# Patient Record
Sex: Female | Born: 1955 | Race: White | Hispanic: No | Marital: Single | Smoking: Never smoker
Health system: Southern US, Community
[De-identification: ages and names within clinical notes are randomized; demographics above are authoritative.]

## PROBLEM LIST (undated history)

## (undated) DIAGNOSIS — J45909 Unspecified asthma, uncomplicated: Secondary | ICD-10-CM

## (undated) DIAGNOSIS — J189 Pneumonia, unspecified organism: Secondary | ICD-10-CM

## (undated) DIAGNOSIS — F4389 Other reactions to severe stress: Secondary | ICD-10-CM

## (undated) DIAGNOSIS — K219 Gastro-esophageal reflux disease without esophagitis: Secondary | ICD-10-CM

## (undated) DIAGNOSIS — J209 Acute bronchitis, unspecified: Secondary | ICD-10-CM

## (undated) DIAGNOSIS — F438 Other reactions to severe stress: Secondary | ICD-10-CM

## (undated) DIAGNOSIS — F70 Mild intellectual disabilities: Secondary | ICD-10-CM

## (undated) DIAGNOSIS — D649 Anemia, unspecified: Secondary | ICD-10-CM

## (undated) HISTORY — DX: Other reactions to severe stress: F43.8

## (undated) HISTORY — DX: Pneumonia, unspecified organism: J18.9

## (undated) HISTORY — DX: Gastro-esophageal reflux disease without esophagitis: K21.9

## (undated) HISTORY — DX: Unspecified asthma, uncomplicated: J45.909

## (undated) HISTORY — DX: Mild intellectual disabilities: F70

## (undated) HISTORY — DX: Other reactions to severe stress: F43.89

## (undated) HISTORY — PX: TONSILLECTOMY: SUR1361

## (undated) HISTORY — DX: Acute bronchitis, unspecified: J20.9

## (undated) HISTORY — DX: Anemia, unspecified: D64.9

---

## 2000-06-24 ENCOUNTER — Emergency Department (HOSPITAL_COMMUNITY): Admission: EM | Admit: 2000-06-24 | Discharge: 2000-06-24 | Payer: Self-pay | Admitting: Emergency Medicine

## 2000-06-24 ENCOUNTER — Encounter: Payer: Self-pay | Admitting: Emergency Medicine

## 2001-08-17 ENCOUNTER — Emergency Department (HOSPITAL_COMMUNITY): Admission: EM | Admit: 2001-08-17 | Discharge: 2001-08-17 | Payer: Self-pay

## 2001-10-04 ENCOUNTER — Emergency Department (HOSPITAL_COMMUNITY): Admission: EM | Admit: 2001-10-04 | Discharge: 2001-10-04 | Payer: Self-pay | Admitting: *Deleted

## 2002-07-16 ENCOUNTER — Emergency Department (HOSPITAL_COMMUNITY): Admission: EM | Admit: 2002-07-16 | Discharge: 2002-07-16 | Payer: Self-pay | Admitting: Emergency Medicine

## 2002-07-16 ENCOUNTER — Encounter: Payer: Self-pay | Admitting: Emergency Medicine

## 2002-11-09 ENCOUNTER — Emergency Department (HOSPITAL_COMMUNITY): Admission: EM | Admit: 2002-11-09 | Discharge: 2002-11-09 | Payer: Self-pay | Admitting: Emergency Medicine

## 2002-12-29 ENCOUNTER — Inpatient Hospital Stay (HOSPITAL_COMMUNITY): Admission: EM | Admit: 2002-12-29 | Discharge: 2003-01-01 | Payer: Self-pay

## 2003-01-02 ENCOUNTER — Encounter: Payer: Self-pay | Admitting: *Deleted

## 2003-01-02 ENCOUNTER — Emergency Department (HOSPITAL_COMMUNITY): Admission: EM | Admit: 2003-01-02 | Discharge: 2003-01-02 | Payer: Self-pay | Admitting: *Deleted

## 2003-07-25 ENCOUNTER — Emergency Department (HOSPITAL_COMMUNITY): Admission: EM | Admit: 2003-07-25 | Discharge: 2003-07-25 | Payer: Self-pay | Admitting: Emergency Medicine

## 2003-08-28 ENCOUNTER — Emergency Department (HOSPITAL_COMMUNITY): Admission: EM | Admit: 2003-08-28 | Discharge: 2003-08-28 | Payer: Self-pay | Admitting: *Deleted

## 2003-09-04 ENCOUNTER — Emergency Department (HOSPITAL_COMMUNITY): Admission: EM | Admit: 2003-09-04 | Discharge: 2003-09-04 | Payer: Self-pay

## 2004-02-23 ENCOUNTER — Emergency Department (HOSPITAL_COMMUNITY): Admission: EM | Admit: 2004-02-23 | Discharge: 2004-02-23 | Payer: Self-pay | Admitting: Emergency Medicine

## 2004-05-24 ENCOUNTER — Inpatient Hospital Stay (HOSPITAL_COMMUNITY): Admission: EM | Admit: 2004-05-24 | Discharge: 2004-05-26 | Payer: Self-pay | Admitting: Emergency Medicine

## 2004-11-19 ENCOUNTER — Emergency Department (HOSPITAL_COMMUNITY): Admission: EM | Admit: 2004-11-19 | Discharge: 2004-11-19 | Payer: Self-pay | Admitting: Emergency Medicine

## 2004-11-20 ENCOUNTER — Inpatient Hospital Stay (HOSPITAL_COMMUNITY): Admission: EM | Admit: 2004-11-20 | Discharge: 2004-11-24 | Payer: Self-pay | Admitting: Emergency Medicine

## 2004-12-30 ENCOUNTER — Emergency Department (HOSPITAL_COMMUNITY): Admission: AC | Admit: 2004-12-30 | Discharge: 2004-12-31 | Payer: Self-pay

## 2005-07-01 ENCOUNTER — Ambulatory Visit: Payer: Self-pay | Admitting: Internal Medicine

## 2005-07-29 ENCOUNTER — Emergency Department (HOSPITAL_COMMUNITY): Admission: EM | Admit: 2005-07-29 | Discharge: 2005-07-29 | Payer: Self-pay | Admitting: Emergency Medicine

## 2007-09-25 ENCOUNTER — Inpatient Hospital Stay (HOSPITAL_COMMUNITY): Admission: EM | Admit: 2007-09-25 | Discharge: 2007-09-27 | Payer: Self-pay | Admitting: Emergency Medicine

## 2007-10-02 ENCOUNTER — Ambulatory Visit: Payer: Self-pay | Admitting: Pulmonary Disease

## 2007-10-17 ENCOUNTER — Ambulatory Visit (HOSPITAL_COMMUNITY): Admission: RE | Admit: 2007-10-17 | Discharge: 2007-10-17 | Payer: Self-pay | Admitting: Obstetrics and Gynecology

## 2007-10-26 ENCOUNTER — Ambulatory Visit: Payer: Self-pay | Admitting: Gastroenterology

## 2007-11-13 ENCOUNTER — Ambulatory Visit: Payer: Self-pay | Admitting: Gastroenterology

## 2007-11-16 ENCOUNTER — Ambulatory Visit: Payer: Self-pay | Admitting: Gastroenterology

## 2008-02-18 ENCOUNTER — Emergency Department (HOSPITAL_COMMUNITY): Admission: EM | Admit: 2008-02-18 | Discharge: 2008-02-18 | Payer: Self-pay | Admitting: Emergency Medicine

## 2008-02-20 ENCOUNTER — Telehealth (INDEPENDENT_AMBULATORY_CARE_PROVIDER_SITE_OTHER): Payer: Self-pay | Admitting: *Deleted

## 2008-02-20 DIAGNOSIS — D509 Iron deficiency anemia, unspecified: Secondary | ICD-10-CM

## 2008-02-20 DIAGNOSIS — F70 Mild intellectual disabilities: Secondary | ICD-10-CM | POA: Insufficient documentation

## 2008-02-20 DIAGNOSIS — D649 Anemia, unspecified: Secondary | ICD-10-CM

## 2008-02-20 DIAGNOSIS — J189 Pneumonia, unspecified organism: Secondary | ICD-10-CM | POA: Insufficient documentation

## 2008-02-20 DIAGNOSIS — K219 Gastro-esophageal reflux disease without esophagitis: Secondary | ICD-10-CM | POA: Insufficient documentation

## 2008-02-20 DIAGNOSIS — J45998 Other asthma: Secondary | ICD-10-CM | POA: Insufficient documentation

## 2008-02-20 DIAGNOSIS — F438 Other reactions to severe stress: Secondary | ICD-10-CM | POA: Insufficient documentation

## 2008-02-20 DIAGNOSIS — J209 Acute bronchitis, unspecified: Secondary | ICD-10-CM

## 2008-02-27 ENCOUNTER — Telehealth (INDEPENDENT_AMBULATORY_CARE_PROVIDER_SITE_OTHER): Payer: Self-pay | Admitting: *Deleted

## 2008-03-25 ENCOUNTER — Emergency Department (HOSPITAL_COMMUNITY): Admission: EM | Admit: 2008-03-25 | Discharge: 2008-03-25 | Payer: Self-pay | Admitting: Emergency Medicine

## 2008-03-31 ENCOUNTER — Telehealth (INDEPENDENT_AMBULATORY_CARE_PROVIDER_SITE_OTHER): Payer: Self-pay | Admitting: *Deleted

## 2008-03-31 DIAGNOSIS — J309 Allergic rhinitis, unspecified: Secondary | ICD-10-CM | POA: Insufficient documentation

## 2008-04-01 ENCOUNTER — Encounter: Payer: Self-pay | Admitting: Internal Medicine

## 2008-05-01 ENCOUNTER — Ambulatory Visit: Payer: Self-pay | Admitting: Internal Medicine

## 2008-05-15 ENCOUNTER — Ambulatory Visit: Payer: Self-pay | Admitting: Internal Medicine

## 2008-07-07 ENCOUNTER — Encounter: Payer: Self-pay | Admitting: Internal Medicine

## 2009-02-25 ENCOUNTER — Telehealth (INDEPENDENT_AMBULATORY_CARE_PROVIDER_SITE_OTHER): Payer: Self-pay | Admitting: *Deleted

## 2009-02-26 ENCOUNTER — Ambulatory Visit: Payer: Self-pay | Admitting: Internal Medicine

## 2009-03-05 ENCOUNTER — Telehealth (INDEPENDENT_AMBULATORY_CARE_PROVIDER_SITE_OTHER): Payer: Self-pay | Admitting: *Deleted

## 2009-03-23 ENCOUNTER — Telehealth (INDEPENDENT_AMBULATORY_CARE_PROVIDER_SITE_OTHER): Payer: Self-pay | Admitting: *Deleted

## 2009-03-26 ENCOUNTER — Ambulatory Visit: Payer: Self-pay | Admitting: Internal Medicine

## 2010-01-08 ENCOUNTER — Telehealth: Payer: Self-pay | Admitting: Adult Health

## 2010-01-18 ENCOUNTER — Telehealth (INDEPENDENT_AMBULATORY_CARE_PROVIDER_SITE_OTHER): Payer: Self-pay | Admitting: *Deleted

## 2010-03-25 ENCOUNTER — Ambulatory Visit: Payer: Self-pay | Admitting: Internal Medicine

## 2011-01-27 NOTE — Assessment & Plan Note (Signed)
Summary: f/u 1 yr///kp   CC:  Yearly follow up visit-no complaints..  History of Present Illness:  5/09--Tthis patient returns for follow-up visit, last seen in 2006.  She says she was hospitalized twice with pneumonia and twice with asthma.  At least some of that was just emergency room visits, but she is not  very clear in her description.  Her transportation is limited.  On March 31 she went to the emergency room and was treated with prednisone and bronchodilators.  She is a never smoker with no family doctor, and she lives alone in an apartment.  Her roommate has left.  There was a cat that is gone.  She denies heartburn.  She says she feels fine today.  She likes a Combivent inhaler, and I think uses it too heavily.  We discussed this and we discussed maintenance versus bronchodilator therapy.  She has run out of protonix,  Avelox and Ventolin HFA, which were most recently prescribed medications.  03/27/23, 2010--Presents for an acute office visit. Complains of 4 days of SOB- chest tightness- wheezing , Cough is mainly dry, feels dry w/ intermittent wheezing. Denies chest pain,  orthopnea, hemoptysis, fever, n/v/d, edema.  April 23, 2009- Asthma, allergic rhinitis Felt well after last here, except she finds easier dyspnea on exertion walking long distances. Not much cough or wheeze. Uses rescue inhaler a lot. If she wakes during night and notices wheeze she will use it.    March 25, 2010- Asthma, allergic rhinitis, learning disability Nonsmoker. No hosp or major problems in past year. She remains compliant with Symbicort and uses Combivent rescue inhaler rarely, but both are mainly used on as needed basis.  Denies acute change, cough, phlegm, chest pain, palpitation.  Current Medications (verified): 1)  Combivent 103-18 Mcg/act  Aero (Ipratropium-Albuterol) .... 2 Puffs, 4 Times Daily As Needed 2)  Pantoprazole Sodium 40 Mg  Tbec (Pantoprazole Sodium) .... Take 1 Tablet By Mouth Once A Day 3)   Symbicort 80-4.5 Mcg/act  Aero (Budesonide-Formoterol Fumarate) .... Two Puffs Twice Daily  Allergies (verified): No Known Drug Allergies  Past History:  Past Surgical History: Last updated: 02/20/2008 tonsillectomy  Family History: Last updated: 04/23/2009 Father -died colon cancer Sister has asthma  Social History: Last updated: 03/25/2010 Patient never smoked.  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  lives alone [employer] grad Disabled, not working  Review of Systems      See HPI  The patient denies anorexia, fever, weight loss, weight gain, vision loss, decreased hearing, hoarseness, chest pain, syncope, dyspnea on exertion, peripheral edema, prolonged cough, headaches, hemoptysis, and severe indigestion/heartburn.    Vital Signs:  Patient profile:   55 year old female Height:      62.5 inches Weight:      128.38 pounds BMI:     23.19 O2 Sat:      97 % on Room air Pulse rate:   74 / minute BP sitting:   130 / 84  (right arm) Cuff size:   regular  Vitals Entered By: Reynaldo Minium CMA (March 25, 2010 2:33 PM)  O2 Flow:  Room air  Physical Exam  Additional Exam:  General: A/Ox3; pleasant and cooperative, NAD, seems slow. SKIN: no rash, lesions NODES: no lymphadenopathy HEENT:  [place]/AT, EOM- WNL, Conjuctivae- clear, PERRLA, TM-WNL, Nose- clear, Throat- clear and wnl, Mallampati  II NECK: Supple w/ fair ROM, JVD- none, normal carotid impulses w/o bruits Thyroid- CHEST: Clear to P&A, unlabored, no wheeze or cough HEART: RRR, no m/g/r heard ABDOMEN:  NWG:NFAO, nl pulses, no edema  NEURO: Grossly intact to  observation      Impression & Recommendations:  Problem # 1:  ASTHMA (ICD-493.90) Well controlled. I re-emphasized role of Symbicort as a maintenance med for daily use. I think she drifts into as needed use but manages ok. I want to keep her treatment uncomplicated.  Problem # 2:  ALLERGIC RHINITIS (ICD-477.9) Not yet a major problem this early in the Spring.  Other Orders: Est. Patient Level II ([address])  Patient Instructions: 1)  Please schedule a follow-up appointment in 1 year. 2)  Refill scripts for : 3)  Combivent rescue inhaler: 2 puffs, up to 4 times daily If Needed 4)  Symbicort (red) : 2 puffs then rinse mouth, every morning and every evening  ( Maintenance controller) Prescriptions: SYMBICORT 80-4.5 MCG/ACT  AERO (BUDESONIDE-FORMOTEROL FUMARATE) Two puffs twice daily  #1 x prn   Entered and Authorized by:   [name] MD   Signed by:   [name] MD on 03/25/2010   Method used:   Electronically to        [facility] (retail)       [address]       [place], [place]  [id number]       Ph: [phone]       Fax: [phone]   RxID:   [id number] COMBIVENT 103-18 MCG/ACT  AERO (IPRATROPIUM-ALBUTEROL) 2 puffs, 4 times daily as needed  #1 x prn   Entered and Authorized by:   [name] MD   Signed by:   [name] MD on 03/25/2010   Method used:   Electronically to        [facility] (retail)       [address]       [place], [place]  [id number]       Ph: [phone]       Fax: [phone]   RxID:   [id number]

## 2011-01-27 NOTE — Progress Notes (Signed)
Summary: Protonix refill  Phone Note Call from Patient   Caller: Patient Call For: YOUNG Summary of Call: NEED REFILL FOR PROTONIX [facility] [address] Initial call taken by: Rickard Patience,  January 18, 2010 11:00 AM  Follow-up for Phone Call        Refills sent to pharmacy. Pt aware.Michel Bickers CMA  January 18, 2010 11:23 AM    Prescriptions: PANTOPRAZOLE SODIUM 40 MG  TBEC (PANTOPRAZOLE SODIUM) Take 1 tablet by mouth once a day  #30 Tablet x 3   Entered by:   Michel Bickers CMA   Authorized by:   [name] MD   Signed by:   Michel Bickers CMA on 01/18/2010   Method used:   [facility]        [facility] [address] [facility]       [address]       [place], [place]  [address]       Ph: [phone] or [phone]       Fax: [phone]   RxID:   [id number]

## 2011-01-27 NOTE — Progress Notes (Signed)
Summary: rx  Phone Note Call from Patient Call back at Home Phone [phone]   Caller: Patient Call For: Eris Breck Reason for Call: Talk to Nurse Summary of Call: protonix not strong enough and she says she  is running out.  wants you to call in higher dosage.  Not really sure what pt was saying. Initial call taken by: Eugene Gavia,  January 08, 2010 10:17 AM  Follow-up for Phone Call        from what I understand pt is needing more refills so [facility] Carron Curie CMA  January 08, 2010 10:36 AM     Prescriptions: PANTOPRAZOLE SODIUM 40 MG  TBEC (PANTOPRAZOLE SODIUM) Take 1 tablet by mouth once a day  #30 Tablet x 5   Entered by:   Carron Curie CMA   Authorized by:   [name] MD   Signed by:   Carron Curie CMA on 01/08/2010   Method used:   [facility]  [place] [address] [facility]       [address]       [place], [place]  [address]       Ph: [phone] or [phone]       Fax: [phone]   RxID:   [id number]

## 2011-03-08 ENCOUNTER — Telehealth (INDEPENDENT_AMBULATORY_CARE_PROVIDER_SITE_OTHER): Payer: Self-pay | Admitting: *Deleted

## 2011-03-15 NOTE — Progress Notes (Signed)
Summary: flutter valve---RX sent  Phone Note Call from Patient Call back at Home Phone [phone]   Caller: Patient Call For: young Summary of Call: pt requests an "acapella" (flutter valve) to help her move air in lungs. says this worked well when she was in the hospital as it helped her "burp a lot". [facility] Initial call taken by: Tivis Ringer, CNA,  March 08, 2011 10:58 AM  Follow-up for Phone Call        spoke with pt and she is requesting a new rx for a flutter valve. She staets the one she has is old and is no longer working properly. Pt last seen 02-2010, but she has a f/u set for 04-21-11. Please advise if ok to send rx. Carron Curie CMA  March 08, 2011 11:07 AM   Additional Follow-up for Phone Call Additional follow up Details #1::        I have put script on med list. Ok to send. won't go through electronically Additional Follow-up by: [name] MD,  March 08, 2011 1:01 PM    Additional Follow-up for Phone Call Additional follow up Details #2::    Pt is aware of new RX for flutter valve and directions for use. [facility] [address].Michel Bickers CMA  March 08, 2011 2:32 PM  New/Updated Medications: * FLUTTER VALVE 3 -5 blows through, three times a day to clear phlegm Prescriptions: FLUTTER VALVE 3 -5 blows through, three times a day to clear phlegm  #1 x prn   Entered by:   Michel Bickers CMA   Authorized by:   [name] MD   Signed by:   Michel Bickers CMA on 03/08/2011   Method used:   Telephoned to ...       [facility] (retail)       [address]       [place], [place]  [id number]       Ph: [phone]       Fax: [phone]   RxID:   [id number] FLUTTER VALVE 3 -5 blows through, three times a day to clear phlegm  #1 x prn   Entered and Authorized by:   [name] MD   Signed by:   [name] MD on 03/08/2011   Method used:   Historical   RxID:   [id number]

## 2011-04-19 ENCOUNTER — Encounter: Payer: Self-pay | Admitting: Internal Medicine

## 2011-04-21 ENCOUNTER — Ambulatory Visit: Payer: Self-pay | Admitting: Internal Medicine

## 2011-04-27 ENCOUNTER — Other Ambulatory Visit: Payer: Self-pay | Admitting: Internal Medicine

## 2011-05-10 NOTE — H&P (Signed)
[name] [name], [name] [name]               ACCOUNT NO.:  [id number]   MEDICAL RECORD NO.:  [id number]          PATIENT TYPE:  EMS   LOCATION:  ED                           FACILITY:  [name]   PHYSICIAN:  Lonia Blood, M.D.       DATE OF BIRTH:  [birth year]   DATE OF ADMISSION:  09/24/2007  DATE OF DISCHARGE:                              HISTORY & PHYSICAL   PRIMARY [name] PHYSICIAN:  [name] [name]   CHIEF COMPLAINT:  Dyspnea.   HISTORY OF PRESENT ILLNESS:  [name] [name] is a 55 year old woman with  history of asthma who was brought to the emergency room tonight by her  sister for complaints of shortness of breath, coughing, and chills.  The  patient reported that everything started kind of abruptly on September 24, 2007.  The patient denies any exposure to any chemicals, and she  denies any sick contacts.   PAST MEDICAL HISTORY:  1. Asthma.  2. Mild mental retardation.   HOME MEDICATIONS:  None.   SOCIAL HISTORY:  The patient lives alone.  She is unemployed.  She is  drawing social security disability.  She does smoke cigarettes, does not  drink alcohol.   FAMILY HISTORY:  The patient's mother is alive with hypertension.  The  patient has a sister who is alive and has asthma.  The patient's father  passed away with cancer.   ALLERGIES:  No known drug allergies.   REVIEW OF SYSTEMS:  As per HPI.  All other systems are negative.   PHYSICAL EXAMINATION UPON ADMISSION:  VITAL SIGNS:  Shows the  temperature 102.1, blood pressure 115/74, pulse 119, respirations 24,  saturation 88% on room air.  GENERAL APPEARANCE:  A well-developed, well-nourished woman sitting on  the stretcher currently in no acute distress.  HEENT:  The head appears normocephalic, atraumatic.  Eyes pupils are  equal and round react to light and accommodation.  Extraocular movements  are intact.  Throat is clear.  NECK:  Supple.  No JVD.  CHEST:  Good sounds bilaterally.  No wheezes, no crackles, no rhonchi.  HEART:  Regular rate and rhythm without murmurs, rubs, or gallop.  ABDOMEN:  The patient's abdomen is soft, nontender.  Bowel sounds are  present.  EXTREMITIES:  Lower extremities without edema.  SKIN:  Warm and dry without any rashes.   LABORATORY VALUES:  At the time of admission ABG shows a pH of 7.50,  pCO2 of 28, pO2 of 81, bicarb 21.  White blood cell count 10.7,  hemoglobin 10.7, platelet count 269.  Sodium 138, potassium 3, chloride  105, bicarb 23, BUN 13, creatinine 0.8, calcium 9.3.  Chest x-ray shows  maybe left lower lobe consolidation.   ASSESSMENT/PLAN:  Pneumonia and hyperreactive airway.  [name] [name] will  be admitted to [name].  She will be placed on intravenous  steroids as well as nebulizers and antibiotics. We will choose  ceftriaxone and  azithromycin to cover for typical and atypical  bacterial pathogens. Careful monitoring of the patient's respiratory  status will be done.  Lonia Blood, M.D.  Electronically Signed     SL/MEDQ  D:  09/25/2007  T:  09/25/2007  Job:  [id number]

## 2011-05-10 NOTE — Assessment & Plan Note (Signed)
[facility]                             PULMONARY OFFICE NOTE   NAME:[name] [name] BUITRON                      MRN:          [id number]  DATE:10/02/2007                            DOB:          13-Oct-1956    HISTORY OF PRESENT ILLNESS:  The patient is a 55 -year-old white female  patient of Dr.  Roxy Cedar who has a known history of asthma, allergic  rhinitis and gastroesophageal reflux who presents today for post  hospitalization visit. The patient was admittedSeptember 29, 2008  through September 27, 2007 for left lower lobe pneumonia and asthmatic  bronchitic exacerbation. The patient was started on IV antibiotics and  steroids along with nebulized bronchodilators. She was admitted through  [facility] hospitalists.  The patient's discharge summary is  unfortunately not available in the computer today. The patient reports  that she was discharged on prednisone and antibiotics. She has a couple  of days left. She reports that she is much improved with decreased  cough, congestion and wheezing. The patient denies any hemoptysis,  orthopnea, PND or leg swelling.   PAST MEDICAL HISTORY:  Reviewed.   CURRENT MEDICATIONS:  Reviewed.   PHYSICAL EXAMINATION:  The patient is a pleasant female in no acute  distress. She is afebrile with stable vital signs. O2 saturation is 96%  on room air.  HEENT: Unremarkable.  NECK: Supple without cervical adenopathy. No JVD.  LUNGS: Lung sounds reveal diminished breath sounds in the bases without  any wheezing or crackles.  CARDIAC: Regular rate.  ABDOMEN: Soft and nontender.  EXTREMITIES: Warm without any calf tenderness, cyanosis, clubbing or  edema.   IMPRESSION/PLAN:  Recent hospitalization with a left lower lobe  pneumonia and asthmatic bronchitic exacerbation. The patient is to  finish antibiotic and prednisone. May use Mucinex DM as needed for cough  control. The patient may also use Combivent inhaler as needed.  Prescription refill was given. The patient is to return back with Dr.  Maple Hudson within four weeks with followup chest x-ray or sooner as needed.     Rubye Oaks, NP  Electronically Signed      Clinton D. Maple Hudson, MD, Tonny Bollman, FACP  Electronically Signed   TP/MedQ  DD: 10/02/2007  DT: 10/02/2007  Job #: [id number]

## 2011-05-10 NOTE — Assessment & Plan Note (Signed)
 HEALTHC[facility]                         GASTROENTEROLOGY OFFICE NOTE   NAME:[name], [name] BLINN                      MRN:          [id number]  DATE:10/26/2007                            DOB:          05/31/1956    REASON FOR REFERRAL:  Dr. Earlene Plater asked me to evaluate [name] [name] in  consultation regarding iron deficiency anemia.   HISTORY OF PRESENT ILLNESS:  [name] [name] is a very pleasant 55 year old  who was recently admitted for an asthma exacerbation.  Following that,  she presented for routine outpatient labs performed by Dr. Louanna Raw.  He found her hemoglobin to be 11.4, RBW was slightly elevated, and her  iron was 30, which is low.  Her percent saturation was 7, which is also  quite low.  Her MCV was normal.  Complete metabolic profile was normal.  She has never seen overt GI bleeding.  She recently did FOBT testing, I  believe through her gynecologist, and has not yet heard the results of  those.  She has no dramatic constipation or diarrhea.  She does have  intermittently heavy periods.   REVIEW OF SYSTEMS:  Essentially normal, and is available on her nursing  intake sheet.   PAST MEDICAL HISTORY:  Asthma.   CURRENT MEDICATIONS:  Pantoprazole, Proair, Combivent.   ALLERGIES:  NO KNOWN DRUG ALLERGIES.   SOCIAL HISTORY:  Single.  Lives alone.  Nonsmoker.  Nondrinker.   FAMILY HISTORY:  No colon cancer or colon polyps in family.   PHYSICAL EXAMINATION:  She is 5 feet 3 inches, 136 pounds.  Blood  pressure 98/70.  Pulse 76.  CONSTITUTIONAL:  Generally well appearing.  NEUROLOGIC:  Alert and oriented x3.  EYES:  Extraocular movements intact.  MOUTH:  Oropharynx moist.  No lesions.  NECK:  Supple.  No lymphadenopathy.  CARDIOVASCULAR:  Heart regular rate and rhythm.  LUNGS:  Clear to auscultation bilaterally.  ABDOMEN:  Soft, nontender, non-distended, normal bowel sounds.  EXTREMITIES:  No lower extremity edema.  SKIN:  No rashes or  lesions on visible extremities.   ASSESSMENT AND PLAN:  A 55 year old woman with recent iron deficiency  anemia.   She does have intermittently heavy periods, and that is probably at  least contributing to her iron deficiency.  That being said, I think we  should proceed with full colonoscopy at her soonest convenience to rule  out significant neoplasms in her colon.  She will be due anyway for  routine screening in 4 to 5 months' time when she turns 52.  We will,  therefore, arrange for her to have full colonoscopy performed at her  soonest convenience.  I see no reason for any further blood tests or  imaging studies prior to that.     Rachael Fee, MD  Electronically Signed    DPJ/MedQ  DD: 10/26/2007  DT: 10/27/2007  Job #: [id number]   cc:   Louanna Raw

## 2011-05-13 NOTE — Discharge Summary (Signed)
NAME:  [name] [name], [name] [name]                         ACCOUNT NO.:  [id number]   MEDICAL RECORD NO.:  [id number]                   PATIENT TYPE:  INP   LOCATION:  5737                                 FACILITY:  [facility]   PHYSICIAN:  Corinna L. Lendell Caprice, MD             DATE OF BIRTH:  Sep 12, 1956   DATE OF ADMISSION:  05/23/2004  DATE OF DISCHARGE:  05/26/2004                                 DISCHARGE SUMMARY   DIAGNOSES:  1. Acute bronchitis with asthma exacerbation.  2. Asymptomatic hypotension, resolved.  3. Disabled.   DISCHARGE MEDICATIONS:  1. Azithromycin 250 mg p.o. daily for 2 more days.  2. Prednisone taper.  3. Advair 250/150 one puff b.i.d.  4. Albuterol MDI 2 puffs q.4h. as needed for wheezing or shortness of     breath.  5. Cough suppressant as needed.   FOLLOW UP:  Follow up with primary care physician in once to two weeks.   ACTIVITY:  Ad lib   CONDITION ON DISCHARGE:  Stable.   DIET:  Regular.   HISTORY AND HOSPITAL COURSE:  [name] [name] is a 55 year old mentally retarded  white female who presented to the emergency room with complaints of  shortness of breath, cough, fevers, and chills for several days.  She is a  poor historian and could not give very useful history.  Her primary care  doctor is reportedly Dr. Andria Meuse, although she does not know the first  name or the spelling of the last name. She has a history of asthma and no  smoking history.   On exam, she reportedly has asymptomatic hypotension and hypoxia in the  emergency room, but unfortunately these episodes are not recorded here on  the chart.  However, she did have diffuse expiratory wheezing when Dr.  Tresa Endo evaluated her.  She also had a prolonged expiratory phase.  Chest x-  ray showed bibasilar atelectasis. CT of the chest was negative for PE.  The  pH was 7.51, PO2 70, PCO2 26.  Her potassium was 3.4; otherwise her basic  metabolic panel was unremarkable.  Her CBC was significant for hemoglobin  of  11, hematocrit 33.  Cardiac markers negative, influenza A and B negative.  EKG showed normal sinus rhythm.   The patient was admitted initially the intensive care unit due to the  hypotension and hypoxia.  Apparently she was not symptomatic with this and  looks fairly comfortable.  She was transferred to the floor the day after  admission.  She was initially started on IV azithromycin, and this was  switched to oral azithromycin.  She was started on oxygen, IV fluids, IV  steroids.  The oxygen was weaned off.  IV fluids were stopped without  recurrence of  hypotension.  Also her steroids had been switched to oral formulation.  At  the time of discharge, she was afebrile, had normal oxygen saturation, had  no wheezing,and as feeling much  better, so she was discharged to home in  stable condition.                                                Corinna L. Lendell Caprice, MD    CLS/MEDQ  D:  05/26/2004  T:  05/27/2004  Job:  [id number]

## 2011-05-13 NOTE — Discharge Summary (Signed)
NAMEPEARLEE, ARVIZU               ACCOUNT NO.:  [id number]   MEDICAL RECORD NO.:  [id number]          PATIENT TYPE:  INP   LOCATION:  0382                         FACILITY:  [facility] [place]   PHYSICIAN:  Sherin Quarry, MD      DATE OF BIRTH:  02-01-1956   DATE OF ADMISSION:  11/20/2004  DATE OF DISCHARGE:                                 DISCHARGE SUMMARY   [name] [name] is a 55 year old lady who has mild IQ deficit and past history  of asthma.  She initially presented to the [facility] Emergency Room on  November 26 with dyspnea associated with persistent cough and shortness of  breath.  Prior to being seen by Dr. Ladona Ridgel the patient underwent a CT scan  of the chest and abdomen which showed evidence of bilateral patchy  infiltrates consistent with pneumonia.  The patient's symptoms did not  respond to symptomatic therapy in the emergency room and therefore she was  admitted on November 26.   PHYSICAL EXAMINATION:  (As described by Dr. Derenda Mis.)  VITAL SIGNS:  Temperature 101, blood pressure 107/63, pulse 114,  respirations 32, O2 saturation 97%.  HEENT:  Within normal limits.  CHEST:  Hyperresonant.  There was decreased breath sounds.  There was  decreased air movement with moderate end-expiratory wheezes.  CARDIOVASCULAR:  Normal S1 and S2 without murmurs, rubs, or gallops.  ABDOMEN:  Benign.  NEUROLOGIC:  The patient's cranial nerves, motor, sensory, and cerebellar  testing was normal.  EXTREMITIES:  No evidence of cyanosis or edema.   LABORATORIES:  White count initially 19,100, hemoglobin 10.8.  Sodium 132,  potassium 3.5, creatinine 0.7, BUN 8.  Liver profile was normal.  Urine  pregnancy test was negative.  Urinalysis was normal.  Serial blood cultures  were negative.  Chest x-ray showed minimal segmental atelectasis right base  and infiltrate left lower lobe.  An abdominal ultrasound was done which was  negative.  Apparently, a CT scan of the abdomen and pelvis was done  the  previous day in the emergency room and reportedly these studies were  negative as well.   On admission Dr. Derenda Mis placed the patient on IV fluids in the form  of normal saline 100 mL/hour.  The patient was given albuterol and Atrovent  by nebulization q.4h.  Oxygen was administered to keep O2 saturation 95%.  Azithromycin 500 mg IV q.24h. and Rocephin 1 g IV q.24h. were begun.  Solu-  Medrol 125 mg x1 was given and then the patient was placed on Solu-Medrol 60  mg q.6h.  The patient remained on IV steroids until November 29 when she was  switched to oral prednisone.  At that time she was also switched to oral  antibiotics.  Intravenous fluids were discontinued and the patient ambulated  around the halls several laps with only minimal shortness of breath.  By  November 30 she appeared to be a candidate for discharge.   DISCHARGE DIAGNOSES:  1.  Atypical pneumonia.  2.  History of asthma.  3.  Mild mental retardation.   DISCHARGE MEDICATIONS:  1.  Combivent inhaler  three puffs q.4h. and p.r.n.  2.  Protonix 40 mg daily.  3.  Prednisone 40 mg p.o. daily x3 days, 30 x3, 20 x3, and then stop.  4.  Zithromax 250 mg daily for two additional days.  5.  Ceftin 500 mg b.i.d. for four additional days.   The patient indicates that she has been seeing Dr. Valrie Hart in the  past about her asthma and that she will follow up with his office.   CONDITION ON DISCHARGE:  Good.  Importance of taking all of her medications  was emphasized.  She indicates she will be able to obtain her medications  through use of a Medicaid card.      SY/MEDQ  D:  11/24/2004  T:  11/24/2004  Job:  [id number]   cc:   Durwin Reges., M.D.  [phone] [place]  [place] [address]  Fax: [phone]

## 2011-05-13 NOTE — Discharge Summary (Signed)
   [name], [name] [name]                         ACCOUNT NO.:  [id number]   MEDICAL RECORD NO.:  [id number]                   PATIENT TYPE:  INP   LOCATION:  5021                                 FACILITY:   PHYSICIAN:  Georgianne Fick, M.D.            DATE OF BIRTH:  1956-01-23   DATE OF ADMISSION:  12/29/2002  DATE OF DISCHARGE:  01/01/2003                                 DISCHARGE SUMMARY   ADMISSION DIAGNOSES:  1. Pneumonia.  2. Hypoxia.  3. Chronic issues including learning disability.   DISCHARGE DIAGNOSES:  1. Pneumonia, improved.  2. Asthmatic bronchitis, improving.  3. Chronic issues, stable.   HOSPITAL COURSE:  After being admitted to [facility] with diagnosis  of pneumonia and history of asthma, the patient was started on treatment  using IV Rocephin and Zithromax.  She was ultimately changed over to just IV  Tequin and with good response.  The asthmatic component of her illness was  controlled with a combination of IV steroids as well as Advair Diskus  250/50.  With stabilization of her condition, she is presently ready to go  home and will be changed to oral steroid taper as well as limited dose of  Advair.   DISCHARGE INSTRUCTIONS:  The patient  is to follow up with patient's primary  MD in approximately four weeks.   DISCHARGE MEDICATIONS:  1. Tequin 400 mg p.o. daily for four more days.  2. Pred taper pack x1 as instructed.  3. Advair Diskus 250/50 one inhalation p.o. b.i.d.  4. The patient is to continue all prior medications.                                               Georgianne Fick, M.D.    AR/MEDQ  D:  01/01/2003  T:  01/01/2003  Job:  [id number]

## 2011-05-13 NOTE — H&P (Signed)
[name] [name], [name] [name]               ACCOUNT NO.:  [id number]   MEDICAL RECORD NO.:  [id number]          PATIENT TYPE:  INP   LOCATION:  0103                         FACILITY:  [facility]   PHYSICIAN:  Melissa L. Ladona Ridgel, MD  DATE OF BIRTH:  07-May-1956   DATE OF ADMISSION:  11/20/2004  DATE OF DISCHARGE:                                HISTORY & PHYSICAL   CHIEF COMPLAINT:  Abdominal pain.   PRIMARY CARE PHYSICIAN:  [name].  She states she sees Dr. Andria Meuse but  cannot give me the spelling of his name.  She does have an address, and we  will attempt to obtain his office information.   HISTORY OF PRESENT ILLNESS:  The patient is a 55 year old white female with  past medical history of mild mental handicap who presented to the emergency  room yesterday with shortness of breath.  She was completely evaluated  including a CT of the chest and CT of the abdomen to establish a diagnosis.  She was treated with anti-anxiety medication, nebulizer treatment.  She was  treated for anxiety and gastritis and asked to follow up with her primary  care physician.  During the hospital visit yesterday, CT of the chest  revealed bilateral lung consolidations with some lymphadenopathy which at  the time was not thought to be related to pneumonia.  She was discharged to  home on antibiotics.  The patient does take prednisone for asthma but unable  to determine how often and if she is actively using that.  Ultrasound of the  abdomen was completed also during hospital stay today which shows again  those consolidations and no active intra-abdominal process to explain her  pain.   The patient returned to the emergency room today as stated for further  evaluation of abdominal pain.  She was found to be short of breath, and  chest x-ray shows bilateral atelectasis with possible infiltrate on the  left.  She will, therefore, be admitted for pneumonia and asthma  exacerbation with treatment of her pain most likely  secondary to the  bilateral infiltrate process and her significant coughing.  The patient,  because of her mental handicap, is very difficult to obtain information  from.  She does an excellent job and has a lot of anxiety but obtaining  information is often aggressive and will not speak.  She does relate that  Dr. Andria Meuse took her off nebulizers, and so she has been using her sister's  when she feels she needs them.   REVIEW OF SYSTEMS:  Negative for fever, chills, or sweats.  She does have  abdominal pain which is worse when she coughs.  In between, it does go away.  She has no diarrhea, although in the emergency room, she did have some loose  stools with coughing.  She denied dysuria.  She denied any vaginal burning  or abnormal vagina discharge.  All other Review of Systems are negative.   PAST MEDICAL HISTORY:  Asthma.   PAST SURGICAL HISTORY:  She states is none.   SOCIAL HISTORY:  She denies tobacco, ethanol, or illicit drug use.  She  states she is not sexually active.  Her last period was November 3 and  lasted a week.  She is part-time Actuary.   FAMILY HISTORY:  Mom is living and has hypertension.  Dad is deceased  secondary to cancer.  She has one sister who also has asthma.   ALLERGIES:  No known drug allergies.   MEDICATIONS:  She states she takes prednisone and Advair.   LABORATORY AND X-RAY DATA:  White count 19.1, hemoglobin 10.8, hematocrit  32.7, platelets 233, neutrophils 89%.  Sodium 132, potassium 3.5, chloride  105, CO2 21, BUN 8, creatinine 0.7.  Lipase 15, amylase 54, ethanol is  negative.  Urine drug screen shows barbiturates, although the patient denies  taking any pills and states no one has given her any pills she is unaware  of.  She did not receive any barbiturates in the emergency room yesterday.   CT of the abdomen and pelvis is obtained to further evaluate the abdominal  pain, again showing these bilateral stable consolidations, small amount  of  pelvic fluid, and a prominent cervix with fibroids.   PHYSICAL EXAMINATION:  VITAL SIGNS:  Temperature 101, blood pressure 107/63,  pulse 114, respiratory rate 32, O2 saturation 97% on room air.  GENERAL:  An anxious, ill-appearing female in moderate distress.  HEENT AND NECK:  Normocephalic and atraumatic.  Pupils equal, round, and  reactive to light.  Extraocular muscles are intact.  Mucous membranes are  moist.  There are thick oral secretions and no thyromegaly.  CHEST:  Hyperresonant posteriorly decreased breath sounds at the bases.  She  has decreased air movement posteriorly with occasional end-expiratory  wheeze.  CARDIOVASCULAR:  Tachycardic.  Positive S2 and S3.  No murmurs, rubs, or  gallops.  ABDOMEN:  Diffusely tender with no guarding or rebound.  Fascicular breath  sounds.  No hepatosplenomegaly.  EXTREMITIES:  2+ pulses with no clubbing, cyanosis, or edema.  NEUROLOGIC:  Awake, alert, oriented x 3.  Cranial nerves II-XII intact.  She  does have some learning/mental disabilities and will become withdrawn and  only shake her head yes and no for answers.  She can be prompted to talk,  however, with a lot of tender loving care she will engage. Deep tendon  reflexes are 2+.   Chest x-ray was competed today which again confirms the right lower lobe  atelectasis, right lower lobe possible infiltrate.   IMPRESSION AND PLAN:  This is a 55 year old white female with a history of  asthma, presents with one week of shortness of breath and abdominal pain  with cough.  X-ray and CT show bibasilar infiltrates.  She will be admitted  to telemetry with continuous pulse oximetry for further evaluation of lungs.   1.  Cardiovascular:  She is tachycardic which is sinus secondary to      nebulizer and current pulmonary condition.  We will check an EKG since      November 25 EKG does show some ST segment decrease.  Will rehydrate her     and follow her on telemetry.  If she becomes  more tachycardic, we may      need to use Xopenex breathing treatments.  2.  Pulmonary:  Will do nebulizer x 4 and reevaluate her.  Most likely be      able to transfer her over to q.4h. with p.r.n. nebulizers q.2h.  I would      like the patient to use flutter valve with nebulizers.  She  will be      started on Humibid and IV steroids.  If she becomes more somnolent or      has increased respiratory distress, we may need to use BiPAP.  Will      check an ABG in the morning and another chest x-ray.  3.  Gastrointestinal:  Pain is likely secondary to cough.  CT abdomen and      pelvis is negative as is ultrasound.  For now will treat her with p.r.n.      pain medications, IV Protonix and only give her clear liquids.  4.  Genitourinary:  Her UA is negative.  We will follow her symptomatically.  5.  Endocrine:  Will check a TSH and use sliding scale insulin while on      steroids.  6.  She will need prophylaxis with Lovenox.     Meli   MLT/MEDQ  D:  11/20/2004  T:  11/20/2004  Job:  [id number]

## 2011-05-13 NOTE — H&P (Signed)
NAME:  [name] [name]                         ACCOUNT NO.:  [id number]   MEDICAL RECORD NO.:  [id number]                   PATIENT TYPE:  INP   LOCATION:  5021                                 FACILITY:  [facility]   PHYSICIAN:  Lemmie Evens, M.D.             DATE OF BIRTH:  [birth year]   DATE OF ADMISSION:  12/29/2002  DATE OF DISCHARGE:                                HISTORY & PHYSICAL   HISTORY OF PRESENT ILLNESS:  [name] [name] is a 55 year old white female,  who is learning disabled, living with her mother, who came to the emergency  room today complaining of shortness of breath and persistent cough for the  past 24-48 hours.  She has coughed frequently during this time and produced  clear to yellow sputum.  She has had some tightness in the chest.  When she  was evaluated in the emergency room it was felt that she did have pneumonia  based on an abnormal chest x-ray, as well as a CT scan of the chest.  CT  scan was done to rule out pulmonary emboli.   The patient does have a past medical history of asthma.  Generally, she does  not take medicine routinely for this.   The patient has been followed by Dr. Ellene Route in the community.  Dr.  Tiburcio Pea, apparently, does not have admitting privileges and the patient was  put on the unassigned service with Dr. Nicholos Johns designated as the  admitting physician.   REVIEW OF SYSTEMS:  The patient, in review of systems, does not have  significant skin rash, mouth sores, eye problems, fever, abdominal  discomfort, burning on urination or diarrhea.   The patient's last normal menstrual period was December 2002.   HABITS:  The patient does not smoke cigarettes or drink alcohol.   ALLERGIES:  The patient has had no significant allergy to medicines.   SOCIAL HISTORY:  The patient has had a longstanding history of learning  disability and is unable to carry on full employment because of that.  She  does not drive a car.   PHYSICAL  EXAMINATION:  GENERAL:  The patient appears to be healthy and well-  nourished, although she is somewhat tachypneic.  VITAL SIGNS:  Blood pressure 118/63, temperature 97.2 orally, heart rate is  118, respiratory rate is 28.  Pulse ox on room air was 93%.  EXTREMITIES:  The patient does have good mobility of upper and lower  extremities.  No edema was noted in the lower extremity exam.  SKIN:  Revealed an area of irritation on the extensor elbow.  No rashes  noted on the trunk, face or extremities, with the exception that area of  irritation.  NECK:  No thyromegaly or adenopathy.  LUNGS:  Bilateral rhonchi, without definite wheezing at this time.  HEART:  Regular sinus rhythm, heart rate of 100, no murmur, gallop or rub is  appreciated.  ABDOMEN:  Soft abdomen without hepatosplenomegaly.    ASSESSMENT:  The patient has been admitted for treatment of pneumonia.  She  will be medicated with antibiotic therapy.  Also, Albuterol inhaler will be  used as needed.                                               Lemmie Evens, M.D.    [place] Pink  D:  12/29/2002  T:  12/29/2002  Job:  [id number]

## 2011-06-15 ENCOUNTER — Other Ambulatory Visit: Payer: Self-pay | Admitting: Internal Medicine

## 2011-07-04 ENCOUNTER — Telehealth: Payer: Self-pay | Admitting: Internal Medicine

## 2011-07-04 MED ORDER — PANTOPRAZOLE SODIUM 40 MG PO TBEC: 40.0000 mg | DELAYED_RELEASE_TABLET | Freq: Every day | ORAL | Status: DC

## 2011-07-04 NOTE — Telephone Encounter (Signed)
Called, spoke with pt.  She is requesting a rx for pantoprazole for greater than 30 days so she does not have to call pharmacy every month for refill.  She was last seen by CDY on 02/2010 and states she is out of med.  She is aware OV will be needed bc it has been > 1 yr since last OV.  OV scheduled for July 07, 2011 at 1:30pm.  Pt aware of OV and aware 1 month rx will be sent to pharmacy and she can get 3 month rx at OV.  Pt ok with this plan and verbalized understanding.

## 2011-07-07 ENCOUNTER — Encounter: Payer: Self-pay | Admitting: Internal Medicine

## 2011-07-07 ENCOUNTER — Ambulatory Visit (INDEPENDENT_AMBULATORY_CARE_PROVIDER_SITE_OTHER): Payer: Medicare Other | Admitting: Internal Medicine

## 2011-07-07 VITALS — BP 118/80 | HR 87 | Ht 62.5 in | Wt 127.6 lb

## 2011-07-07 DIAGNOSIS — K219 Gastro-esophageal reflux disease without esophagitis: Secondary | ICD-10-CM

## 2011-07-07 DIAGNOSIS — J45909 Unspecified asthma, uncomplicated: Secondary | ICD-10-CM

## 2011-07-07 MED ORDER — PANTOPRAZOLE SODIUM 40 MG PO TBEC: 40.0000 mg | DELAYED_RELEASE_TABLET | Freq: Every day | ORAL | Status: DC

## 2011-07-07 MED ORDER — IPRATROPIUM-ALBUTEROL 18-103 MCG/ACT IN AERO: 2.0000 | INHALATION_SPRAY | Freq: Four times a day (QID) | RESPIRATORY_TRACT | Status: DC | PRN

## 2011-07-07 MED ORDER — BUDESONIDE-FORMOTEROL FUMARATE 80-4.5 MCG/ACT IN AERO: 2.0000 | INHALATION_SPRAY | Freq: Two times a day (BID) | RESPIRATORY_TRACT | Status: DC

## 2011-07-07 NOTE — Progress Notes (Signed)
  Subjective:    Patient ID: [name] [name], female    DOB: Dec 08, 1956, 55 y.o.   MRN: [id number]  HPI 07/07/11- 49 yoF never smoker, followed for asthma, allergic rhinitis, complicated by hx mild retardation, anemia, GERD. Last here March 25, 2010- note reviewed She has stayed in and avoided the weather extremes. Uses Symbicort each morning, then at night if needed. I am not sure how she uses Combivent. She indicates it doesn't last ? Through a day, ? Through a month?? Asks we write protonix to give 30/ month. Living with sister and mother.  Review of Systems Constitutional:   No weight loss, night sweats,  Fevers, chills, fatigue, lassitude. HEENT:   No headaches,  Difficulty swallowing,  Tooth/dental problems,  Sore throat,                No sneezing, itching, ear ache, nasal congestion, post nasal drip,   CV:  No chest pain,  Orthopnea, PND, swelling in lower extremities, anasarca, dizziness, palpitations  GI  No heartburn, indigestion, abdominal pain, nausea, vomiting, diarrhea, change in bowel habits, loss of appetite  Resp: No shortness of breath with exertion or at rest.  No excess mucus, no productive cough,  No non-productive cough,  No coughing up of blood.  No change in color of mucus.    Skin: no rash or lesions.  GU: no dysuria, change in color of urine, no urgency or frequency.  No flank pain.  MS:  No joint pain or swelling.  No decreased range of motion.  No back pain.  Psych:  No change in mood or affect. No depression or anxiety.  No memory loss.      Objective:   Physical Exam General- Alert, Oriented, Affect-appropriate, Distress- none acute  Alert, pleasant but limited speech skills and insight.  Skin- rash-none, lesions- none, excoriation- none  Lymphadenopathy- none  Head- atraumatic  Eyes- Gross vision intact, PERRLA, conjunctivae clear secretions  Ears- Hearing, canals, Tm- normal  Nose- Clear, No- Septal dev, mucus, polyps, erosion, perforation    Throat- Mallampati II , mucosa clear , drainage- none, tonsils- atrophic  Neck- flexible , trachea midline, no stridor , thyroid nl, carotid no bruit  Chest - symmetrical excursion , unlabored     Heart/CV- RRR , no murmur , no gallop  , no rub, nl s1 s2                     - JVD- none , edema- none, stasis changes- none, varices- none     Lung- clear to P&A, wheeze- none, cough- none , dullness-none, rub- none     Chest wall-  Abd- tender-no, distended-no, bowel sounds-present, HSM- no  Br/ Gen/ Rectal- Not done, not indicated  Extrem- cyanosis- none, clubbing, none, atrophy- none, strength- nl  Neuro- grossly intact to observation         Assessment & Plan:

## 2011-07-07 NOTE — Assessment & Plan Note (Signed)
Good control now. i don't think she is over using her rescue inhaler, but she has trouble expressing just how she takes it.  I will give med refills.

## 2011-07-07 NOTE — Patient Instructions (Signed)
Med scripts refilled   Please call as needed

## 2011-07-07 NOTE — Assessment & Plan Note (Signed)
She asks refill protonix, enough to last a month. I tried to make sure she was only using one daily.

## 2011-10-06 LAB — CBC
HCT: 26.8 — ABNORMAL LOW
HCT: 31.8 — ABNORMAL LOW
Hemoglobin: 8.8 — ABNORMAL LOW
Hemoglobin: 9.1 — ABNORMAL LOW
Hemoglobin: 10.7 — ABNORMAL LOW
MCHC: 34
MCHC: 34.3
MCHC: 33.5
MCV: 82.7
MCV: 82.6
Platelets: 252
RBC: 3.25 — ABNORMAL LOW
RDW: 15.4 — ABNORMAL HIGH
RDW: 15.5 — ABNORMAL HIGH
RDW: 15.2 — ABNORMAL HIGH
WBC: 16.5 — ABNORMAL HIGH

## 2011-10-06 LAB — BASIC METABOLIC PANEL
BUN: 10
BUN: 13
CO2: 22
CO2: 24
CO2: 23
Calcium: 8.7
Calcium: 8.9
Chloride: 112
Chloride: 105
Creatinine, Ser: 0.65
Creatinine, Ser: 0.78
GFR calc Af Amer: >60
GFR calc Af Amer: >60
GFR calc non Af Amer: >60
GFR calc non Af Amer: >60
GFR calc non Af Amer: >60
Glucose, Bld: 121 — ABNORMAL HIGH
Glucose, Bld: 155 — ABNORMAL HIGH
Glucose, Bld: 113 — ABNORMAL HIGH
Potassium: 4.2
Potassium: 3 — ABNORMAL LOW
Sodium: 136
Sodium: 141
Sodium: 138

## 2011-10-06 LAB — DIFFERENTIAL
Basophils Absolute: 0
Basophils Relative: 0
Eosinophils Absolute: 0
Eosinophils Absolute: 0
Eosinophils Relative: 0
Eosinophils Relative: 0
Lymphocytes Relative: 3 — ABNORMAL LOW
Lymphs Abs: 0.9
Monocytes Absolute: 0.8 — ABNORMAL HIGH
Monocytes Relative: 2 — ABNORMAL LOW
Neutrophils Relative %: 93 — ABNORMAL HIGH

## 2011-10-06 LAB — COMPREHENSIVE METABOLIC PANEL
AST: 38 — ABNORMAL HIGH
Albumin: 3.7
Alkaline Phosphatase: 60
BUN: 10
CO2: 21
Chloride: 105
GFR calc Af Amer: >60
Potassium: 3.6
Total Bilirubin: 0.7

## 2011-10-06 LAB — BLOOD GAS, ARTERIAL
Drawn by: 295541
FIO2: 0.28
O2 Saturation: 96.8
Patient temperature: 98.6
pH, Arterial: 7.504 — ABNORMAL HIGH

## 2011-10-06 LAB — CULTURE, BLOOD (ROUTINE X 2)

## 2012-01-03 ENCOUNTER — Other Ambulatory Visit: Payer: Self-pay | Admitting: Internal Medicine

## 2012-01-03 MED ORDER — PANTOPRAZOLE SODIUM 40 MG PO TBEC: 40.0000 mg | DELAYED_RELEASE_TABLET | Freq: Every day | ORAL | Status: DC

## 2012-01-03 NOTE — Telephone Encounter (Signed)
I spoke with pt and advised her protonix rx has been sent to the pharmacy. Nothing further was needed

## 2012-01-25 ENCOUNTER — Telehealth: Payer: Self-pay | Admitting: Internal Medicine

## 2012-01-25 NOTE — Telephone Encounter (Signed)
I spoke with pharmacy and patient-both aware that there have been 3 more refills added to last until patient comes in for follow up in July.

## 2012-03-06 ENCOUNTER — Telehealth: Payer: Self-pay | Admitting: Internal Medicine

## 2012-03-06 MED ORDER — PANTOPRAZOLE SODIUM 40 MG PO TBEC: 40.0000 mg | DELAYED_RELEASE_TABLET | Freq: Every day | ORAL | Status: DC

## 2012-03-06 NOTE — Telephone Encounter (Signed)
Spoke with pt. She is requesting refill on pantoprazole. Rx was sent to pharm and she states nothing further needed.

## 2012-03-26 ENCOUNTER — Emergency Department (HOSPITAL_COMMUNITY)
Admission: EM | Admit: 2012-03-26 | Discharge: 2012-03-26 | Disposition: A | Discharge status: Home / Self Care | Payer: Medicare PPO | Attending: Emergency Medicine | Admitting: Emergency Medicine

## 2012-03-26 ENCOUNTER — Encounter (HOSPITAL_COMMUNITY): Payer: Self-pay | Admitting: *Deleted

## 2012-03-26 DIAGNOSIS — K219 Gastro-esophageal reflux disease without esophagitis: Secondary | ICD-10-CM | POA: Insufficient documentation

## 2012-03-26 DIAGNOSIS — J45909 Unspecified asthma, uncomplicated: Secondary | ICD-10-CM | POA: Insufficient documentation

## 2012-03-26 DIAGNOSIS — H5710 Ocular pain, unspecified eye: Secondary | ICD-10-CM

## 2012-03-26 DIAGNOSIS — H109 Unspecified conjunctivitis: Secondary | ICD-10-CM | POA: Insufficient documentation

## 2012-03-26 MED ORDER — TETRACAINE HCL 0.5 % OP SOLN
OPHTHALMIC | Status: AC
  Administered 2012-03-26: 19:00:00
  Filled 2012-03-26: qty 2

## 2012-03-26 MED ORDER — ERYTHROMYCIN 5 MG/GM OP OINT: TOPICAL_OINTMENT | OPHTHALMIC | Status: AC

## 2012-03-26 MED ORDER — ERYTHROMYCIN 5 MG/GM OP OINT
TOPICAL_OINTMENT | Freq: Once | OPHTHALMIC | Status: AC
  Administered 2012-03-26: 19:00:00 via OPHTHALMIC
  Filled 2012-03-26: qty 1

## 2012-03-26 MED ORDER — FLUORESCEIN SODIUM 1 MG OP STRP
ORAL_STRIP | OPHTHALMIC | Status: AC
  Administered 2012-03-26: 19:00:00
  Filled 2012-03-26: qty 1

## 2012-03-26 NOTE — ED Notes (Signed)
fb in her rt eye.  Something flew into her eye while she was walking outside. Redness

## 2012-03-26 NOTE — Discharge Instructions (Signed)
Conjunctivitis Conjunctivitis is commonly called "pink eye." Conjunctivitis can be caused by bacterial or viral infection, allergies, or injuries. There is usually redness of the lining of the eye, itching, discomfort, and sometimes discharge. There may be deposits of matter along the eyelids. A viral infection usually causes a watery discharge, while a bacterial infection causes a yellowish, thick discharge. Pink eye is very contagious and spreads by direct contact. You may be given antibiotic eyedrops as part of your treatment. Before using your eye medicine, remove all drainage from the eye by washing gently with warm water and cotton balls. Continue to use the medication until you have awakened 2 mornings in a row without discharge from the eye. Do not rub your eye. This increases the irritation and helps spread infection. Use separate towels from other household members. Wash your hands with soap and water before and after touching your eyes. Use cold compresses to reduce pain and sunglasses to relieve irritation from light. Do not wear contact lenses or wear eye makeup until the infection is gone. SEEK MEDICAL CARE IF:   Your symptoms are not better after 3 days of treatment.   You have increased pain or trouble seeing.   The outer eyelids become very red or swollen.  Document Released: 01/19/2005 Document Revised: 12/01/2011 Document Reviewed: 12/12/2005 [facility] Patient Information 2012 [place], [place].  Return for any new or worsening symptoms or any other concerns.

## 2012-03-26 NOTE — ED Notes (Signed)
Visual Acuity: R: 40/20 L:40/20 Both eyes: 40/20

## 2012-03-26 NOTE — ED Provider Notes (Signed)
History     CSN: [id number]  Arrival date & time 03/26/12  1731   First MD Initiated Contact with Patient 03/26/12 1816      Chief Complaint  Patient presents with  . Foreign Body in Eye    (Consider location/radiation/quality/duration/timing/severity/associated sxs/prior treatment) HPI CC FB sensation in R eye, onset this afternoon while pt was walking to the bus stop.  No injury.  Pain is sharp, worse with blinking.  No vision changes.   Past Medical History  Diagnosis Date  . Acute bronchitis   . Esophageal reflux   . Anemia, unspecified   . Mild intellectual disabilities   . Pneumonia, organism unspecified   . Other acute reactions to stress   . Unspecified asthma     Past Surgical History  Procedure Date  . Tonsillectomy     Family History  Problem Relation Age of Onset  . Colon cancer Father   . Asthma Sister     History  Substance Use Topics  . Smoking status: Never Smoker   . Smokeless tobacco: Not on file  . Alcohol Use: Not on file    OB History    Grav Para Term Preterm Abortions TAB SAB Ect Mult Living                  Review of Systems  Eyes: Positive for pain, redness and itching. Negative for discharge and visual disturbance.  All other systems reviewed and are negative.    Allergies  Review of patient's allergies indicates no known allergies.  Home Medications   Current Outpatient Rx  Name Route Sig Dispense Refill  . IPRATROPIUM-ALBUTEROL 18-103 MCG/ACT IN AERO Inhalation Inhale 2 puffs into the lungs every 6 (six) hours as needed for wheezing or shortness of breath. 1 Inhaler prn  . BUDESONIDE-FORMOTEROL FUMARATE 80-4.5 MCG/ACT IN AERO Inhalation Inhale 2 puffs into the lungs 2 (two) times daily. 10.2 g prn  . PANTOPRAZOLE SODIUM 40 MG PO TBEC Oral Take 1 tablet (40 mg total) by mouth daily. 30 tablet 5  . ERYTHROMYCIN 5 MG/GM OP OINT  Place a 1/2 inch ribbon of ointment into the lower eyelid 4 times a day until resolved 3.5 g 0     BP 120/81  Pulse 95  Temp(Src) 98.2 F (36.8 C) (Oral)  Resp 18  SpO2 99%  Physical Exam  Nursing note and vitals reviewed. Constitutional: She appears well-developed and well-nourished.  HENT:  Head: Normocephalic and atraumatic.  Eyes: Lids are normal. Pupils are equal, round, and reactive to light. No foreign bodies found. Right eye exhibits no discharge. No foreign body present in the right eye. Left eye exhibits no discharge. Right conjunctiva is injected. Right conjunctiva has no hemorrhage.  Slit lamp exam:      The right eye shows no corneal abrasion, no corneal flare, no corneal ulcer, no foreign body, no hyphema, no hypopyon, no fluorescein uptake and no anterior chamber bulge.  Cardiovascular: Normal rate, regular rhythm and normal heart sounds.   Pulmonary/Chest: Effort normal and breath sounds normal.  Abdominal: Soft. There is no tenderness.  Skin: Skin is warm and dry.    ED Course  Procedures (including critical care time)  Labs Reviewed - No data to display No results found.   1. Eye pain   2. Conjunctivitis       MDM  Pt is in nad, afvss, nontoxic appearing, exam and hx as above.  SL exam nl, woods lamp nl no abrasion, neg seidels.  Lids everted, no FB noted.  Visual acuity symmetric.  Likely conjunctivitis.  Will d/c with erythromycin ointment.  Pt to f/u with pcp asap.  Return warnings given.        Elijio Miles, MD 03/27/12 [phone]

## 2012-03-27 NOTE — ED Provider Notes (Signed)
I saw and evaluated the patient, reviewed the resident's note and I agree with the findings and plan.  FB sensation to R eye.  No vision changes.  Glynn Octave, MD 03/27/12 [phone]

## 2012-07-06 ENCOUNTER — Ambulatory Visit: Payer: Medicare Other | Admitting: Internal Medicine

## 2012-08-17 ENCOUNTER — Encounter: Payer: Self-pay | Admitting: Internal Medicine

## 2012-08-17 ENCOUNTER — Ambulatory Visit (INDEPENDENT_AMBULATORY_CARE_PROVIDER_SITE_OTHER): Payer: Medicare PPO | Admitting: Internal Medicine

## 2012-08-17 VITALS — BP 118/80 | HR 81 | Ht 62.5 in | Wt 136.2 lb

## 2012-08-17 DIAGNOSIS — K219 Gastro-esophageal reflux disease without esophagitis: Secondary | ICD-10-CM

## 2012-08-17 DIAGNOSIS — J45909 Unspecified asthma, uncomplicated: Secondary | ICD-10-CM

## 2012-08-17 DIAGNOSIS — J309 Allergic rhinitis, unspecified: Secondary | ICD-10-CM

## 2012-08-17 DIAGNOSIS — J45998 Other asthma: Secondary | ICD-10-CM

## 2012-08-17 MED ORDER — ALBUTEROL SULFATE HFA 108 (90 BASE) MCG/ACT IN AERS: 2.0000 | INHALATION_SPRAY | Freq: Four times a day (QID) | RESPIRATORY_TRACT | Status: DC | PRN

## 2012-08-17 MED ORDER — BUDESONIDE-FORMOTEROL FUMARATE 80-4.5 MCG/ACT IN AERO: 2.0000 | INHALATION_SPRAY | Freq: Two times a day (BID) | RESPIRATORY_TRACT | Status: DC

## 2012-08-17 MED ORDER — PANTOPRAZOLE SODIUM 40 MG PO TBEC: 40.0000 mg | DELAYED_RELEASE_TABLET | Freq: Every day | ORAL | Status: DC

## 2012-08-17 NOTE — Patient Instructions (Addendum)
Script refill Symbicort- maintenance everyday inhlaer.   2 puffs, then rinse mouth, twice daily for asthma control  Refill script Protonix - for stomach acid  Once daily before breakfast  New script for albuterol / proair inhaler  2 puffs, up to 4 times daily if needed. This is your short acting rescue inhaler, to use if Symbicort just isn't enough

## 2012-08-17 NOTE — Progress Notes (Signed)
  Subjective:    Patient ID: [name] [name], female    DOB: 1956-09-11, 56 y.o.   MRN: [id number]  HPI 07/07/11- 61 yoF never smoker, followed for asthma, allergic rhinitis, complicated by hx mild retardation, anemia, GERD. Last here March 25, 2010- note reviewed She has stayed in and avoided the weather extremes. Uses Symbicort each morning, then at night if needed. I am not sure how she uses Combivent. She indicates it doesn't last ? Through a day, ? Through a month?? Asks we write protonix to give 30/ month. Living with sister and mother.  08/17/12- 55 yoF never smoker, followed for asthma, allergic rhinitis, complicated by hx mild retardation, anemia, GERD. Says she has done well in the past year. She only uses Symbicort and considers that sufficient. Now lives with sister. Occasional cough or wheeze outdoors.  ROS-see HPI Constitutional:   No-   weight loss, night sweats, fevers, chills, fatigue, lassitude. HEENT:   No-  headaches, difficulty swallowing, tooth/dental problems, sore throat,       No-  sneezing, itching, ear ache, nasal congestion, post nasal drip,  CV:  No-   chest pain, orthopnea, PND, swelling in lower extremities, anasarca, dizziness, palpitations Resp: No-   shortness of breath with exertion or at rest.              No-   productive cough,  + occasional non-productive cough,  No- coughing up of blood.              No-   change in color of mucus. + Rare wheezing.   Skin: No-   rash or lesions. GI:  No-   heartburn, indigestion, abdominal pain, nausea, vomiting, GU:  MS:  No-   joint pain or swelling.   Neuro-     nothing unusual Psych:  No- change in mood or affect. No depression or anxiety.  No memory loss.  OBJ- Physical Exam General- Alert, Oriented, Affect-appropriate, Distress- none acute Skin- rash-none, lesions- none, excoriation- none Lymphadenopathy- none Head- atraumatic            Eyes- Gross vision intact, PERRLA, conjunctivae and secretions clear         Ears- Hearing, canals-normal            Nose- Clear, no-Septal dev, mucus, polyps, erosion, perforation             Throat- Mallampati II , mucosa clear , drainage- none, tonsils- atrophic Neck- flexible , trachea midline, no stridor , thyroid ? Small goiter?, carotid no bruit Chest - symmetrical excursion , unlabored           Heart/CV- RRR , no murmur , no gallop  , no rub, nl s1 s2                           - JVD- none , edema- none, stasis changes- none, varices- none           Lung- clear to P&A, wheeze- none, cough- none , dullness-none, rub- none           Chest wall-  Abd-  Br/ Gen/ Rectal- Not done, not indicated Extrem- cyanosis- none, clubbing, none, atrophy- none, strength- nl Neuro- grossly intact to observation Assessment & Plan:

## 2012-08-25 NOTE — Assessment & Plan Note (Signed)
Generally well controlled with protonix. She requests refill.

## 2012-08-25 NOTE — Assessment & Plan Note (Signed)
Occasional antihistamine should be sufficient.

## 2012-08-25 NOTE — Assessment & Plan Note (Signed)
Very mild intermittent asthma adequately controlled with interval use of Symbicort. She is not needing a rescue inhaler. We discussed options.

## 2012-09-25 ENCOUNTER — Emergency Department (HOSPITAL_COMMUNITY): Payer: Medicare PPO

## 2012-09-25 ENCOUNTER — Inpatient Hospital Stay (HOSPITAL_COMMUNITY)
Admission: EM | Admit: 2012-09-25 | Discharge: 2012-09-29 | DRG: 194 | Disposition: A | Discharge status: Home / Self Care | Payer: Medicare PPO | Attending: Internal Medicine | Admitting: Internal Medicine

## 2012-09-25 DIAGNOSIS — J189 Pneumonia, unspecified organism: Principal | ICD-10-CM | POA: Diagnosis present

## 2012-09-25 DIAGNOSIS — Z79899 Other long term (current) drug therapy: Secondary | ICD-10-CM

## 2012-09-25 DIAGNOSIS — F70 Mild intellectual disabilities: Secondary | ICD-10-CM | POA: Diagnosis present

## 2012-09-25 DIAGNOSIS — D509 Iron deficiency anemia, unspecified: Secondary | ICD-10-CM | POA: Diagnosis present

## 2012-09-25 DIAGNOSIS — J209 Acute bronchitis, unspecified: Secondary | ICD-10-CM | POA: Diagnosis present

## 2012-09-25 DIAGNOSIS — K219 Gastro-esophageal reflux disease without esophagitis: Secondary | ICD-10-CM | POA: Diagnosis present

## 2012-09-25 DIAGNOSIS — J45901 Unspecified asthma with (acute) exacerbation: Secondary | ICD-10-CM | POA: Diagnosis present

## 2012-09-25 DIAGNOSIS — E876 Hypokalemia: Secondary | ICD-10-CM

## 2012-09-25 DIAGNOSIS — F4389 Other reactions to severe stress: Secondary | ICD-10-CM | POA: Diagnosis present

## 2012-09-25 DIAGNOSIS — F438 Other reactions to severe stress: Secondary | ICD-10-CM | POA: Diagnosis present

## 2012-09-25 DIAGNOSIS — J9801 Acute bronchospasm: Secondary | ICD-10-CM

## 2012-09-25 DIAGNOSIS — Z9089 Acquired absence of other organs: Secondary | ICD-10-CM

## 2012-09-25 DIAGNOSIS — D649 Anemia, unspecified: Secondary | ICD-10-CM | POA: Diagnosis present

## 2012-09-25 DIAGNOSIS — F411 Generalized anxiety disorder: Secondary | ICD-10-CM | POA: Diagnosis present

## 2012-09-25 LAB — COMPREHENSIVE METABOLIC PANEL
ALT: 9 U/L (ref 0–35)
Albumin: 3.9 g/dL (ref 3.5–5.2)
Calcium: 9.5 mg/dL (ref 8.4–10.5)
GFR calc Af Amer: >90 mL/min (ref >90)
Glucose, Bld: 140 mg/dL — ABNORMAL HIGH (ref 70–99)
Potassium: 3 mEq/L — ABNORMAL LOW (ref 3.5–5.1)
Sodium: 135 mEq/L (ref 135–145)
Total Protein: 7.4 g/dL (ref 6.0–8.3)

## 2012-09-25 LAB — CBC WITH DIFFERENTIAL/PLATELET
Basophils Relative: 0 % (ref 0–1)
Eosinophils Absolute: 0.1 10*3/uL (ref 0.0–0.7)
Eosinophils Relative: 1 % (ref 0–5)
Lymphs Abs: 1.2 10*3/uL (ref 0.7–4.0)
MCH: 27.4 pg (ref 26.0–34.0)
MCHC: 32.8 g/dL (ref 30.0–36.0)
MCV: 83.6 fL (ref 78.0–100.0)
Neutrophils Relative %: 80 % — ABNORMAL HIGH (ref 43–77)
Platelets: 237 10*3/uL (ref 150–400)
RBC: 3.9 MIL/uL (ref 3.87–5.11)
RDW: 14.1 % (ref 11.5–15.5)

## 2012-09-25 LAB — BLOOD GAS, ARTERIAL
Drawn by: 235321
Patient temperature: 98.6
pCO2 arterial: 31 mmHg — ABNORMAL LOW (ref 35.0–45.0)
pH, Arterial: 7.431 (ref 7.350–7.450)

## 2012-09-25 LAB — PRO B NATRIURETIC PEPTIDE: Pro B Natriuretic peptide (BNP): 165.8 pg/mL — ABNORMAL HIGH (ref 0–125)

## 2012-09-25 LAB — TROPONIN I: Troponin I: <0.3 ng/mL (ref <0.30)

## 2012-09-25 MED ORDER — POTASSIUM CHLORIDE 10 MEQ/100ML IV SOLN
10.0000 meq | INTRAVENOUS | Status: AC
  Administered 2012-09-25 – 2012-09-26 (×3): 10 meq via INTRAVENOUS
  Filled 2012-09-25 (×3): qty 100

## 2012-09-25 MED ORDER — ALBUTEROL SULFATE (5 MG/ML) 0.5% IN NEBU
INHALATION_SOLUTION | RESPIRATORY_TRACT | Status: AC
  Filled 2012-09-25: qty 1

## 2012-09-25 MED ORDER — AZITHROMYCIN 500 MG IV SOLR
500.0000 mg | Freq: Once | INTRAVENOUS | Status: AC
  Administered 2012-09-25: 500 mg via INTRAVENOUS
  Filled 2012-09-25: qty 500

## 2012-09-25 MED ORDER — DEXTROSE 5 % IV SOLN
1.0000 g | Freq: Once | INTRAVENOUS | Status: AC
  Administered 2012-09-25: 1 g via INTRAVENOUS
  Filled 2012-09-25: qty 10

## 2012-09-25 MED ORDER — ALBUTEROL (5 MG/ML) CONTINUOUS INHALATION SOLN
15.0000 mg/h | INHALATION_SOLUTION | Freq: Once | RESPIRATORY_TRACT | Status: AC
  Administered 2012-09-25: 15 mg/h via RESPIRATORY_TRACT

## 2012-09-25 MED ORDER — SODIUM CHLORIDE 0.9 % IV SOLN
INTRAVENOUS | Status: DC
  Administered 2012-09-25: 21:00:00 via INTRAVENOUS

## 2012-09-25 MED ORDER — IPRATROPIUM BROMIDE 0.02 % IN SOLN
0.5000 mg | Freq: Once | RESPIRATORY_TRACT | Status: AC
  Administered 2012-09-25: 0.5 mg via RESPIRATORY_TRACT

## 2012-09-25 MED ORDER — ALBUTEROL SULFATE (5 MG/ML) 0.5% IN NEBU
5.0000 mg | INHALATION_SOLUTION | Freq: Once | RESPIRATORY_TRACT | Status: AC
  Administered 2012-09-25: 5 mg via RESPIRATORY_TRACT

## 2012-09-25 MED ORDER — ALBUTEROL SULFATE (5 MG/ML) 0.5% IN NEBU
INHALATION_SOLUTION | RESPIRATORY_TRACT | Status: AC
  Administered 2012-09-25: 20:00:00
  Filled 2012-09-25: qty 1

## 2012-09-25 MED ORDER — IPRATROPIUM BROMIDE 0.02 % IN SOLN
0.5000 mg | Freq: Once | RESPIRATORY_TRACT | Status: AC
  Administered 2012-09-25: 0.5 mg via RESPIRATORY_TRACT
  Filled 2012-09-25: qty 2.5

## 2012-09-25 MED ORDER — METHYLPREDNISOLONE SODIUM SUCC 125 MG IJ SOLR
125.0000 mg | Freq: Once | INTRAMUSCULAR | Status: AC
  Administered 2012-09-25: 125 mg via INTRAVENOUS
  Filled 2012-09-25: qty 2

## 2012-09-25 MED ORDER — CEFTRIAXONE SODIUM 1 G IJ SOLR: 1.0000 g | Freq: Once | INTRAMUSCULAR | Status: DC

## 2012-09-25 MED ORDER — SODIUM CHLORIDE 0.9 % IV BOLUS (SEPSIS)
500.0000 mL | Freq: Once | INTRAVENOUS | Status: AC
  Administered 2012-09-25: 500 mL via INTRAVENOUS

## 2012-09-25 MED ORDER — ACETAMINOPHEN 325 MG PO TABS
ORAL_TABLET | ORAL | Status: AC
  Administered 2012-09-25: 650 mg
  Filled 2012-09-25: qty 2

## 2012-09-25 MED ORDER — IPRATROPIUM BROMIDE 0.02 % IN SOLN
RESPIRATORY_TRACT | Status: AC
  Filled 2012-09-25: qty 2.5

## 2012-09-25 NOTE — ED Notes (Signed)
Pt unable to tolerate bi-pap. Pt highly anxious and breathing against machine. Pt states "I'm going to die with this on." Bi-pap removed and Dr. Lynelle Doctor notified.

## 2012-09-25 NOTE — ED Provider Notes (Signed)
History     CSN: [id number]  Arrival date & time 09/25/12  1842   First MD Initiated Contact with Patient 09/25/12 1907      Chief Complaint  Patient presents with  . Respiratory Distress  . Agitation   Level V caveat for respiratory distress  (Consider location/radiation/quality/duration/timing/severity/associated sxs/prior treatment) HPI Patient presents to the emergency department via EMS for shortness of breath. Patient's very hard to understand however she relates this is her second episode of having severe respiratory distress. She indicates she is coughing up green and her sister told her she had fever today. Patient indicates she does use oxygen at home.  Pulmonologist Dr. Donnal Debar name her PCP  Past Medical History  Diagnosis Date  . Acute bronchitis   . Esophageal reflux   . Anemia, unspecified   . Mild intellectual disabilities   . Pneumonia, organism unspecified   . Other acute reactions to stress   . Unspecified asthma     Past Surgical History  Procedure Date  . Tonsillectomy     Family History  Problem Relation Age of Onset  . Colon cancer Father   . Asthma Sister     History  Substance Use Topics  . Smoking status: Never Smoker   . Smokeless tobacco: Never Used  . Alcohol Use: No  Lives with sister Lives at home Uses her sisters oxygen On disability "for staying at home".    OB History    Grav Para Term Preterm Abortions TAB SAB Ect Mult Living                  Review of Systems  Unable to perform ROS: Other    Allergies  Review of patient's allergies indicates no known allergies.  Home Medications   Current Outpatient Rx  Name Route Sig Dispense Refill  . ALBUTEROL SULFATE HFA 108 (90 BASE) MCG/ACT IN AERS Inhalation Inhale 2 puffs into the lungs every 6 (six) hours as needed for wheezing or shortness of breath. 1 Inhaler prn  . BUDESONIDE-FORMOTEROL FUMARATE 80-4.5 MCG/ACT IN AERO Inhalation Inhale 2 puffs into  the lungs 2 (two) times daily. And rinse mouth 10.2 g prn  . PANTOPRAZOLE SODIUM 40 MG PO TBEC Oral Take 1 tablet (40 mg total) by mouth daily. 30 tablet prn    BP 110/68  Pulse 120  Resp 25  SpO2 98%  Vital signs normal except tachycardia   Physical Exam  Nursing note and vitals reviewed. Constitutional: She is oriented to person, place, and time. She appears well-developed and well-nourished.  Non-toxic appearance. She does not appear ill. She appears distressed.  HENT:  Head: Normocephalic and atraumatic.  Right Ear: External ear normal.  Left Ear: External ear normal.  Nose: Nose normal. No mucosal edema or rhinorrhea.  Mouth/Throat: Oropharynx is clear and moist and mucous membranes are normal. No dental abscesses or uvula swelling.  Eyes: Conjunctivae normal and EOM are normal. Pupils are equal, round, and reactive to light.  Neck: Normal range of motion and full passive range of motion without pain. Neck supple.  Cardiovascular: Regular rhythm and normal heart sounds.  Tachycardia present.  Exam reveals no gallop and no friction rub.   No murmur heard. Pulmonary/Chest: She is in respiratory distress. She has wheezes. She has no rhonchi. She has no rales. She exhibits no tenderness and no crepitus.       Patient has very poor air movement with diffuse expiratory wheezing and rhonchi. She has retractions.  Patient appears to be in a lot of respiratory distress.  Abdominal: Soft. Normal appearance and bowel sounds are normal. She exhibits no distension. There is no tenderness. There is no rebound and no guarding.  Musculoskeletal: Normal range of motion. She exhibits no edema and no tenderness.       Moves all extremities well.   Neurological: She is alert and oriented to person, place, and time. She has normal strength. No cranial nerve deficit.  Skin: Skin is warm, dry and intact. No rash noted. No erythema. No pallor.       Patient skin is warm to touch  Psychiatric: Her speech  is normal and behavior is normal. Her mood appears not anxious.       Patient seems agitated and anxious.    ED Course  Procedures (including critical care time)   Medications  ipratropium (ATROVENT) 0.02 % nebulizer solution (   Not Given 09/25/12 1924)  albuterol (PROVENTIL) (5 MG/ML) 0.5% nebulizer solution (   Not Given 09/25/12 1923)  0.9 %  sodium chloride infusion (  Intravenous New Bag/Given 09/25/12 2055)  potassium chloride 10 mEq in 100 mL IVPB (10 mEq Intravenous Given 09/25/12 2124)  cefTRIAXone (ROCEPHIN) 1 g in dextrose 5 % 50 mL IVPB (1 g Intravenous Given 09/25/12 2204)  albuterol (PROVENTIL) (5 MG/ML) 0.5% nebulizer solution (   Given by Other 09/25/12 2005)  albuterol (PROVENTIL) (5 MG/ML) 0.5% nebulizer solution 5 mg (5 mg Nebulization Given 09/25/12 1921)  ipratropium (ATROVENT) nebulizer solution 0.5 mg (0.5 mg Nebulization Given 09/25/12 1920)  albuterol (PROVENTIL,VENTOLIN) solution continuous neb (15 mg/hr Nebulization Given 09/25/12 1941)  ipratropium (ATROVENT) nebulizer solution 0.5 mg (0.5 mg Nebulization Given 09/25/12 1941)  azithromycin (ZITHROMAX) 500 mg in dextrose 5 % 250 mL IVPB (500 mg Intravenous Given 09/25/12 2042)  methylPREDNISolone sodium succinate (SOLU-MEDROL) 125 mg/2 mL injection 125 mg (125 mg Intravenous Given 09/25/12 2036)  sodium chloride 0.9 % bolus 500 mL (500 mL Intravenous Given 09/25/12 2055)  acetaminophen (TYLENOL) 325 MG tablet (650 mg  Given 09/25/12 2125)   Patient placed on BiPAP. She was given a continuous nebulizer of albuterol with Atrovent. After reviewing her chest x-ray she was given IV antibiotics and IV steroids. When she was rechecked at 2035 she was still tachycardic with heart rate 125, her blood pressure was 116/70. Her lung exam was improved with better air movement. Shortly after I rechecked her nurses state that she states she couldn't tolerate the BiPAP anymore and removed it. She's currently on nasal cannula oxygen.  Chart  reviewed, no recent hospitalizations.   21:50 Recheck patient is off her BiPAP. Patient states she doesn't feel any better. Patient is noted to be in much less respiratory distress. She still however has very shallow respirations and her wheezing is improved. Patient does not answer questions appropriately. Reviewing her chart she does have some mental impairment. ABG is being done.  22:30 Hijeazi, will see patient.    Results for orders placed during the hospital encounter of 09/25/12  CBC WITH DIFFERENTIAL      Component Value Range   WBC 10.4  4.0 - 10.5 K/uL   RBC 3.90  3.87 - 5.11 MIL/uL   Hemoglobin 10.7 (*) 12.0 - 15.0 g/dL   HCT 16.1 (*) 09.6 - 04.5 %   MCV 83.6  78.0 - 100.0 fL   MCH 27.4  26.0 - 34.0 pg   MCHC 32.8  30.0 - 36.0 g/dL   RDW 40.9  81.1 -  15.5 %   Platelets 237  150 - 400 K/uL   Neutrophils Relative 80 (*) 43 - 77 %   Neutro Abs 8.3 (*) 1.7 - 7.7 K/uL   Lymphocytes Relative 12  12 - 46 %   Lymphs Abs 1.2  0.7 - 4.0 K/uL   Monocytes Relative 7  3 - 12 %   Monocytes Absolute 0.8  0.1 - 1.0 K/uL   Eosinophils Relative 1  0 - 5 %   Eosinophils Absolute 0.1  0.0 - 0.7 K/uL   Basophils Relative 0  0 - 1 %   Basophils Absolute 0.0  0.0 - 0.1 K/uL  COMPREHENSIVE METABOLIC PANEL      Component Value Range   Sodium 135  135 - 145 mEq/L   Potassium 3.0 (*) 3.5 - 5.1 mEq/L   Chloride 100  96 - 112 mEq/L   CO2 22  19 - 32 mEq/L   Glucose, Bld 140 (*) 70 - 99 mg/dL   BUN 14  6 - 23 mg/dL   Creatinine, Ser 1.61  0.50 - 1.10 mg/dL   Calcium 9.5  8.4 - 09.6 mg/dL   Total Protein 7.4  6.0 - 8.3 g/dL   Albumin 3.9  3.5 - 5.2 g/dL   AST 16  0 - 37 U/L   ALT 9  0 - 35 U/L   Alkaline Phosphatase 79  39 - 117 U/L   Total Bilirubin 0.7  0.3 - 1.2 mg/dL   GFR calc non Af Amer >90  >90 mL/min   GFR calc Af Amer >90  >90 mL/min  PRO B NATRIURETIC PEPTIDE      Component Value Range   Pro B Natriuretic peptide (BNP) 165.8 (*) 0 - 125 pg/mL  TROPONIN I      Component Value  Range   Troponin I <0.30  <0.30 ng/mL  BLOOD GAS, ARTERIAL      Component Value Range   O2 Content 2.0     Delivery systems NASAL CANNULA     pH, Arterial 7.431  7.350 - 7.450   pCO2 arterial 31.0 (*) 35.0 - 45.0 mmHg   pO2, Arterial 80.7  80.0 - 100.0 mmHg   Bicarbonate 20.3  20.0 - 24.0 mEq/L   TCO2 18.9  0 - 100 mmol/L   Acid-base deficit 2.9 (*) 0.0 - 2.0 mmol/L   O2 Saturation 95.8     Patient temperature 98.6     Collection site RIGHT RADIAL     Drawn by [id number]     Sample type ARTERIAL DRAW     Allens test (pass/fail) PASS  PASS   Laboratory interpretation all normal except mild anemia  Dg Chest Portable 1 View  09/25/2012  *RADIOLOGY REPORT*  Clinical Data: Respiratory distress.  PORTABLE CHEST - 1 VIEW  Comparison: 03/25/2018  Findings: New airspace opacity in the retrocardiac position obscures the left hemidiaphragm.  This appears primarily confined to the left lower lobe.  The right lobe appears clear.  Cardiac and mediastinal contours appear unremarkable.  IMPRESSION:  1.  Left lower lobe airspace opacity, suspicious for pneumonia although pulmonary hemorrhage could have a similar appearance.   Original Report Authenticated By: Dellia Cloud, M.D.      Date: 09/25/2012  Rate: 124  Rhythm: sinus tachycardia  QRS Axis: normal  Intervals: normal  ST/T Wave abnormalities: nonspecific ST/T changes  Conduction Disutrbances:none  Narrative Interpretation:   Old EKG Reviewed: changes noted  From 11/20/2004, HR was 118 without NSSTTWC  seen today      1. Community acquired pneumonia   2. Bronchospasm   3. Hypokalemia     Plan admission  Devoria Albe, MD, FACEP   CRITICAL CARE Performed by: Devoria Albe L   Total critical care time:40 min  Critical care time was exclusive of separately billable procedures and treating other patients.  Critical care was necessary to treat or prevent imminent or life-threatening deterioration.  Critical care was time spent  personally by me on the following activities: development of treatment plan with patient and/or surrogate as well as nursing, discussions with consultants, evaluation of patient's response to treatment, examination of patient, obtaining history from patient or surrogate, ordering and performing treatments and interventions, ordering and review of laboratory studies, ordering and review of radiographic studies, pulse oximetry and re-evaluation of patient's condition.   MDM           Ward Givens, MD 09/25/12 2236

## 2012-09-25 NOTE — ED Notes (Addendum)
Received report from EMS that pt's sister call them pt was experiencing difficulty breathing and agitated with congested longs. Pt was tachy 132 bmp. Hx of asthma. Pt was on O2 at home but the O2 tank was not working. Pt temp 103.6 tympanic. Albuterol nebulizer x1 was given by EMS. Pt reports coughing up green stuff.

## 2012-09-25 NOTE — H&P (Signed)
[facility]                                                                                    Patient Demographics  [name] [name], is a 56 y.o. female  CSN: [id number]  MRN: [id number]  DOB - 1956-05-09  Admit Date - 09/25/2012  Outpatient Primary MD for the patient is [name], MD   With History of -  Past Medical History  Diagnosis Date  . Acute bronchitis   . Esophageal reflux   . Anemia, unspecified   . Mild intellectual disabilities   . Pneumonia, organism unspecified   . Other acute reactions to stress   . Unspecified asthma       Past Surgical History  Procedure Date  . Tonsillectomy     in for   Chief Complaint  Patient presents with  . Respiratory Distress  . Agitation     HPI  [name] [name]  is a 56 y.o. female, history of mental retardation and asthma presenting today with 3-4 days history of fever, cough with greenish sputum and shortness of breath she denies any chest pains, she denies any nausea, vomiting, abdominal pain or diarrhea. Patient tells me that she's been using the oxygen and neb treatments of her sister. When she came to the emergency room she was on BiPAP however she took it off on her own and her ABGs on 2 L of oxygen by nasal cannula were okay.   Review of Systems    In addition to the HPI above, Positive for Fever-chills, No Headache, No changes with Vision or hearing, No problems swallowing food or Liquids, No Chest pain, positive for cough with greenish sputum and Shortness of Breath, No Abdominal pain, No Nausea or Vommitting, Bowel movements are regular, No Blood in stool or Urine, No dysuria, No new skin rashes or bruises, No new joints pains-aches,  No new weakness, tingling, numbness in any extremity, No recent weight gain or loss, No polyuria, polydypsia or polyphagia, No significant Mental Stressors.  A full 10 point Review of Systems was done, except as stated above, all other Review of  Systems were negative.   Social History History  Substance Use Topics  . Smoking status: Never Smoker   . Smokeless tobacco: Never Used  . Alcohol Use: No    Family History Family History  Problem Relation Age of Onset  . Colon cancer Father   . Asthma Sister     Prior to Admission medications   Medication Sig Start Date End Date Taking? Authorizing Provider  budesonide-formoterol (SYMBICORT) 80-4.5 MCG/ACT inhaler Inhale 2 puffs into the lungs 2 (two) times daily. And rinse mouth 08/17/12 10/28/13 Yes [name], MD    No Known Allergies  Physical Exam  Vitals  Blood pressure 93/56, pulse 111, temperature 102.5 F (39.2 C), temperature source Rectal, resp. rate 23, SpO2 97.00%.   1. General white American female mentally retarded and moderate shortness of breath.  2. Awake Alert,  3. No F.N deficits, ALL C.Nerves Intact, Strength 5/5 all 4 extremities, Sensation intact all 4 extremities, Plantars down going.  4. Ears and Eyes appear Normal, Conjunctivae clear, PERRLA.  Moist Oral Mucosa.  5. Supple Neck, No JVD, No cervical lymphadenopathy appriciated, No Carotid Bruits.  6. Symmetrical Chest wall movement, bilateral coarse rhonchi.  7. RRR, No Gallops, Rubs or Murmurs, No Parasternal Heave.  8. Positive Bowel Sounds, Abdomen Soft, Non tender, No organomegaly appriciated,No rebound -guarding or rigidity.  9.  No Cyanosis, Normal Skin Turgor, No Skin Rash or Bruise.  10. Good muscle tone,  joints appear normal , no effusions, Normal ROM.  11. No Palpable Lymph Nodes in Neck or Axillae   Data Review  CBC  Lab 09/25/12 1943  WBC 10.4  HGB 10.7*  HCT 32.6*  PLT 237  MCV 83.6  MCH 27.4  MCHC 32.8  RDW 14.1  LYMPHSABS 1.2  MONOABS 0.8  EOSABS 0.1  BASOSABS 0.0  BANDABS --   ------------------------------------------------------------------------------------------------------------------  Chemistries   Lab 09/25/12 1943  NA 135  K 3.0*  CL  100  CO2 22  GLUCOSE 140*  BUN 14  CREATININE 0.77  CALCIUM 9.5  MG --  AST 16  ALT 9  ALKPHOS 79  BILITOT 0.7   ------------------------------------------------------------------------------------------------------------------   Coagulation profile No results found for this basename: INR:5,PROTIME:5 in the last 168 hours ------------------------------------------------------------------------------------------------------------------- -------------------------------------------------------------------------------------------------------------------  Cardiac Enzymes  Lab 09/25/12 1943  CKMB --  TROPONINI <0.30  MYOGLOBIN --   ------------------------------------------------------------------------------------------------------------------   ----------------------------------------------------------------------------------------------------------------    Imaging results:   Dg Chest Portable 1 View  09/25/2012  *RADIOLOGY REPORT*  Clinical Data: Respiratory distress.  PORTABLE CHEST - 1 VIEW  Comparison: 03/25/2018  Findings: New airspace opacity in the retrocardiac position obscures the left hemidiaphragm.  This appears primarily confined to the left lower lobe.  The right lobe appears clear.  Cardiac and mediastinal contours appear unremarkable.  IMPRESSION:  1.  Left lower lobe airspace opacity, suspicious for pneumonia although pulmonary hemorrhage could have a similar appearance.   Original Report Authenticated By: Dellia Cloud, M.D.        Assessment & Plan  1. Pneumonia LLL; oxygen, neb treatments, IV Rocephin and Zithromax, check sputum Gram stain culture sensitivity 2. history of asthma; Solu-Medrol IV and neb treatments 3. Mental retardation  DVT Prophylaxis  Lovenox AM Labs Ordered, also please review Full Orders  Family Communication: Admission, patients condition and plan of care including tests being ordered have been discussed with the patient  who indicate understanding and agree with the plan and Code Status.  Code Status full Disposition Plan: Home Time spent in minutes : 36  Condition fair

## 2012-09-25 NOTE — ED Notes (Signed)
[id number]<AV> Expected date:<BR> Expected time: 6:41 PM<BR> Means of arrival:Ambulance<BR> Comments:<BR> *pend to Rm 10 or 11* Wheezing; Hx asthma; fever

## 2012-09-26 ENCOUNTER — Encounter (HOSPITAL_COMMUNITY): Payer: Self-pay | Admitting: Nurse Practitioner

## 2012-09-26 DIAGNOSIS — D649 Anemia, unspecified: Secondary | ICD-10-CM

## 2012-09-26 DIAGNOSIS — J45901 Unspecified asthma with (acute) exacerbation: Secondary | ICD-10-CM

## 2012-09-26 DIAGNOSIS — J189 Pneumonia, unspecified organism: Principal | ICD-10-CM

## 2012-09-26 DIAGNOSIS — K219 Gastro-esophageal reflux disease without esophagitis: Secondary | ICD-10-CM

## 2012-09-26 LAB — CBC
Hemoglobin: 9.4 g/dL — ABNORMAL LOW (ref 12.0–15.0)
MCHC: 33 g/dL (ref 30.0–36.0)
RDW: 14.3 % (ref 11.5–15.5)
WBC: 11.6 10*3/uL — ABNORMAL HIGH (ref 4.0–10.5)

## 2012-09-26 LAB — BASIC METABOLIC PANEL
BUN: 12 mg/dL (ref 6–23)
Chloride: 105 mEq/L (ref 96–112)
Creatinine, Ser: 0.67 mg/dL (ref 0.50–1.10)
GFR calc Af Amer: >90 mL/min (ref >90)
GFR calc non Af Amer: >90 mL/min (ref >90)
Potassium: 3.6 mEq/L (ref 3.5–5.1)

## 2012-09-26 MED ORDER — ALBUTEROL SULFATE (5 MG/ML) 0.5% IN NEBU: 2.5000 mg | INHALATION_SOLUTION | RESPIRATORY_TRACT | Status: DC | PRN

## 2012-09-26 MED ORDER — ONDANSETRON HCL 4 MG PO TABS: 4.0000 mg | ORAL_TABLET | Freq: Four times a day (QID) | ORAL | Status: DC | PRN

## 2012-09-26 MED ORDER — MORPHINE SULFATE 2 MG/ML IJ SOLN: 1.0000 mg | INTRAMUSCULAR | Status: DC | PRN

## 2012-09-26 MED ORDER — HYDROCOD POLST-CHLORPHEN POLST 10-8 MG/5ML PO LQCR
5.0000 mL | Freq: Once | ORAL | Status: AC
  Administered 2012-09-26: 5 mL via ORAL
  Filled 2012-09-26: qty 5

## 2012-09-26 MED ORDER — ONDANSETRON HCL 4 MG/2ML IJ SOLN: 4.0000 mg | Freq: Four times a day (QID) | INTRAMUSCULAR | Status: DC | PRN

## 2012-09-26 MED ORDER — IPRATROPIUM BROMIDE 0.02 % IN SOLN: 0.5000 mg | RESPIRATORY_TRACT | Status: DC | PRN

## 2012-09-26 MED ORDER — METHYLPREDNISOLONE SODIUM SUCC 125 MG IJ SOLR
60.0000 mg | Freq: Four times a day (QID) | INTRAMUSCULAR | Status: DC
  Administered 2012-09-26 – 2012-09-28 (×10): 60 mg via INTRAVENOUS
  Filled 2012-09-26 (×14): qty 0.96

## 2012-09-26 MED ORDER — ZOLPIDEM TARTRATE 5 MG PO TABS: 5.0000 mg | ORAL_TABLET | Freq: Every evening | ORAL | Status: DC | PRN

## 2012-09-26 MED ORDER — INFLUENZA VIRUS VACC SPLIT PF IM SUSP
0.5000 mL | INTRAMUSCULAR | Status: AC
  Administered 2012-09-27: 0.5 mL via INTRAMUSCULAR
  Filled 2012-09-26: qty 0.5

## 2012-09-26 MED ORDER — DEXTROSE 5 % IV SOLN
500.0000 mg | INTRAVENOUS | Status: DC
  Administered 2012-09-26 – 2012-09-28 (×3): 500 mg via INTRAVENOUS
  Filled 2012-09-26 (×4): qty 500

## 2012-09-26 MED ORDER — HYDROCODONE-ACETAMINOPHEN 5-325 MG PO TABS
1.0000 | ORAL_TABLET | ORAL | Status: DC | PRN
  Administered 2012-09-27: 1 via ORAL
  Filled 2012-09-26: qty 1

## 2012-09-26 MED ORDER — GUAIFENESIN ER 600 MG PO TB12
600.0000 mg | ORAL_TABLET | Freq: Two times a day (BID) | ORAL | Status: DC
  Administered 2012-09-26 – 2012-09-29 (×8): 600 mg via ORAL
  Filled 2012-09-26 (×9): qty 1

## 2012-09-26 MED ORDER — DEXTROSE 5 % IV SOLN
1.0000 g | INTRAVENOUS | Status: DC
  Administered 2012-09-26 – 2012-09-28 (×3): 1 g via INTRAVENOUS
  Filled 2012-09-26 (×4): qty 10

## 2012-09-26 MED ORDER — ACETAMINOPHEN 325 MG PO TABS
650.0000 mg | ORAL_TABLET | Freq: Four times a day (QID) | ORAL | Status: DC | PRN
  Administered 2012-09-26 (×2): 650 mg via ORAL
  Filled 2012-09-26 (×2): qty 2

## 2012-09-26 MED ORDER — ENOXAPARIN SODIUM 40 MG/0.4ML ~~LOC~~ SOLN
40.0000 mg | SUBCUTANEOUS | Status: DC
Start: 2012-09-26 — End: 2012-09-29
  Administered 2012-09-26 – 2012-09-28 (×3): 40 mg via SUBCUTANEOUS
  Filled 2012-09-26 (×4): qty 0.4

## 2012-09-26 MED ORDER — ALBUTEROL SULFATE (5 MG/ML) 0.5% IN NEBU
2.5000 mg | INHALATION_SOLUTION | Freq: Four times a day (QID) | RESPIRATORY_TRACT | Status: DC
  Administered 2012-09-26 – 2012-09-28 (×12): 2.5 mg via RESPIRATORY_TRACT
  Filled 2012-09-26 (×12): qty 0.5

## 2012-09-26 MED ORDER — SODIUM CHLORIDE 0.9 % IV SOLN
INTRAVENOUS | Status: DC
  Administered 2012-09-26: 01:00:00 via INTRAVENOUS

## 2012-09-26 MED ORDER — PANTOPRAZOLE SODIUM 40 MG PO TBEC
40.0000 mg | DELAYED_RELEASE_TABLET | Freq: Every day | ORAL | Status: DC
  Administered 2012-09-26 – 2012-09-29 (×4): 40 mg via ORAL
  Filled 2012-09-26 (×4): qty 1

## 2012-09-26 MED ORDER — ACETAMINOPHEN 650 MG RE SUPP: 650.0000 mg | Freq: Four times a day (QID) | RECTAL | Status: DC | PRN

## 2012-09-26 MED ORDER — DOCUSATE SODIUM 100 MG PO CAPS
100.0000 mg | ORAL_CAPSULE | Freq: Two times a day (BID) | ORAL | Status: DC
  Administered 2012-09-26 – 2012-09-29 (×8): 100 mg via ORAL
  Filled 2012-09-26 (×9): qty 1

## 2012-09-26 NOTE — Progress Notes (Addendum)
[facility] PROGRESS NOTE  [name] [name] NWG:[id number] DOB: [birth year] DOA: 09/25/2012 PCP: [name], MD  Brief narrative: 56 year old female with history of asthma, anxiety, acid reflux who presented with working diagnosis of asthma exacerbation and community-acquired pneumonia.  Assessment/Plan:  Principal Problem:  *CAP (community acquired pneumonia) - Will continue current regimen with azithromycin and ceftriaxone - Continue Solu-Medrol 60 mg every 6 hours IV - Bronchodilators every 2 hours as needed for shortness of breath and/or wheezing - Oxygen support via nasal cannula to keep oxygen saturation above 90%  Active Problems:  Acute asthma exacerbation - Solu-Medrol 60 mg every 6 hours IV - bronchodilators as needed and scheduled - Continue oxygen support   GERD - Continue Protonix 40 mg daily by mouth  Code Status: Full code Family Communication: Family is not present at bedside Disposition Plan: Home when stable  [name], MD  [facility] Pager [phone]  If 7PM-7AM, please contact night-coverage [email or web address] Password [email or web address] 09/26/2012, 1:48 PM   LOS: 1 day   Consultants:  None  Procedures:  None  Antibiotics:  Ceftriaxone  Azithromycin  HPI/Subjective: Still feels congested. Continues to cough.  Objective: Filed Vitals:   09/25/12 2300 09/26/12 0016 09/26/12 0626 09/26/12 0752  BP: 92/53 96/57 89/52    Pulse: 109 94 61   Temp:  98.6 F (37 C) 97.6 F (36.4 C)   TempSrc:  Oral Oral   Resp: 27 22 18    Height:  5\' 4"  (1.626 m)    Weight:  61.871 kg (136 lb 6.4 oz)    SpO2: 92% 96% 99% 93%    Intake/Output Summary (Last 24 hours) at 09/26/12 1348 Last data filed at 09/26/12 0928  Gross per 24 hour  Intake 1028.33 ml  Output   1050 ml  Net -21.67 ml    Exam:   General:  Pt is alert, follows commands appropriately, not in acute distress  Cardiovascular: Regular rate and rhythm, S1/S2, no murmurs, no rubs, no  gallops  Respiratory: Bilateral wheezing over mid and upper lung lobes, rhonchi appreciated over upper lung lobes  Abdomen: Soft, non tender, non distended, bowel sounds present, no guarding  Extremities: No edema, pulses DP and PT palpable bilaterally  Neuro: Grossly nonfocal  Data Reviewed: Basic Metabolic Panel:  Lab 09/26/12 2952 09/25/12 1943  NA 137 135  K 3.6 3.0*  CL 105 100  CO2 20 22  GLUCOSE 225* 140*  BUN 12 14  CREATININE 0.67 0.77  CALCIUM 8.6 9.5   Liver Function Tests:  Lab 09/25/12 1943  AST 16  ALT 9  ALKPHOS 79  BILITOT 0.7  PROT 7.4  ALBUMIN 3.9   CBC:  Lab 09/26/12 0330 09/25/12 1943  WBC 11.6* 10.4  NEUTROABS -- 8.3*  HGB 9.4* 10.7*  HCT 28.5* 32.6*  MCV 84.1 83.6  PLT 223 237   Cardiac Enzymes:  Lab 09/25/12 1943  CKTOTAL --  CKMB --  CKMBINDEX --  TROPONINI <0.30    CULTURE, BLOOD (ROUTINE X 2)     Status: Normal (Preliminary result)   Collection Time   09/25/12  7:40 PM      Component Value Range Status Comment   Culture     Final    Value:        BLOOD CULTURE RECEIVED NO GROWTH TO DATE    Report Status PENDING   Incomplete   CULTURE, BLOOD (ROUTINE X 2)     Status: Normal (Preliminary result)   Collection Time   09/25/12  7:43  PM      Component Value Range Status Comment   Culture     Final    Value:        BLOOD CULTURE RECEIVED NO GROWTH TO DATE    Report Status PENDING   Incomplete      Studies: Dg Chest Portable 1 View 09/25/2012  *  IMPRESSION:  1.  Left lower lobe airspace opacity, suspicious for pneumonia although pulmonary hemorrhage could have a similar appearance.     Scheduled Meds:   . albuterol  2.5 mg Nebulization Q6H  . azithromycin  500 mg Intravenous Q24H  . cefTRIAXone   1 g Intravenous Q24H  . docusate sodium  100 mg Oral BID  . enoxaparin (LOVENOX)   40 mg Subcutaneous Q24H  . guaiFENesin  600 mg Oral BID  . ipratropium      . methylPREDNISolone   60 mg Intravenous Q6H  . potassium chloride   10 mEq Intravenous Q1 Hr x 3

## 2012-09-26 NOTE — Progress Notes (Signed)
Inpatient Diabetes Program Recommendations  AACE/ADA: New Consensus Statement on Inpatient Glycemic Control (2013)  Target Ranges:  Prepandial:   less than 140 mg/dL      Peak postprandial:   less than 180 mg/dL (1-2 hours)      Critically ill patients:  140 - 180 mg/dL   Reason for Visit: Note lab glucose the morning was 225 mg/dL.  Patient has no previous history of diabetes however is on IV Solumedrol.  Consider CBG's tid with meals and HS.  If greater than 180 mg/dL, consider moderate Novolog correction tid with meals and HS scale.  Will follow.

## 2012-09-27 DIAGNOSIS — J9801 Acute bronchospasm: Secondary | ICD-10-CM

## 2012-09-27 NOTE — Progress Notes (Signed)
[facility] PROGRESS NOTE  TYKIERA RAVEN ZOX:[id number] DOB: 02-03-1956 DOA: 09/25/2012 PCP: [name], MD  Brief narrative: 56 year old female with history of asthma, anxiety, acid reflux who presented with working diagnosis of asthma exacerbation and community-acquired pneumonia.  Assessment/Plan:   Principal Problem:  *CAP (community acquired pneumonia)  - Will continue zithromycin and ceftriaxone  - We will continue Solu-Medrol 60 mg every 6 hours IV  - Bronchodilators every 2 hours as needed   - Oxygen support via nasal cannula to keep oxygen saturation above 90%   Active Problems:  Acute asthma exacerbation  - Continue Solu-Medrol 60 mg every 6 hours IV  - bronchodilators as needed and scheduled  - Will continue oxygen support   GERD  - Continue Protonix 40 mg daily by mouth   Code Status: Full code  Family Communication: Family is not present at bedside  Disposition Plan: Home when stable   [name], MD  [facility]  Pager [phone]   Consultants:  None Procedures:  None Antibiotics:  Ceftriaxone  Azithromycin  If 7PM-7AM, please contact night-coverage [email or web address] Password [email or web address] 09/27/2012, 11:29 AM   LOS: 2 days   HPI/Subjective: Patient still feels congested.  Objective: Filed Vitals:   09/26/12 2215 09/27/12 0257 09/27/12 0559 09/27/12 0825  BP: 93/61  111/70   Pulse: 91  73   Temp: 98 F (36.7 C)  98.1 F (36.7 C)   TempSrc: Oral  Oral   Resp: 17  16   Height:      Weight:      SpO2: 97% 98% 97% 97%    Intake/Output Summary (Last 24 hours) at 09/27/12 1129 Last data filed at 09/27/12 0600  Gross per 24 hour  Intake    720 ml  Output   1650 ml  Net   -930 ml    Exam:   General:  Pt is alert, follows commands appropriately, not in acute distress  Cardiovascular: Regular rate and rhythm, S1/S2, no murmurs, no rubs, no gallops  Respiratory: Rhonchi and wheezing appreciated over mid and upper lung lobes  bilaterally  Abdomen: Soft, non tender, non distended, bowel sounds present, no guarding  Extremities: No edema, pulses DP and PT palpable bilaterally  Neuro: Grossly nonfocal  Data Reviewed: Basic Metabolic Panel:  Lab 09/26/12 8295 09/25/12 1943  NA 137 135  K 3.6 3.0*  CL 105 100  CO2 20 22  GLUCOSE 225* 140*  BUN 12 14  CREATININE 0.67 0.77  CALCIUM 8.6 9.5   Liver Function Tests:  Lab 09/25/12 1943  AST 16  ALT 9  ALKPHOS 79  BILITOT 0.7  PROT 7.4  ALBUMIN 3.9   CBC:  Lab 09/26/12 0330 09/25/12 1943  WBC 11.6* 10.4  HGB 9.4* 10.7*  HCT 28.5* 32.6*  MCV 84.1 83.6  PLT 223 237   Cardiac Enzymes:  Lab 09/25/12 1943  CKTOTAL --  CKMB --  CKMBINDEX --  TROPONINI <0.30    Recent Results (from the past 240 hour(s))  CULTURE, BLOOD (ROUTINE X 2)     Status: Normal (Preliminary result)   Collection Time   09/25/12  7:40 PM      Component Value Range Status Comment   Specimen Description BLOOD LEFT HAND   Final    Special Requests BOTTLES DRAWN AEROBIC ONLY 3 CC   Final    Culture  Setup Time 09/25/2012 23:29   Final    Culture     Final    Value:  BLOOD CULTURE RECEIVED NO GROWTH TO DATE CULTURE WILL BE HELD FOR 5 DAYS BEFORE ISSUING A FINAL NEGATIVE REPORT   Report Status PENDING   Incomplete   CULTURE, BLOOD (ROUTINE X 2)     Status: Normal (Preliminary result)   Collection Time   09/25/12  7:43 PM      Component Value Range Status Comment   Specimen Description BLOOD RIGHT HAND   Final    Special Requests BOTTLES DRAWN AEROBIC AND ANAEROBIC 4 CC EACH'   Final    Culture  Setup Time 09/25/2012 23:28   Final    Culture     Final    Value:        BLOOD CULTURE RECEIVED NO GROWTH TO DATE CULTURE WILL BE HELD FOR 5 DAYS BEFORE ISSUING A FINAL NEGATIVE REPORT   Report Status PENDING   Incomplete      Studies: Dg Chest Portable 1 View 09/25/2012  *  IMPRESSION:  1.  Left lower lobe airspace opacity, suspicious for pneumonia although pulmonary  hemorrhage could have a similar appearance.       Scheduled Meds:   . albuterol  2.5 mg Nebulization Q6H  . azithromycin  500 mg Intravenous Q24H  . cefTRIAXone (ROCEPHIN)  IV  1 g Intravenous Q24H  . docusate sodium  100 mg Oral BID  . enoxaparin (LOVENOX) injection  40 mg Subcutaneous Q24H  . guaiFENesin  600 mg Oral BID  . methylPREDNISolone (SOLU-MEDROL) injection  60 mg Intravenous Q6H  . pantoprazole  40 mg Oral Q1200

## 2012-09-28 ENCOUNTER — Inpatient Hospital Stay (HOSPITAL_COMMUNITY): Payer: Medicare PPO

## 2012-09-28 MED ORDER — METHYLPREDNISOLONE SODIUM SUCC 125 MG IJ SOLR
60.0000 mg | Freq: Two times a day (BID) | INTRAMUSCULAR | Status: DC
  Administered 2012-09-29 (×2): 60 mg via INTRAVENOUS
  Filled 2012-09-28 (×3): qty 0.96

## 2012-09-28 NOTE — Progress Notes (Signed)
[facility] PROGRESS NOTE  PADDY PANGANIBAN WUJ:[id number] DOB: [birth year] DOA: 09/25/2012 PCP: [name], MD  Brief narrative: 56 year old female with history of asthma, anxiety, acid reflux who presented with working diagnosis of asthma exacerbation and community-acquired pneumonia.   Assessment/Plan:   Principal Problem:  *CAP (community acquired pneumonia)  - Will continue zithromycin and ceftriaxone  - solumedrol 60 mg Q 12 hours - Bronchodilators every 2 hours as needed  - Oxygen support via nasal cannula to keep oxygen saturation above 90%   Active Problems:  Acute asthma exacerbation  - decrease Solu-Medrol  To 60 mg every 12 hours IV  - bronchodilators as needed and scheduled   GERD  - Continue Protonix 40 mg daily by mouth   Code Status: Full code  Family Communication: Family is not present at bedside  Disposition Plan: Home when stable   [name], MD  [facility]  Pager [phone]   Consultants:  None Procedures:  None Antibiotics:  Ceftriaxone  Azithromycin  If 7PM-7AM, please contact night-coverage [email or web address] Password [email or web address] 09/28/2012, 1:33 PM   LOS: 3 days   HPI/Subjective: Still with cough but feels slightly better today.  Objective: Filed Vitals:   09/27/12 2133 09/28/12 0210 09/28/12 0622 09/28/12 0716  BP: 116/79  139/81   Pulse: 91  68   Temp: 97.9 F (36.6 C)  98.2 F (36.8 C)   TempSrc: Oral  Oral   Resp: 17  16   Height:      Weight:      SpO2: 91% 91% 98% 94%    Intake/Output Summary (Last 24 hours) at 09/28/12 1333 Last data filed at 09/28/12 1200  Gross per 24 hour  Intake   1320 ml  Output   2350 ml  Net  -1030 ml    Exam:   General:  Pt is alert, follows commands appropriately, not in acute distress  Cardiovascular: Regular rate and rhythm, S1/S2, no murmurs, no rubs, no gallops  Respiratory: rhonchi diffusely appreciated; wheezing over upper lobes improving  Abdomen: Soft, non tender, non distended,  bowel sounds present, no guarding  Extremities: No edema, pulses DP and PT palpable bilaterally  Neuro: Grossly nonfocal  Data Reviewed: Basic Metabolic Panel:  Lab 09/26/12 8657 09/25/12 1943  NA 137 135  K 3.6 3.0*  CL 105 100  CO2 20 22  GLUCOSE 225* 140*  BUN 12 14  CREATININE 0.67 0.77  CALCIUM 8.6 9.5  MG -- --  PHOS -- --   Liver Function Tests:  Lab 09/25/12 1943  AST 16  ALT 9  ALKPHOS 79  BILITOT 0.7  PROT 7.4  ALBUMIN 3.9   No results found for this basename: LIPASE:5,AMYLASE:5 in the last 168 hours No results found for this basename: AMMONIA:5 in the last 168 hours CBC:  Lab 09/26/12 0330 09/25/12 1943  WBC 11.6* 10.4  NEUTROABS -- 8.3*  HGB 9.4* 10.7*  HCT 28.5* 32.6*  MCV 84.1 83.6  PLT 223 237   Cardiac Enzymes:  Lab 09/25/12 1943  CKTOTAL --  CKMB --  CKMBINDEX --  TROPONINI <0.30   BNP: No components found with this basename: POCBNP:5 CBG: No results found for this basename: GLUCAP:5 in the last 168 hours  Recent Results (from the past 240 hour(s))  CULTURE, BLOOD (ROUTINE X 2)     Status: Normal (Preliminary result)   Collection Time   09/25/12  7:40 PM      Component Value Range Status Comment   Specimen Description BLOOD LEFT  HAND   Final    Special Requests BOTTLES DRAWN AEROBIC ONLY 3 CC   Final    Culture  Setup Time 09/25/2012 23:29   Final    Culture     Final    Value:        BLOOD CULTURE RECEIVED NO GROWTH TO DATE CULTURE WILL BE HELD FOR 5 DAYS BEFORE ISSUING A FINAL NEGATIVE REPORT   Report Status PENDING   Incomplete   CULTURE, BLOOD (ROUTINE X 2)     Status: Normal (Preliminary result)   Collection Time   09/25/12  7:43 PM      Component Value Range Status Comment   Specimen Description BLOOD RIGHT HAND   Final    Special Requests BOTTLES DRAWN AEROBIC AND ANAEROBIC 4 CC EACH'   Final    Culture  Setup Time 09/25/2012 23:28   Final    Culture     Final    Value:        BLOOD CULTURE RECEIVED NO GROWTH TO DATE  CULTURE WILL BE HELD FOR 5 DAYS BEFORE ISSUING A FINAL NEGATIVE REPORT   Report Status PENDING   Incomplete      Studies: No results found.  Scheduled Meds:   . albuterol  2.5 mg Nebulization Q6H  . azithromycin  500 mg Intravenous Q24H  . cefTRIAXone (ROCEPHIN)  IV  1 g Intravenous Q24H  . docusate sodium  100 mg Oral BID  . enoxaparin (LOVENOX) injection  40 mg Subcutaneous Q24H  . guaiFENesin  600 mg Oral BID  . methylPREDNISolone (SOLU-MEDROL) injection  60 mg Intravenous Q6H  . pantoprazole  40 mg Oral Q1200   Continuous Infusions:

## 2012-09-29 LAB — BASIC METABOLIC PANEL
CO2: 27 mEq/L (ref 19–32)
Chloride: 103 mEq/L (ref 96–112)
Glucose, Bld: 149 mg/dL — ABNORMAL HIGH (ref 70–99)
Sodium: 139 mEq/L (ref 135–145)

## 2012-09-29 LAB — CBC
Hemoglobin: 9.5 g/dL — ABNORMAL LOW (ref 12.0–15.0)
Platelets: 269 10*3/uL (ref 150–400)
RBC: 3.45 MIL/uL — ABNORMAL LOW (ref 3.87–5.11)
WBC: 18.4 10*3/uL — ABNORMAL HIGH (ref 4.0–10.5)

## 2012-09-29 MED ORDER — GUAIFENESIN ER 600 MG PO TB12: 600.0000 mg | ORAL_TABLET | Freq: Two times a day (BID) | ORAL | Status: DC

## 2012-09-29 MED ORDER — HYDROCODONE-ACETAMINOPHEN 5-325 MG PO TABS: 1.0000 | ORAL_TABLET | ORAL | Status: DC | PRN

## 2012-09-29 MED ORDER — PANTOPRAZOLE SODIUM 40 MG PO TBEC: 40.0000 mg | DELAYED_RELEASE_TABLET | Freq: Every day | ORAL | Status: DC

## 2012-09-29 MED ORDER — MOXIFLOXACIN HCL 400 MG PO TABS: 400.0000 mg | ORAL_TABLET | Freq: Every day | ORAL | Status: DC

## 2012-09-29 MED ORDER — ALBUTEROL SULFATE (5 MG/ML) 0.5% IN NEBU
2.5000 mg | INHALATION_SOLUTION | Freq: Three times a day (TID) | RESPIRATORY_TRACT | Status: DC
  Administered 2012-09-29: 2.5 mg via RESPIRATORY_TRACT

## 2012-09-29 MED ORDER — PREDNISONE 50 MG PO TABS: ORAL_TABLET | ORAL | Status: DC

## 2012-09-29 MED ORDER — ALBUTEROL SULFATE (5 MG/ML) 0.5% IN NEBU: 2.5000 mg | INHALATION_SOLUTION | RESPIRATORY_TRACT | Status: DC | PRN

## 2012-09-29 MED ORDER — BUDESONIDE-FORMOTEROL FUMARATE 80-4.5 MCG/ACT IN AERO: 2.0000 | INHALATION_SPRAY | Freq: Two times a day (BID) | RESPIRATORY_TRACT | Status: DC

## 2012-09-29 MED ORDER — ZOLPIDEM TARTRATE 5 MG PO TABS: 5.0000 mg | ORAL_TABLET | Freq: Every evening | ORAL | Status: DC | PRN

## 2012-09-29 MED ORDER — ALBUTEROL SULFATE (5 MG/ML) 0.5% IN NEBU
2.5000 mg | INHALATION_SOLUTION | RESPIRATORY_TRACT | Status: DC | PRN
  Filled 2012-09-29: qty 0.5

## 2012-09-29 MED ORDER — IPRATROPIUM BROMIDE 0.02 % IN SOLN: 0.5000 mg | RESPIRATORY_TRACT | Status: DC | PRN

## 2012-09-29 NOTE — Discharge Summary (Signed)
Physician Discharge Summary  [name] [name] RUE:[id number] DOB: [birth year] DOA: 09/25/2012  PCP: [name], MD  Admit date: 09/25/2012 Discharge date: 09/29/2012  Recommendations for Outpatient Follow-up:  1. With PCP upon completion of antibiotic or sooner if symptoms worsen  Discharge Diagnoses:  Principal Problem:  *CAP (community acquired pneumonia) Active Problems:  Acute asthma exacerbation  GERD  Discharge Condition: medically stable for discharge home today   Diet recommendation: as tolerated   History of present illness:  56 year old female with history of asthma, anxiety, acid reflux who presented with working diagnosis of asthma exacerbation and community-acquired pneumonia.   Assessment/Plan:   Principal Problem:  *CAP (community acquired pneumonia)  - Will d/c rocephin and Zithromax - will d/c the patient with avelox for 14 days to ensure clearing of LLL pneumonia which was seen  Again although improving on repeat CXR - solumedrol 60 mg Q 12 hours d/ced and started prednisone 50 mg daily for next 5 days - Bronchodilators every 4-6  hours as needed may be continued at home only if needed and patient does have neb machine at home  Active Problems:  Acute asthma exacerbation  - management as above GERD  - Continue Protonix 40 mg daily by mouth   Code Status: Full code  Family Communication: Family is not present at bedside  Disposition Plan: Home today  [name], MD  [facility]  Pager [phone]   Discharge Exam: Filed Vitals:   09/29/12 0650  BP: 116/79  Pulse: 65  Temp: 98.1 F (36.7 C)  Resp: 20   Filed Vitals:   09/28/12 2201 09/28/12 2316 09/29/12 0650 09/29/12 0750  BP: 131/81  116/79   Pulse: 71  65   Temp: 98 F (36.7 C)  98.1 F (36.7 C)   TempSrc: Oral  Oral   Resp: 16 29 20    Height:      Weight:      SpO2: 94% 98% 99% 96%    General: Pt is alert, follows commands appropriately, not in acute distress Cardiovascular: Regular  rate and rhythm, S1/S2 +, no murmurs, no rubs, no gallops Respiratory: rhonchi still appreciated over left lung but significantly better; no wheezing Abdominal: Soft, non tender, non distended, bowel sounds +, no guarding Extremities: no edema, no cyanosis, pulses palpable bilaterally DP and PT Neuro: Grossly nonfocal  Discharge Instructions  Discharge Orders    Future Appointments: Provider: Department: Dept Phone: Center:   08/19/2013 3:00 PM [name], MD [facility] [phone] None     Future Orders Please Complete By Expires   Diet - low sodium heart healthy      Increase activity slowly      Call MD for:  persistant nausea and vomiting      Call MD for:  severe uncontrolled pain      Call MD for:  difficulty breathing, headache or visual disturbances      Call MD for:  persistant dizziness or light-headedness          Medication List     As of 09/29/2012  8:59 AM    TAKE these medications         albuterol (5 MG/ML) 0.5% nebulizer solution   Commonly known as: PROVENTIL   Take 0.5 mLs (2.5 mg total) by nebulization every 4 (four) hours as needed for wheezing or shortness of breath.      budesonide-formoterol 80-4.5 MCG/ACT inhaler   Commonly known as: SYMBICORT   Inhale 2 puffs into the lungs 2 (  two) times daily. And rinse mouth      calcium carbonate 500 MG chewable tablet   Commonly known as: TUMS - dosed in mg elemental calcium   Chew 1 tablet by mouth daily.      guaiFENesin 600 MG 12 hr tablet   Commonly known as: MUCINEX   Take 1 tablet (600 mg total) by mouth 2 (two) times daily.      HYDROcodone-acetaminophen 5-325 MG per tablet   Commonly known as: NORCO/VICODIN   Take 1-2 tablets by mouth every 4 (four) hours as needed for pain.      ipratropium 0.02 % nebulizer solution   Commonly known as: ATROVENT   Take 2.5 mLs (0.5 mg total) by nebulization every 4 (four) hours as needed for wheezing (or shortness of breath).      moxifloxacin 400  MG tablet   Commonly known as: AVELOX   Take 1 tablet (400 mg total) by mouth daily.      naproxen sodium 220 MG tablet   Commonly known as: ANAPROX   Take 220 mg by mouth 2 (two) times daily with a meal.      pantoprazole 40 MG tablet   Commonly known as: PROTONIX   Take 1 tablet (40 mg total) by mouth daily at 12 noon.      predniSONE 50 MG tablet   Commonly known as: DELTASONE   Please take it for 5 days, 50 mg daily      zolpidem 5 MG tablet   Commonly known as: AMBIEN   Take 1 tablet (5 mg total) by mouth at bedtime as needed for sleep (insomnia).           Follow-up Information    Follow up with [name], MD. In 2 weeks. (or sooner if symptoms worsen)    Contact information:   [address] [facility] [place] [address] [phone]           The results of significant diagnostics from this hospitalization (including imaging, microbiology, ancillary and laboratory) are listed below for reference.    Significant Diagnostic Studies: Dg Chest Port 1 View 09/28/2012  *RADIOLOGY REPORT*  Clinical Data: 56 year old female shortness of breath.  Acute bronchitis.  PORTABLE CHEST - 1 VIEW  Comparison: 09/25/2012 and earlier.  Findings: Portable semi upright AP view 1356 hours.  Continued left lung base confluent pulmonary opacity.  No definite effusion.  Mild improved ventilation since the first.  Cardiac size and mediastinal contours are within normal limits.  Visualized tracheal air column is within normal limits.  No pneumothorax or pulmonary edema.  IMPRESSION: Continued left lower lobe airspace disease, perhaps mildly improved since 09/25/2012. Nonspecific but favor pneumonia.   Original Report Authenticated By: Harley Hallmark, M.D.    Dg Chest Portable 1 View 09/25/2012  *RADIOLOGY REPORT*  Clinical Data: Respiratory distress.  PORTABLE CHEST - 1 VIEW  Comparison: 03/25/2018  Findings: New airspace opacity in the retrocardiac position  obscures the left hemidiaphragm.  This appears primarily confined to the left lower lobe.  The right lobe appears clear.  Cardiac and mediastinal contours appear unremarkable.  IMPRESSION:  1.  Left lower lobe airspace opacity, suspicious for pneumonia although pulmonary hemorrhage could have a similar appearance.   Original Report Authenticated By: Dellia Cloud, M.D.     Microbiology: Recent Results (from the past 240 hour(s))  CULTURE, BLOOD (ROUTINE X 2)     Status: Normal (Preliminary result)   Collection Time  09/25/12  7:40 PM      Component Value Range Status Comment   Specimen Description BLOOD LEFT HAND   Final    Special Requests BOTTLES DRAWN AEROBIC ONLY 3 CC   Final    Culture  Setup Time 09/25/2012 23:29   Final    Culture     Final    Value:        BLOOD CULTURE RECEIVED NO GROWTH TO DATE CULTURE WILL BE HELD FOR 5 DAYS BEFORE ISSUING A FINAL NEGATIVE REPORT   Report Status PENDING   Incomplete   CULTURE, BLOOD (ROUTINE X 2)     Status: Normal (Preliminary result)   Collection Time   09/25/12  7:43 PM      Component Value Range Status Comment   Specimen Description BLOOD RIGHT HAND   Final    Special Requests BOTTLES DRAWN AEROBIC AND ANAEROBIC 4 CC EACH'   Final    Culture  Setup Time 09/25/2012 23:28   Final    Culture     Final    Value:        BLOOD CULTURE RECEIVED NO GROWTH TO DATE CULTURE WILL BE HELD FOR 5 DAYS BEFORE ISSUING A FINAL NEGATIVE REPORT   Report Status PENDING   Incomplete      Labs: Basic Metabolic Panel:  Lab 09/29/12 0865 09/26/12 0330 09/25/12 1943  NA 139 137 135  K 3.8 3.6 3.0*  CL 103 105 100  CO2 27 20 22   GLUCOSE 149* 225* 140*  BUN 13 12 14   CREATININE 0.60 0.67 0.77  CALCIUM 8.8 8.6 9.5  MG -- -- --  PHOS -- -- --   Liver Function Tests:  Lab 09/25/12 1943  AST 16  ALT 9  ALKPHOS 79  BILITOT 0.7  PROT 7.4  ALBUMIN 3.9   No results found for this basename: LIPASE:5,AMYLASE:5 in the last 168 hours No results  found for this basename: AMMONIA:5 in the last 168 hours CBC:  Lab 09/29/12 0343 09/26/12 0330 09/25/12 1943  WBC 18.4* 11.6* 10.4  NEUTROABS -- -- 8.3*  HGB 9.5* 9.4* 10.7*  HCT 29.4* 28.5* 32.6*  MCV 85.2 84.1 83.6  PLT 269 223 237   Cardiac Enzymes:  Lab 09/25/12 1943  CKTOTAL --  CKMB --  CKMBINDEX --  TROPONINI <0.30   BNP: BNP (last 3 results)  Basename 09/25/12 1943  PROBNP 165.8*   CBG: No results found for this basename: GLUCAP:5 in the last 168 hours  Time coordinating discharge: Over 30 minutes  Signed:  [name], MD  [facility] 09/29/2012, 8:59 AM  Pager #: [phone]

## 2012-09-29 NOTE — Discharge Instructions (Signed)

## 2012-10-01 LAB — CULTURE, BLOOD (ROUTINE X 2): Culture: NO GROWTH

## 2012-10-04 ENCOUNTER — Telehealth: Payer: Self-pay | Admitting: Internal Medicine

## 2012-10-04 MED ORDER — BUDESONIDE-FORMOTEROL FUMARATE 80-4.5 MCG/ACT IN AERO: 2.0000 | INHALATION_SPRAY | Freq: Two times a day (BID) | RESPIRATORY_TRACT | Status: DC

## 2012-10-04 NOTE — Telephone Encounter (Signed)
I spoke with pt and she just needed rx sent to the pharmacy to have on hold when she needs refills. i have done so and nothing further was needed

## 2012-11-29 ENCOUNTER — Telehealth: Payer: Self-pay | Admitting: Internal Medicine

## 2012-11-29 NOTE — Telephone Encounter (Signed)
Called and spoke with pt and she stated that she needs refills of the following medications.  Please advise if ok to refill.    Avelox, prednisone, zolpidem, pantoprazole.  Pt stated that she is out of these meds and will need them sent in for refills.  Last ov--08/17/2012 Next ov--08/19/2013  No Known Allergies

## 2012-11-30 NOTE — Telephone Encounter (Signed)
avelox and prednisone were discharge meds from hops pneumonia in October. I need to see her back in post hosp and can discuss then.

## 2012-11-30 NOTE — Telephone Encounter (Signed)
Pt called back and she is aware that per CY---he will need to see her in the office to discuss these meds.  appt has been made for the pt on 12-12 at 3:45.  Nothing further is needed.

## 2012-12-06 ENCOUNTER — Ambulatory Visit (INDEPENDENT_AMBULATORY_CARE_PROVIDER_SITE_OTHER)
Admission: RE | Admit: 2012-12-06 | Discharge: 2012-12-06 | Disposition: A | Discharge status: Home / Self Care | Payer: Medicare PPO | Source: Ambulatory Visit | Attending: Internal Medicine | Admitting: Internal Medicine

## 2012-12-06 ENCOUNTER — Ambulatory Visit (INDEPENDENT_AMBULATORY_CARE_PROVIDER_SITE_OTHER): Payer: Medicare PPO | Admitting: Internal Medicine

## 2012-12-06 ENCOUNTER — Encounter: Payer: Self-pay | Admitting: Internal Medicine

## 2012-12-06 VITALS — BP 110/78 | HR 93 | Ht 62.5 in | Wt 135.0 lb

## 2012-12-06 DIAGNOSIS — G4700 Insomnia, unspecified: Secondary | ICD-10-CM

## 2012-12-06 DIAGNOSIS — J189 Pneumonia, unspecified organism: Secondary | ICD-10-CM

## 2012-12-06 MED ORDER — ZOLPIDEM TARTRATE 5 MG PO TABS: 5.0000 mg | ORAL_TABLET | Freq: Every evening | ORAL | Status: DC | PRN

## 2012-12-06 NOTE — Patient Instructions (Addendum)
Order- CXR  Dx pneumonia   Script to refill ambien sleeping pill. Don't take it every night if you don't need it.  You don't need medicine for the nebulizer machine, prednisone or an antibiotic now.

## 2012-12-06 NOTE — Progress Notes (Signed)
Subjective:    Patient ID: [name] [name], female    DOB: Jan 24, 1956, 55 y.o.   MRN: [id number]  HPI 07/07/11- 60 yoF never smoker, followed for asthma, allergic rhinitis, complicated by hx mild retardation, anemia, GERD. Last here March 25, 2010- note reviewed She has stayed in and avoided the weather extremes. Uses Symbicort each morning, then at night if needed. I am not sure how she uses Combivent. She indicates it doesn't last ? Through a day, ? Through a month?? Asks we write protonix to give 30/ month. Living with sister and mother.  08/17/12- 55 yoF never smoker, followed for asthma, allergic rhinitis, complicated by hx mild retardation, anemia, GERD. Says she has done well in the past year. She only uses Symbicort and considers that sufficient. Now lives with sister. Occasional cough or wheeze outdoors.  12/06/12- 55 yoF never smoker, followed for asthma, allergic rhinitis, complicated by hx mild retardation, anemia, GERD. FOLLOWS FOR: post hospital and discuss meds Hospitalized October 1 through 09/29/2012 with a left lower lobe pneumonia, culture negative, community acquired. She was discharged on prednisone and Avelox which were completed. She asks refills, thinking they are to be continued. She says her insurance won't pay for nebulizer medication through Medicare part D. Her sister has a nebulizer machine and lives with her. She denies active reflux symptoms now. Denies productive cough, chest pain, fever or sweat after leaving hospital. CXR 09/28/12 IMPRESSION:  Continued left lower lobe airspace disease, perhaps mildly improved -reviewed. since 09/25/2012. Nonspecific but favor pneumonia.  Original Report Authenticated By: Harley Hallmark, M.D.   ROS-see HPI Constitutional:   No-   weight loss, night sweats, fevers, chills, fatigue, lassitude. HEENT:   No-  headaches, difficulty swallowing, tooth/dental problems, sore throat,       No-  sneezing, itching, ear ache, nasal  congestion, post nasal drip,  CV:  No-   chest pain, orthopnea, PND, swelling in lower extremities, anasarca, dizziness, palpitations Resp: No-   shortness of breath with exertion or at rest.              No-   productive cough,  + occasional non-productive cough,  No- coughing up of blood.              No-   change in color of mucus. + Rare wheezing.   Skin: No-   rash or lesions. GI:  No-   heartburn, indigestion, abdominal pain, nausea, vomiting, GU:  MS:  No-   joint pain or swelling.   Neuro-     nothing unusual Psych:  No- change in mood or affect. No depression or anxiety.  No memory loss.  OBJ- Physical Exam General- Alert, Oriented, Affect-appropriate, Distress- none acute. Simple and and articulate. Skin- rash-none, lesions- none, excoriation- none Lymphadenopathy- none Head- atraumatic            Eyes- Gross vision intact, PERRLA, conjunctivae and secretions clear            Ears- Hearing, canals-normal            Nose- Clear, no-Septal dev, mucus, polyps, erosion, perforation             Throat- Mallampati II , mucosa clear , drainage- none, tonsils- atrophic Neck- flexible , trachea midline, no stridor , thyroid ? Small goiter?, carotid no bruit Chest - symmetrical excursion , unlabored           Heart/CV- RRR , no murmur , no gallop  , no  rub, nl s1 s2                           - JVD- none , edema- none, stasis changes- none, varices- none           Lung- clear to P&A, wheeze- none, cough- none , dullness-none, rub- none           Chest wall-  Abd-  Br/ Gen/ Rectal- Not done, not indicated Extrem- cyanosis- none, clubbing, none, atrophy- none, strength- nl Neuro- grossly intact to observation Assessment & Plan:

## 2012-12-07 ENCOUNTER — Telehealth: Payer: Self-pay | Admitting: Internal Medicine

## 2012-12-07 NOTE — Telephone Encounter (Signed)
I spoke with pt and she stated she needed a refill on avelox (pt did not know what this medication was). I advised her this was an ABX and per CDY instructions 12/06/12 she did not need an abx as of right now. She voiced her understanding of this and stated she did not realize avelox was an ABX. Nothing further was needed

## 2012-12-14 DIAGNOSIS — G4700 Insomnia, unspecified: Secondary | ICD-10-CM | POA: Insufficient documentation

## 2012-12-14 NOTE — Assessment & Plan Note (Signed)
Clinically resolved. She does not need to continue Avelox and prednisone as she requests. She does not need nebulizer solution now but will continue Symbicort.

## 2012-12-14 NOTE — Assessment & Plan Note (Signed)
Plan-okay to refill sleeping pill for occasional use as discussed.

## 2013-01-10 ENCOUNTER — Ambulatory Visit: Payer: Medicare PPO | Admitting: Internal Medicine

## 2013-03-08 ENCOUNTER — Telehealth: Payer: Self-pay | Admitting: Internal Medicine

## 2013-03-08 MED ORDER — PREDNISONE 10 MG PO TABS: ORAL_TABLET | ORAL | Status: DC

## 2013-03-08 NOTE — Telephone Encounter (Signed)
I spoke with pt and she c/o increase SOB, wheezing, chest tx x couple days. She is requesting prednisone. Please advise Dr. Maple Hudson thanks Last OV 12/06/12 Pending 08/19/13 No Known Allergies

## 2013-03-08 NOTE — Telephone Encounter (Signed)
Per CY-okayto give Prednsione 10 mg #20 take 4 x 2 days, 3 x 2 days, 2 x 2 days, 1 x 2 days, then stop. No refills.

## 2013-03-08 NOTE — Telephone Encounter (Signed)
ATC NA unable to VM WCB RX has been sent

## 2013-03-08 NOTE — Telephone Encounter (Signed)
I spoke with pt and is aware RX has been called in. Nothing further was needed

## 2013-03-25 ENCOUNTER — Emergency Department (HOSPITAL_COMMUNITY)
Admission: EM | Admit: 2013-03-25 | Discharge: 2013-03-25 | Disposition: A | Discharge status: Home / Self Care | Payer: Medicare Other | Attending: Emergency Medicine | Admitting: Emergency Medicine

## 2013-03-25 ENCOUNTER — Emergency Department (HOSPITAL_COMMUNITY): Payer: Medicare Other

## 2013-03-25 ENCOUNTER — Encounter (HOSPITAL_COMMUNITY): Payer: Self-pay | Admitting: Emergency Medicine

## 2013-03-25 DIAGNOSIS — J45909 Unspecified asthma, uncomplicated: Secondary | ICD-10-CM | POA: Insufficient documentation

## 2013-03-25 DIAGNOSIS — J029 Acute pharyngitis, unspecified: Secondary | ICD-10-CM | POA: Insufficient documentation

## 2013-03-25 DIAGNOSIS — Z8659 Personal history of other mental and behavioral disorders: Secondary | ICD-10-CM | POA: Insufficient documentation

## 2013-03-25 DIAGNOSIS — K219 Gastro-esophageal reflux disease without esophagitis: Secondary | ICD-10-CM | POA: Insufficient documentation

## 2013-03-25 DIAGNOSIS — Z8701 Personal history of pneumonia (recurrent): Secondary | ICD-10-CM | POA: Insufficient documentation

## 2013-03-25 DIAGNOSIS — J040 Acute laryngitis: Secondary | ICD-10-CM

## 2013-03-25 DIAGNOSIS — Z862 Personal history of diseases of the blood and blood-forming organs and certain disorders involving the immune mechanism: Secondary | ICD-10-CM | POA: Insufficient documentation

## 2013-03-25 DIAGNOSIS — R1110 Vomiting, unspecified: Secondary | ICD-10-CM | POA: Insufficient documentation

## 2013-03-25 DIAGNOSIS — Z79899 Other long term (current) drug therapy: Secondary | ICD-10-CM | POA: Insufficient documentation

## 2013-03-25 MED ORDER — HYDROCOD POLST-CHLORPHEN POLST 10-8 MG/5ML PO LQCR: 5.0000 mL | Freq: Two times a day (BID) | ORAL | Status: DC | PRN

## 2013-03-25 MED ORDER — DEXAMETHASONE SODIUM PHOSPHATE 10 MG/ML IJ SOLN
10.0000 mg | Freq: Once | INTRAMUSCULAR | Status: AC
  Administered 2013-03-25: 10 mg via INTRAMUSCULAR
  Filled 2013-03-25: qty 1

## 2013-03-25 NOTE — ED Notes (Signed)
Pt reports sore throat, losing voice, productive cough with yellow sputum since Saturday.

## 2013-03-26 ENCOUNTER — Telehealth: Payer: Self-pay | Admitting: Internal Medicine

## 2013-03-26 NOTE — Telephone Encounter (Signed)
Please see additional phone note on patient dated 03-26-13; pt does not want to see CY; would like to see MW.

## 2013-03-26 NOTE — Telephone Encounter (Signed)
I called to speak with pt and spoke with a Marisue Ivan. She stated pt needed nothing right now. She just wanted to schedule pt an apt for this month. Pt was transferred upfront to do so. Nothing further was needed

## 2013-03-26 NOTE — Telephone Encounter (Signed)
Sure - that's fine

## 2013-03-26 NOTE — Telephone Encounter (Signed)
Spoke with [name]-states that she and her neighbors were speaking and stated that patient should switch to MW as he is really good. [name] is aware since Vianka is already a patient of CY's that he and MW will need to agree to the switch.   CY are you okay with patient changing over to MW? If so please document and send to MW to agree/deny. Thanks.

## 2013-03-26 NOTE — Telephone Encounter (Signed)
Mw, please advise if you are okay with taking on patient. Thanks.

## 2013-03-27 NOTE — Telephone Encounter (Signed)
Ok, will need all meds in hand and set up in consult slot

## 2013-03-27 NOTE — Telephone Encounter (Signed)
Spoke with [name] [name] she si aware that patient is now scheduled to see MW as new patient 04/09/13 at 330pm. Aware to arrive 15 min early and to bring all meds nothing further needed at this time

## 2013-03-29 NOTE — ED Provider Notes (Signed)
History     CSN: [id number]  Arrival date & time 03/25/13  1107   First MD Initiated Contact with Patient 03/25/13 1158      Chief Complaint  Patient presents with  . Cough  . Sore Throat  . Emesis     HPI Pt reports sore throat, losing voice, productive cough with yellow sputum since Saturday.  Past Medical History  Diagnosis Date  . Acute bronchitis   . Esophageal reflux   . Anemia, unspecified   . Mild intellectual disabilities   . Pneumonia, organism unspecified   . Other acute reactions to stress   . Unspecified asthma     Past Surgical History  Procedure Laterality Date  . Tonsillectomy      Family History  Problem Relation Age of Onset  . Colon cancer Father   . Asthma Sister     History  Substance Use Topics  . Smoking status: Never Smoker   . Smokeless tobacco: Never Used  . Alcohol Use: No    OB History   Grav Para Term Preterm Abortions TAB SAB Ect Mult Living                  Review of Systems  Constitutional: Negative for fever and chills.  Respiratory: Positive for cough. Negative for shortness of breath.   All other systems reviewed and are negative.    Allergies  Review of patient's allergies indicates no known allergies.  Home Medications   Current Outpatient Rx  Name  Route  Sig  Dispense  Refill  . budesonide-formoterol (SYMBICORT) 80-4.5 MCG/ACT inhaler   Inhalation   Inhale 2 puffs into the lungs 2 (two) times daily. And rinse mouth   10.2 g   3     Place on hold for when ready to refill   . calcium carbonate (TUMS - DOSED IN MG ELEMENTAL CALCIUM) 500 MG chewable tablet   Oral   Chew 1 tablet by mouth daily.         Marland Kitchen guaiFENesin (MUCINEX) 600 MG 12 hr tablet   Oral   Take 1 tablet (600 mg total) by mouth 2 (two) times daily.   60 tablet   0   . ipratropium (ATROVENT) 0.02 % nebulizer solution   Nebulization   Take 2.5 mLs (0.5 mg total) by nebulization every 4 (four) hours as needed for wheezing (or  shortness of breath).   75 mL   0   . naproxen sodium (ANAPROX) 220 MG tablet   Oral   Take 220 mg by mouth 2 (two) times daily with a meal.         . pantoprazole (PROTONIX) 40 MG tablet   Oral   Take 1 tablet (40 mg total) by mouth daily at 12 noon.   30 tablet   0   . predniSONE (DELTASONE) 10 MG tablet      Take 4 tabs daily x 2 days, 3 tabs daily x 2 days, 2 tabs daily x 2 days, 1 tabs daily x 2 days then stop   20 tablet   0   . zolpidem (AMBIEN) 5 MG tablet   Oral   Take 1 tablet (5 mg total) by mouth at bedtime as needed for sleep (insomnia).   30 tablet   5   . albuterol (PROVENTIL) (5 MG/ML) 0.5% nebulizer solution   Nebulization   Take 0.5 mLs (2.5 mg total) by nebulization every 4 (four) hours as needed for wheezing or shortness  of breath.   20 mL   0   . chlorpheniramine-HYDROcodone (TUSSIONEX PENNKINETIC ER) 10-8 MG/5ML LQCR   Oral   Take 5 mLs by mouth every 12 (twelve) hours as needed.   140 mL   0     BP 121/74  Pulse 72  Temp(Src) 98.7 F (37.1 C) (Oral)  Resp 17  SpO2 99%  Physical Exam  Nursing note and vitals reviewed. Constitutional: She is oriented to person, place, and time. She appears well-developed and well-nourished. No distress.  HENT:  Head: Normocephalic and atraumatic.  Right Ear: External ear normal.  Left Ear: External ear normal.  Nose: Nose normal.  Mouth/Throat: Oropharynx is clear and moist. No oropharyngeal exudate.  Eyes: Pupils are equal, round, and reactive to light.  Neck: Normal range of motion. No JVD present. No tracheal deviation present. No thyromegaly present.  Cardiovascular: Normal rate and intact distal pulses.   Pulmonary/Chest: No stridor. No respiratory distress.  Abdominal: Normal appearance. She exhibits no distension.  Musculoskeletal: Normal range of motion.  Lymphadenopathy:    She has no cervical adenopathy.  Neurological: She is alert and oriented to person, place, and time. No cranial  nerve deficit.  Skin: Skin is warm and dry. No rash noted.  Psychiatric: She has a normal mood and affect. Her behavior is normal.    ED Course  Procedures (including critical care time) Medications  dexamethasone (DECADRON) injection 10 mg (10 mg Intramuscular Given 03/25/13 1239)    Labs Reviewed - No data to display No results found.   1. Laryngitis       MDM          Nelia Shi, MD 03/29/13 1144

## 2013-04-09 ENCOUNTER — Encounter: Payer: Self-pay | Admitting: Internal Medicine

## 2013-04-09 ENCOUNTER — Ambulatory Visit (INDEPENDENT_AMBULATORY_CARE_PROVIDER_SITE_OTHER): Payer: Medicare Other | Admitting: Internal Medicine

## 2013-04-09 VITALS — BP 118/78 | HR 93 | Temp 97.1°F | Ht 62.5 in | Wt 141.0 lb

## 2013-04-09 DIAGNOSIS — J45909 Unspecified asthma, uncomplicated: Secondary | ICD-10-CM

## 2013-04-09 MED ORDER — BUDESONIDE-FORMOTEROL FUMARATE 160-4.5 MCG/ACT IN AERO: INHALATION_SPRAY | RESPIRATORY_TRACT | Status: DC

## 2013-04-09 NOTE — Patient Instructions (Addendum)
Prednisone 1 daily x 5 days,  then 1 every other day until seen   Work on inhaler technique:  relax and gently blow all the way out then take a nice smooth deep breath back in, triggering the inhaler at same time you start breathing in.  Hold for up to 5 seconds if you can.  Rinse and gargle with water when done    See [name] NP w/in 2 weeks with all your medications, even over the counter meds, separated in two separate bags, the ones you take no matter what vs the ones you stop once you feel better and take only as needed when you feel you need them.   [name]  will generate for you a new user friendly medication calendar that will put [place] all on the same page re: your medication use.     Without this process, it simply isn't possible to assure that we are providing  your outpatient care  with  the attention to detail we feel you deserve.   If we cannot assure that you're getting that kind of care,  then we cannot manage your problem effectively from this clinic.  Once you have seen [name] and we are sure that we're all on the same page with your medication use she will arrange follow up with me.  Add first check the hfa and if still poor then change to performist/bud neb (needs machine)

## 2013-04-09 NOTE — Progress Notes (Signed)
Subjective:     Patient ID: [name] [name], female   DOB: 04-30-1956, MRN: [id number]  HPI  Brief patient profile:  66 yowf with MR and  longstanding asthma and rhinitis/ GERD followed by Dr Maple Hudson who referred her for pulmonary consultation.     04/09/2013 f/u ov/Emilliano Dilworth 2nd opinion re asthma rx - came with friend who has husband with asthma "you helped him a lot and I want to see what you can do for her" but friend does not really look after pt daily or know her meds Chief Complaint  Patient presents with  . pulmonary consult   still on prednisone ? Dose with very poor hfa technique, very confused with meds and extremely poor hfa technique Sob with more than adls, min mucoid sputum, some better on pred and after saba, not clear how much she uses. Has neb solution but no neb????  No obvious daytime variabilty or assoc chronic cough or cp or chest tightness, subjective wheeze overt sinus   symptoms. No unusual exp hx or h/o childhood pna/ asthma or premature birth to he rknowledge.   Does have h/o gerd ? On ppi daily - not sure.     Review of Systems     Objective:   Physical Exam Wt Readings from Last 3 Encounters:  04/09/13 141 lb (63.957 kg)  12/06/12 135 lb (61.236 kg)  09/26/12 136 lb 6.4 oz (61.871 kg)    Unhealthy appearing thin wf nad, extreme difficulty answering questions specifically although uses lots of medical diagnoses she clearly doesn't understand  HEENT: nl dentition, turbinates, and orophanx. Nl external ear canals without cough reflex   NECK :  without JVD/Nodes/TM/ nl carotid upstrokes bilaterally   LUNGS: no acc muscle use, clear to A and P bilaterally without cough on insp or exp maneuvers   CV:  RRR  no s3 or murmur or increase in P2, no edema   ABD:  soft and nontender with nl excursion in the supine position. No bruits or organomegaly, bowel sounds nl  MS:  warm without deformities, calf tenderness, cyanosis or clubbing  SKIN: warm and dry  without lesions    NEURO:  alert, approp, no deficits     cxr 03/25/13 No acute cardiopulmonary abnormality seen.  Assessment:           Plan:

## 2013-04-10 DIAGNOSIS — J45909 Unspecified asthma, uncomplicated: Secondary | ICD-10-CM | POA: Insufficient documentation

## 2013-04-10 NOTE — Assessment & Plan Note (Signed)
Symptoms are markedly disproportionate to objective findings and not clear this is all a lung problem but pt does appear to have difficult airway management issues. DDX of  difficult airways managment all start with A and  include Adherence, Ace Inhibitors, Acid Reflux, Active Sinus Disease, Alpha 1 Antitripsin deficiency, Anxiety masquerading as Airways dz,  ABPA,  allergy(esp in young), Aspiration (esp in elderly), Adverse effects of DPI,  Active smokers, plus two Bs  = Bronchiectasis and Beta blocker use..and one C= CHF   Adherence is always the initial "prime suspect" and is a multilayered concern that requires a "trust but verify" approach in every patient - starting with knowing how to use medications, especially inhalers, correctly, keeping up with refills and understanding the fundamental difference between maintenance and prns vs those medications only taken for a very short course and then stopped and not refilled. The proper method of use, as well as anticipated side effects, of a metered-dose inhaler are discussed and demonstrated to the patient. Improved effectiveness after extensive coaching during this visit to a level of approximately  25% so if not able to master option is advair vs neb on return  ? Acid reflux > always a concern here and she carries dx of gerd but not clear uses meds appropriately See instructions for specific recommendations which were reviewed directly with the patient who was given a copy with highlighter outlining the key components.  Major struggle with medication reconciliation >  To keep things simple, I have asked the patient to first separate medicines that are perceived as maintenance, that is to be taken daily "no matter what", from those medicines that are taken on only on an as-needed basis and I have given the patient examples of both, and then return to see our NP to generate a  detailed  medication calendar which should be followed until the next physician  sees the patient and updates it.

## 2013-04-19 ENCOUNTER — Ambulatory Visit (INDEPENDENT_AMBULATORY_CARE_PROVIDER_SITE_OTHER): Payer: Medicare HMO | Admitting: Adult Health

## 2013-04-19 ENCOUNTER — Encounter: Payer: Self-pay | Admitting: Adult Health

## 2013-04-19 VITALS — BP 110/76 | HR 78 | Temp 96.8°F | Ht 62.75 in | Wt 137.2 lb

## 2013-04-19 DIAGNOSIS — J45909 Unspecified asthma, uncomplicated: Secondary | ICD-10-CM

## 2013-04-19 MED ORDER — AEROCHAMBER MV MISC: Status: DC

## 2013-04-19 NOTE — Patient Instructions (Addendum)
Remain off Prednisone .  Restart Symbicort 160/4.81mcg 2 puffs Twice daily  , brush [name] [name] after use. Use spacer  Restart Pantoprazole 40mg  daily before meal .  Reschedule with Berda Shelvin NP for med calendar , bring all meds to this visit.  Follow up Dr. Sherene Sires  In 6 weeks .

## 2013-04-19 NOTE — Addendum Note (Signed)
Addended by: Boone Master E on: 04/19/2013 01:01 PM   Modules accepted: Orders

## 2013-04-19 NOTE — Assessment & Plan Note (Addendum)
Frequent flare with AR  Pt education with inhaler teaching given. -added spacer  Advised to return  Medication review   Plan  Remain off Prednisone .  Restart Symbicort 160/4.96mcg 2 puffs Twice daily  , brush [name] [name] after use. Use spacer  Restart Pantoprazole 40mg  daily before meal .  Reschedule with Bret Vanessen NP for med calendar , bring all meds to this visit.  Follow up Dr. Sherene Sires  In 6 weeks .

## 2013-04-19 NOTE — Progress Notes (Signed)
Subjective:     Patient ID: [name] [name], female   DOB: 1956-03-20, MRN: [id number]  HPI  Brief patient profile:  19 yowf with MR and  longstanding asthma and rhinitis/ GERD followed by Dr Maple Hudson who referred her for pulmonary consultation.  04/09/2013 f/u ov/Wert 2nd opinion re asthma rx - came with friend who has husband with asthma "you helped him a lot and I want to see what you can do for her" but friend does not really look after pt daily or know her meds Chief Complaint  Patient presents with  . pulmonary consult   still on prednisone ? Dose with very poor hfa technique, very confused with meds and extremely poor hfa technique Sob with more than adls, min mucoid sputum, some better on pred and after saba, not clear how much she uses. Has neb solution but no neb????  No obvious daytime variabilty or assoc chronic cough or cp or chest tightness, subjective wheeze overt sinus   symptoms. No unusual exp hx or h/o childhood pna/ asthma or premature birth to he rknowledge.   Does have h/o gerd ? On ppi daily - not sure.   04/19/2013 Follow up and med review   Returns for follow up and med review  Did not bring her meds to office visit. Unclear what she is taking.  Has run out of prednisone .  Feels breathing is about the same . With rare cough . Has wheezing on /off.  Not taking symbicort on regular basis. Inhaler teaching in office with emphasis on maintenance vs As needed  meds .  Needs refill of Protoinix .   Review of Systems Constitutional:   No  weight loss, night sweats,  Fevers, chills, fatigue, or  lassitude.  HEENT:   No headaches,  Difficulty swallowing,  Tooth/dental problems, or  Sore throat,                No sneezing, itching, ear ache, nasal congestion, post nasal drip,   CV:  No chest pain,  Orthopnea, PND, swelling in lower extremities, anasarca, dizziness, palpitations, syncope.   GI  No heartburn, indigestion, abdominal pain, nausea, vomiting, diarrhea, change  in bowel habits, loss of appetite, bloody stools.   Resp: No coughing up of blood.  No change in color of mucus.  No wheezing.  No chest wall deformity  Skin: no rash or lesions.  GU: no dysuria, change in color of urine, no urgency or frequency.  No flank pain, no hematuria   MS:  No joint pain or swelling.  No decreased range of motion.  No back pain.  Psych:  No change in mood or affect. No depression or anxiety.  No memory loss.         Objective:   Physical Exam    HEENT: nl dentition, turbinates, and orophanx. Nl external ear canals without cough reflex   NECK :  without JVD/Nodes/TM/ nl carotid upstrokes bilaterally   LUNGS: no acc muscle use, clear to A and P bilaterally without cough on insp or exp maneuvers   CV:  RRR  no s3 or murmur or increase in P2, no edema   ABD:  soft and nontender with nl excursion in the supine position. No bruits or organomegaly, bowel sounds nl  MS:  warm without deformities, calf tenderness, cyanosis or clubbing  SKIN: warm and dry without lesions    NEURO:  alert, approp, no deficits     cxr 03/25/13 No acute cardiopulmonary abnormality  seen.  Assessment:           Plan:

## 2013-04-24 ENCOUNTER — Encounter: Payer: Self-pay | Admitting: Adult Health

## 2013-04-24 ENCOUNTER — Ambulatory Visit (INDEPENDENT_AMBULATORY_CARE_PROVIDER_SITE_OTHER): Payer: Medicare HMO | Admitting: Adult Health

## 2013-04-24 VITALS — BP 110/70 | HR 89 | Temp 97.6°F | Ht 62.75 in | Wt 137.0 lb

## 2013-04-24 DIAGNOSIS — J45909 Unspecified asthma, uncomplicated: Secondary | ICD-10-CM

## 2013-04-24 NOTE — Patient Instructions (Addendum)
Continue on  Symbicort 160/4.30mcg 2 puffs Twice daily  , brush [name] [name] after use. Use spacer  Continue on  Pantoprazole 40mg  daily before meal .  Follow med calendar closely and bring to each visit.  follow up Dr. Sherene Sires  In 6 weeks as planned and As needed

## 2013-04-29 NOTE — Assessment & Plan Note (Signed)
Improved compensation  Patient's medications were reviewed today and patient education was given. Computerized medication calendar was adjusted/completed   Plan  ontinue on  Symbicort 160/4.18mcg 2 puffs Twice daily  , brush [name] [name] after use. Use spacer  Continue on  Pantoprazole 40mg  daily before meal .  Follow med calendar closely and bring to each visit.  follow up Dr. Sherene Sires  In 6 weeks as planned and As needed

## 2013-04-29 NOTE — Progress Notes (Signed)
Subjective:     Patient ID: [name] [name], female   DOB: 04/02/1956, MRN: [id number]  HPI  Brief patient profile:  40 yowf with MR and  longstanding asthma and rhinitis/ GERD followed by Dr Maple Hudson who referred her for pulmonary consultation.  04/09/2013 f/u ov/Wert 2nd opinion re asthma rx - came with friend who has husband with asthma "you helped him a lot and I want to see what you can do for her" but friend does not really look after pt daily or know her meds Chief Complaint  Patient presents with  . pulmonary consult   still on prednisone ? Dose with very poor hfa technique, very confused with meds and extremely poor hfa technique Sob with more than adls, min mucoid sputum, some better on pred and after saba, not clear how much she uses. Has neb solution but no neb????  No obvious daytime variabilty or assoc chronic cough or cp or chest tightness, subjective wheeze overt sinus   symptoms. No unusual exp hx or h/o childhood pna/ asthma or premature birth to he rknowledge.   Does have h/o gerd ? On ppi daily - not sure.   04/19/2013 Follow up and med review   Returns for follow up and med review  Did not bring her meds to office visit. Unclear what she is taking.  Has run out of prednisone .  Feels breathing is about the same . With rare cough . Has wheezing on /off.  Not taking symbicort on regular basis. Inhaler teaching in office with emphasis on maintenance vs As needed  meds .  Needs refill of Protoinix .  >restarted on protonix and symbicort  04/24/13  Follow up and med calendar  Pt returns for follow up and med review  Feels breathing is better , no wheezing or cough .  We reviewed all her meds and organized them into a med calendar.  No fever , chest pain, edema or n/v.    Review of Systems Constitutional:   No  weight loss, night sweats,  Fevers, chills, fatigue, or  lassitude.  HEENT:   No headaches,  Difficulty swallowing,  Tooth/dental problems, or  Sore throat,             No sneezing, itching, ear ache, nasal congestion, post nasal drip,   CV:  No chest pain,  Orthopnea, PND, swelling in lower extremities, anasarca, dizziness, palpitations, syncope.   GI  No heartburn, indigestion, abdominal pain, nausea, vomiting, diarrhea, change in bowel habits, loss of appetite, bloody stools.   Resp: No coughing up of blood.  No change in color of mucus.  No wheezing.  No chest wall deformity  Skin: no rash or lesions.  GU: no dysuria, change in color of urine, no urgency or frequency.  No flank pain, no hematuria   MS:  No joint pain or swelling.  No decreased range of motion.  No back pain.  Psych:  No change in mood or affect. No depression or anxiety.  No memory loss.         Objective:   Physical Exam    HEENT: nl dentition, turbinates, and orophanx. Nl external ear canals without cough reflex   NECK :  without JVD/Nodes/TM/ nl carotid upstrokes bilaterally   LUNGS: no acc muscle use, clear to A and P bilaterally without cough on insp or exp maneuvers   CV:  RRR  no s3 or murmur or increase in P2, no edema   ABD:  soft  and nontender with nl excursion in the supine position. No bruits or organomegaly, bowel sounds nl  MS:  warm without deformities, calf tenderness, cyanosis or clubbing  SKIN: warm and dry without lesions    NEURO:  alert, approp, no deficits     cxr 03/25/13 No acute cardiopulmonary abnormality seen.  Assessment:           Plan:

## 2013-05-31 ENCOUNTER — Encounter: Payer: Self-pay | Admitting: Internal Medicine

## 2013-05-31 ENCOUNTER — Ambulatory Visit (INDEPENDENT_AMBULATORY_CARE_PROVIDER_SITE_OTHER): Payer: Medicare HMO | Admitting: Internal Medicine

## 2013-05-31 VITALS — BP 110/70 | HR 76 | Temp 97.3°F | Ht 62.75 in | Wt 132.0 lb

## 2013-05-31 DIAGNOSIS — J45909 Unspecified asthma, uncomplicated: Secondary | ICD-10-CM

## 2013-05-31 NOTE — Patient Instructions (Addendum)
No changes in medications  Please schedule a follow up visit in 3 months but call sooner if needed to see [name] NP with all medications in hand and your spacer

## 2013-05-31 NOTE — Progress Notes (Signed)
Subjective:     Patient ID: [name] [name], female   DOB: Apr 07, 1956, MRN: [id number]  HPI  Brief patient profile:  4 yowf never smoker with MR and  longstanding asthma and rhinitis/ GERD followed by Dr Maple Hudson who referred her for pulmonary consultation.  04/09/2013 f/u ov/[name] [name] 2nd opinion re asthma rx - came with friend who has husband with asthma "you helped him a lot and I want to see what you can do for her" but friend does not really look after pt daily or know her meds Chief Complaint  Patient presents with  . pulmonary consult   still on prednisone ? Dose with very poor hfa technique, very confused with meds and extremely poor hfa technique Sob with more than adls, min mucoid sputum, some better on pred and after saba, not clear how much she uses. Has neb solution but no neb???? rec Prednisone 1 daily x 5 days,  then 1 every other day until seen   Work on inhaler technique:  relax and gently blow all the way out then take a nice smooth deep breath back in, triggering the inhaler at same time you start breathing in.  Hold for up to 5 seconds if you can.  Rinse and gargle with water when done See [name] [name]  Does have h/o gerd ? On ppi daily - not sure.   04/19/2013 Follow up and med review   Returns for follow up and med review  Did not bring her meds to office visit. Unclear what she is taking.  Has run out of prednisone .  Feels breathing is about the same . With rare cough . Has wheezing on /off.  Not taking symbicort on regular basis. Inhaler teaching in office with emphasis on maintenance vs As needed  meds .  Needs refill of Protoinix .  >restarted on protonix and symbicort  04/24/13  Follow up and med calendar  Pt returns for follow up and med review  Feels breathing is better , no wheezing or cough .  We reviewed all her meds and organized them into a med calendar.  No fever , chest pain, edema or n/v.  rec Continue on  Symbicort 160/4.82mcg 2 puffs Twice daily  , brush  [name] [name] after use. Use spacer  Continue on  Pantoprazole 40mg  daily before meal    05/31/2013 f/u ov/[name] [name] did not bring med calendar Chief Complaint  Patient presents with  . Asthma    Breathing been doing well. Reports wheezing with activity. Denies SOB, chest tightness or coughing.  No obvious daytime variabilty or assoc chronic cough or cp or chest tightness, subjective wheeze overt sinus or hb symptoms. No unusual exp hx or h/o childhood pna/ asthma or premature birth to her knowledge.    Sleeping ok without nocturnal  or early am exacerbation  of respiratory  c/o's or need for noct saba. Also denies any obvious fluctuation of symptoms with weather or environmental changes or other aggravating or alleviating factors except as outlined above   Current Medications, Allergies, Past Medical History, Past Surgical History, Family History, and Social History were reviewed in [name] record.  ROS  The following are not active complaints unless bolded sore throat, dysphagia, dental problems, itching, sneezing,  nasal congestion or excess/ purulent secretions, ear ache,   fever, chills, sweats, unintended wt loss, pleuritic or exertional cp, hemoptysis,  orthopnea pnd or leg swelling, presyncope, palpitations, heartburn, abdominal pain, anorexia, nausea, vomiting, diarrhea  or change  in bowel or urinary habits, change in stools or urine, dysuria,hematuria,  rash, arthralgias, visual complaints, headache, numbness weakness or ataxia or problems with walking or coordination,  change in mood/affect or memory.                Objective:   Physical Exam    HEENT: nl dentition, turbinates, and orophanx. Nl external ear canals without cough reflex   NECK :  without JVD/Nodes/TM/ nl carotid upstrokes bilaterally   LUNGS: no acc muscle use, clear to A and P bilaterally without cough on insp or exp maneuvers   CV:  RRR  no s3 or murmur or increase in P2, no edema    ABD:  soft and nontender with nl excursion in the supine position. No bruits or organomegaly, bowel sounds nl  MS:  warm without deformities, calf tenderness, cyanosis or clubbing        cxr 03/25/13 No acute cardiopulmonary abnormality seen.  Assessment:           Plan:

## 2013-06-01 NOTE — Assessment & Plan Note (Signed)
-   hfa < 25% 04/09/13  - spacer added 04/19/2013  -04/24/13 med calendar  > not keeping up with this  Despite inability to do accurate med rec here All goals of chronic asthma control met including optimal function and elimination of symptoms with minimal need for rescue therapy.  Contingencies discussed in full including contacting this office immediately if not controlling the symptoms using the rule of two's.       Each maintenance medication was reviewed in detail including most importantly the difference between maintenance and as needed and under what circumstances the prns are to be used.  Please see instructions for details which were reviewed in writing and the patient given a copy.

## 2013-08-19 ENCOUNTER — Ambulatory Visit: Payer: Medicare PPO | Admitting: Internal Medicine

## 2013-08-30 ENCOUNTER — Ambulatory Visit (INDEPENDENT_AMBULATORY_CARE_PROVIDER_SITE_OTHER): Payer: Medicare HMO | Admitting: Internal Medicine

## 2013-08-30 ENCOUNTER — Telehealth: Payer: Self-pay | Admitting: Adult Health

## 2013-08-30 ENCOUNTER — Encounter: Payer: Self-pay | Admitting: Internal Medicine

## 2013-08-30 VITALS — BP 122/86 | HR 84 | Temp 98.4°F | Ht 62.0 in | Wt 133.4 lb

## 2013-08-30 DIAGNOSIS — J45909 Unspecified asthma, uncomplicated: Secondary | ICD-10-CM

## 2013-08-30 NOTE — Telephone Encounter (Signed)
Error.[name] [name] ° °

## 2013-08-30 NOTE — Patient Instructions (Addendum)
No change in your medications  See if you and your sister can both come in at the same time to see [name] [name] in the next 3 months with all your medications and inhalers and spacers

## 2013-08-30 NOTE — Progress Notes (Signed)
Subjective:     Patient ID: [name] [name], female   DOB: 25-Jun-1956, MRN: [id number]  HPI  Brief patient profile:  42 yowf never smoker with MR and  longstanding asthma and rhinitis/ GERD followed by Dr Maple Hudson who referred her for pulmonary consultation.  04/09/2013 f/u ov/[facility] 2nd opinion re asthma rx - came with friend who has husband with asthma "you helped him a lot and I want to see what you can do for her" but friend does not really look after pt daily or know her meds Chief Complaint  Patient presents with  . pulmonary consult   still on prednisone ? Dose with very poor hfa technique, very confused with meds and extremely poor hfa technique Sob with more than adls, min mucoid sputum, some better on pred and after saba, not clear how much she uses. Has neb solution but no neb???? rec Prednisone 1 daily x 5 days,  then 1 every other day until seen   Work on inhaler technique:  relax and gently blow all the way out then take a nice smooth deep breath back in, triggering the inhaler at same time you start breathing in.  Hold for up to 5 seconds if you can.  Rinse and gargle with water when done See [name] NP  Does have h/o gerd ? On ppi daily - not sure.   04/19/2013 Follow up and med review   Returns for follow up and med review  Did not bring her meds to office visit. Unclear what she is taking.  Has run out of prednisone .  Feels breathing is about the same . With rare cough . Has wheezing on /off.  Not taking symbicort on regular basis. Inhaler teaching in office with emphasis on maintenance vs As needed  meds .  Needs refill of Protoinix .  >restarted on protonix and symbicort  04/24/13  Follow up and med calendar  Pt returns for follow up and med review  Feels breathing is better , no wheezing or cough .  We reviewed all her meds and organized them into a med calendar.  No fever , chest pain, edema or n/v.  rec Continue on  Symbicort 160/4.66mcg 2 puffs Twice daily  , brush  [name] [name] after use. Use spacer  Continue on  Pantoprazole 40mg  daily before meal    05/31/2013 f/u ov/[name] did not bring med calendar Chief Complaint  Patient presents with  . Asthma    Breathing been doing well. Reports wheezing with activity. Denies SOB, chest tightness or coughing.  rec No change rx, bring medication and spacer with you   08/30/2013 f/u ov/[name] did not bring meds or spacer or calendar Chief Complaint  Patient presents with  . Follow-up    Pt states breathing is doing well, still has occ wheezing. No new co's today.,      No obvious daytime variabilty or assoc chronic cough or cp or chest tightness, subjective wheeze overt sinus or hb symptoms. No unusual exp hx or h/o childhood pna/ asthma or premature birth to her knowledge.    Sleeping ok without nocturnal  or early am exacerbation  of respiratory  c/o's or need for noct saba. Also denies any obvious fluctuation of symptoms with weather or environmental changes or other aggravating or alleviating factors except as outlined above   Current Medications, Allergies, Past Medical History, Past Surgical History, Family History, and Social History were reviewed in [name] record.  ROS  The  following are not active complaints unless bolded sore throat, dysphagia, dental problems, itching, sneezing,  nasal congestion or excess/ purulent secretions, ear ache,   fever, chills, sweats, unintended wt loss, pleuritic or exertional cp, hemoptysis,  orthopnea pnd or leg swelling, presyncope, palpitations, heartburn, abdominal pain, anorexia, nausea, vomiting, diarrhea  or change in bowel or urinary habits, change in stools or urine, dysuria,hematuria,  rash, arthralgias, visual complaints, headache, numbness weakness or ataxia or problems with walking or coordination,  change in mood/affect or memory.                Objective:   Physical Exam   amb wf nad x ? Slowed mentation  Wt Readings  from Last 3 Encounters:  08/30/13 133 lb 6.4 oz (60.51 kg)  05/31/13 132 lb (59.875 kg)  04/24/13 137 lb (62.143 kg)      HEENT: nl dentition, turbinates, and orophanx. Nl external ear canals without cough reflex   NECK :  without JVD/Nodes/TM/ nl carotid upstrokes bilaterally   LUNGS: no acc muscle use, clear to A and P bilaterally without cough on insp or exp maneuvers   CV:  RRR  no s3 or murmur or increase in P2, no edema   ABD:  soft and nontender with nl excursion in the supine position. No bruits or organomegaly, bowel sounds nl  MS:  warm without deformities, calf tenderness, cyanosis or clubbing        cxr 03/25/13 No acute cardiopulmonary abnormality seen.  Assessment:

## 2013-09-01 NOTE — Assessment & Plan Note (Signed)
All goals of chronic asthma control met including optimal function and elimination of symptoms with minimal need for rescue therapy.  Contingencies discussed in full including contacting this office immediately if not controlling the symptoms using the rule of two's.     Continue to be concerned re her understanding of the simplest instructions and ability to handle any kind of action plan.    Each maintenance medication was reviewed in detail including most importantly the difference between maintenance and as needed and under what circumstances the prns are to be used.  Please see instructions for details which were reviewed in writing and the patient given a copy.

## 2013-10-04 ENCOUNTER — Emergency Department (HOSPITAL_COMMUNITY)
Admission: EM | Admit: 2013-10-04 | Discharge: 2013-10-05 | Disposition: A | Discharge status: Home / Self Care | Payer: Medicare HMO | Attending: Emergency Medicine | Admitting: Emergency Medicine

## 2013-10-04 ENCOUNTER — Emergency Department (HOSPITAL_COMMUNITY): Payer: Medicare HMO

## 2013-10-04 ENCOUNTER — Encounter (HOSPITAL_COMMUNITY): Payer: Self-pay | Admitting: Emergency Medicine

## 2013-10-04 DIAGNOSIS — Z79899 Other long term (current) drug therapy: Secondary | ICD-10-CM | POA: Insufficient documentation

## 2013-10-04 DIAGNOSIS — Z8701 Personal history of pneumonia (recurrent): Secondary | ICD-10-CM | POA: Insufficient documentation

## 2013-10-04 DIAGNOSIS — M25561 Pain in right knee: Secondary | ICD-10-CM

## 2013-10-04 DIAGNOSIS — W010XXA Fall on same level from slipping, tripping and stumbling without subsequent striking against object, initial encounter: Secondary | ICD-10-CM | POA: Insufficient documentation

## 2013-10-04 DIAGNOSIS — K219 Gastro-esophageal reflux disease without esophagitis: Secondary | ICD-10-CM | POA: Insufficient documentation

## 2013-10-04 DIAGNOSIS — Z8659 Personal history of other mental and behavioral disorders: Secondary | ICD-10-CM | POA: Insufficient documentation

## 2013-10-04 DIAGNOSIS — J45909 Unspecified asthma, uncomplicated: Secondary | ICD-10-CM | POA: Insufficient documentation

## 2013-10-04 DIAGNOSIS — Y9301 Activity, walking, marching and hiking: Secondary | ICD-10-CM | POA: Insufficient documentation

## 2013-10-04 DIAGNOSIS — Y929 Unspecified place or not applicable: Secondary | ICD-10-CM | POA: Insufficient documentation

## 2013-10-04 DIAGNOSIS — S8990XA Unspecified injury of unspecified lower leg, initial encounter: Secondary | ICD-10-CM | POA: Insufficient documentation

## 2013-10-04 DIAGNOSIS — Z791 Long term (current) use of non-steroidal anti-inflammatories (NSAID): Secondary | ICD-10-CM | POA: Insufficient documentation

## 2013-10-04 DIAGNOSIS — W19XXXA Unspecified fall, initial encounter: Secondary | ICD-10-CM

## 2013-10-04 DIAGNOSIS — Z862 Personal history of diseases of the blood and blood-forming organs and certain disorders involving the immune mechanism: Secondary | ICD-10-CM | POA: Insufficient documentation

## 2013-10-04 MED ORDER — KETOROLAC TROMETHAMINE 60 MG/2ML IM SOLN
60.0000 mg | Freq: Once | INTRAMUSCULAR | Status: AC
  Administered 2013-10-04: 60 mg via INTRAMUSCULAR
  Filled 2013-10-04: qty 2

## 2013-10-04 MED ORDER — OXYCODONE-ACETAMINOPHEN 5-325 MG PO TABS
1.0000 | ORAL_TABLET | Freq: Once | ORAL | Status: AC
  Administered 2013-10-04: 1 via ORAL
  Filled 2013-10-04: qty 1

## 2013-10-04 NOTE — ED Provider Notes (Signed)
CSN: [id number]     Arrival date & time 10/04/13  2211 History   First MD Initiated Contact with Patient 10/04/13 2224     Chief Complaint  Patient presents with  . Knee Pain   (Consider location/radiation/quality/duration/timing/severity/associated sxs/prior Treatment) HPI Comments: Patient presents with complaint of bilateral knee pain which began acutely after she slipped on bricks approximately 6 hours ago. Patient's friend who is at bedside states that she was walking but with pain. Pain became worse and patient states that she cannot move either knee. She denies numbness, tingling, or weakness in her lower extremities. Pain is exacerbated with movement and palpation. No treatments prior to arrival. The onset of this condition was acute. The course is constant. Alleviating factors: none.    Patient is a 57 y.o. female presenting with knee pain. The history is provided by the patient.  Knee Pain Associated symptoms: no back pain and no neck pain     Past Medical History  Diagnosis Date  . Acute bronchitis   . Esophageal reflux   . Anemia, unspecified   . Mild intellectual disabilities   . Pneumonia, organism unspecified   . Other acute reactions to stress   . Unspecified asthma(493.90)    Past Surgical History  Procedure Laterality Date  . Tonsillectomy     Family History  Problem Relation Age of Onset  . Colon cancer Father   . Asthma Sister    History  Substance Use Topics  . Smoking status: Never Smoker   . Smokeless tobacco: Never Used  . Alcohol Use: No   OB History   Grav Para Term Preterm Abortions TAB SAB Ect Mult Living                 Review of Systems  Constitutional: Positive for activity change.  Musculoskeletal: Positive for arthralgias and gait problem. Negative for back pain, joint swelling and neck pain.  Skin: Negative for wound.  Neurological: Negative for weakness and numbness.    Allergies  Review of patient's allergies indicates no  known allergies.  Home Medications   Current Outpatient Rx  Name  Route  Sig  Dispense  Refill  . budesonide-formoterol (SYMBICORT) 160-4.5 MCG/ACT inhaler      Take 2 puffs first thing in am and then another 2 puffs about 12 hours later.         . calcium carbonate (TUMS - DOSED IN MG ELEMENTAL CALCIUM) 500 MG chewable tablet   Oral   Chew 1 tablet by mouth daily.         . naproxen sodium (ANAPROX) 220 MG tablet   Oral   Take 220 mg by mouth 2 (two) times daily with a meal.         . pantoprazole (PROTONIX) 40 MG tablet   Oral   Take 1 tablet (40 mg total) by mouth daily at 12 noon.   30 tablet   0   . zolpidem (AMBIEN) 5 MG tablet   Oral   Take 1 tablet (5 mg total) by mouth at bedtime as needed for sleep (insomnia).   30 tablet   5    BP 121/71  Pulse 78  Temp(Src) 98.5 F (36.9 C) (Oral)  Resp 18  SpO2 100% Physical Exam  Nursing note and vitals reviewed. Constitutional: She appears well-developed and well-nourished.  HENT:  Head: Normocephalic and atraumatic.  Eyes: Pupils are equal, round, and reactive to light.  Neck: Normal range of motion. Neck supple.  Cardiovascular:  Exam reveals no decreased pulses.   Pulses:      Dorsalis pedis pulses are 2+ on the right side, and 2+ on the left side.       Posterior tibial pulses are 2+ on the right side, and 2+ on the left side.  Musculoskeletal: She exhibits tenderness. She exhibits no edema.       Right hip: Normal.       Left hip: Normal.       Right knee: She exhibits decreased range of motion (pt refuses to move knee). She exhibits no ecchymosis, no deformity and normal patellar mobility. Tenderness (tender with any palpation in vacinity of knee) found.       Left knee: She exhibits decreased range of motion (pt refuses to move knee). She exhibits no ecchymosis and no deformity. Tenderness (tenderness with palpation anywhere in vacinity of knee) found.       Right ankle: Normal.       Left ankle:  Normal.       Right upper leg: She exhibits no tenderness.       Left upper leg: She exhibits no tenderness.       Right lower leg: She exhibits no tenderness and no bony tenderness.       Left lower leg: She exhibits no tenderness and no bony tenderness.       Legs: Neurological: She is alert. No sensory deficit.  Motor, sensation, and vascular distal to the injury is fully intact.   Skin: Skin is warm and dry.  Psychiatric: She has a normal mood and affect.    ED Course  Procedures (including critical care time) Labs Review Labs Reviewed - No data to display Imaging Review Dg Knee Complete 4 Views Left  10/04/2013   *RADIOLOGY REPORT*  Clinical Data: Left knee pain, status post fall.  LEFT KNEE - COMPLETE 4+ VIEW  Comparison: None.  Findings: There is no evidence of fracture or dislocation.  The joint spaces are preserved.  No significant degenerative change is seen; the patellofemoral joint is grossly unremarkable in appearance.  A fabella is noted.  A small knee joint effusion is seen.  The visualized soft tissues are otherwise unremarkable in appearance.  IMPRESSION:  1.  No evidence of fracture or dislocation. 2.  Small knee joint effusion noted.   Original Report Authenticated By: Tonia Ghent, M.D.   Dg Knee Complete 4 Views Right  10/05/2013   *RADIOLOGY REPORT*  Clinical Data: Bilateral knee pain, status post fall.  RIGHT KNEE - COMPLETE 4+ VIEW  Comparison: None.  Findings: There is no evidence of fracture or dislocation.  The joint spaces are preserved.  No significant degenerative change is seen; the patellofemoral joint is grossly unremarkable in appearance.  A moderate knee joint effusion is seen.  The visualized soft tissues are otherwise unremarkable in appearance.  IMPRESSION:  1.  No evidence of fracture or dislocation. 2.  Moderate knee joint effusion noted.   Original Report Authenticated By: Tonia Ghent, M.D.    EKG Interpretation   None      10:49 PM Patient  seen and examined. Work-up initiated. Medications ordered.   Vital signs reviewed and are as follows: Filed Vitals:   10/04/13 2217  BP: 121/71  Pulse: 78  Temp: 98.5 F (36.9 C)  Resp: 18   12:39 AM Patient informed of results. She has ambulated. Ortho f/u given.   Patient counseled on use of narcotic pain medications. Counseled not to combine these medications  with others containing tylenol. Urged not to drink alcohol, drive, or perform any other activities that requires focus while taking these medications. The patient verbalizes understanding and agrees with the plan.  MDM   1. Knee pain, bilateral   2. Fall, initial encounter    Patient with knee pain after fall. X-rays show mild effusions. LE's are neurovascularly intact. No signs of compartment syndrome. Pedal pulses present. Patient ambulatory with some pain.     Renne Crigler, PA-C 10/05/13 [phone]

## 2013-10-04 NOTE — ED Notes (Signed)
Patient states that she fell while going down some steps. She states she was able to get up and ambulate after the fall, but states now the pain is unbearable and that she twisted her left ankle and now the pain is unbearable in both knees.

## 2013-10-04 NOTE — ED Notes (Signed)
Patient transported to X-ray 

## 2013-10-04 NOTE — ED Notes (Signed)
Bed: ZO10 Expected date:  Expected time:  Means of arrival:  Comments: EMS 57yo F left knee pain s/p fall

## 2013-10-05 MED ORDER — OXYCODONE-ACETAMINOPHEN 5-325 MG PO TABS: 1.0000 | ORAL_TABLET | Freq: Four times a day (QID) | ORAL | Status: DC | PRN

## 2013-10-05 MED ORDER — NAPROXEN 500 MG PO TABS: 500.0000 mg | ORAL_TABLET | Freq: Two times a day (BID) | ORAL | Status: DC

## 2013-10-06 NOTE — ED Provider Notes (Signed)
Medical screening examination/treatment/procedure(s) were performed by non-physician practitioner and as supervising physician I was immediately available for consultation/collaboration.  Delories Mauri R. Alejandra Hunt, MD 10/06/13 2335 

## 2013-11-29 ENCOUNTER — Encounter: Payer: Medicare HMO | Admitting: Adult Health

## 2013-12-03 ENCOUNTER — Encounter: Payer: Self-pay | Admitting: Adult Health

## 2013-12-12 ENCOUNTER — Telehealth: Payer: Self-pay | Admitting: Internal Medicine

## 2013-12-12 NOTE — Telephone Encounter (Signed)
Called and spoke with pt  And she stated that it was not her that needed the refill it was her sister tammy Froberg.  Disregard this message.

## 2014-12-04 ENCOUNTER — Telehealth: Payer: Self-pay | Admitting: Internal Medicine

## 2014-12-04 NOTE — Telephone Encounter (Signed)
ATC pt. Line rang several times with no VM. WCB.  

## 2014-12-04 NOTE — Telephone Encounter (Signed)
Pt calling stating that she needs some symbicort.[name] [name]

## 2014-12-05 NOTE — Telephone Encounter (Signed)
Pt has not been seen since 08-2013, appt set for Monday. [name], CMA

## 2014-12-06 ENCOUNTER — Encounter (HOSPITAL_COMMUNITY): Payer: Self-pay | Admitting: Emergency Medicine

## 2014-12-06 ENCOUNTER — Emergency Department (HOSPITAL_COMMUNITY): Payer: Medicare HMO

## 2014-12-06 ENCOUNTER — Inpatient Hospital Stay (HOSPITAL_COMMUNITY)
Admission: EM | Admit: 2014-12-06 | Discharge: 2014-12-09 | DRG: 871 | Disposition: A | Discharge status: Home Health | Payer: Medicare HMO | Attending: Internal Medicine | Admitting: Internal Medicine

## 2014-12-06 DIAGNOSIS — J45909 Unspecified asthma, uncomplicated: Secondary | ICD-10-CM | POA: Diagnosis present

## 2014-12-06 DIAGNOSIS — Z23 Encounter for immunization: Secondary | ICD-10-CM

## 2014-12-06 DIAGNOSIS — E876 Hypokalemia: Secondary | ICD-10-CM | POA: Diagnosis present

## 2014-12-06 DIAGNOSIS — R51 Headache: Secondary | ICD-10-CM | POA: Diagnosis not present

## 2014-12-06 DIAGNOSIS — E872 Acidosis: Secondary | ICD-10-CM | POA: Diagnosis present

## 2014-12-06 DIAGNOSIS — M25531 Pain in right wrist: Secondary | ICD-10-CM | POA: Diagnosis present

## 2014-12-06 DIAGNOSIS — F70 Mild intellectual disabilities: Secondary | ICD-10-CM | POA: Diagnosis present

## 2014-12-06 DIAGNOSIS — D509 Iron deficiency anemia, unspecified: Secondary | ICD-10-CM | POA: Diagnosis present

## 2014-12-06 DIAGNOSIS — W19XXXA Unspecified fall, initial encounter: Secondary | ICD-10-CM | POA: Diagnosis present

## 2014-12-06 DIAGNOSIS — K219 Gastro-esophageal reflux disease without esophagitis: Secondary | ICD-10-CM | POA: Diagnosis present

## 2014-12-06 DIAGNOSIS — J9601 Acute respiratory failure with hypoxia: Secondary | ICD-10-CM | POA: Diagnosis present

## 2014-12-06 DIAGNOSIS — Z79899 Other long term (current) drug therapy: Secondary | ICD-10-CM | POA: Diagnosis not present

## 2014-12-06 DIAGNOSIS — R52 Pain, unspecified: Secondary | ICD-10-CM

## 2014-12-06 DIAGNOSIS — A419 Sepsis, unspecified organism: Secondary | ICD-10-CM | POA: Diagnosis present

## 2014-12-06 DIAGNOSIS — J189 Pneumonia, unspecified organism: Secondary | ICD-10-CM | POA: Diagnosis not present

## 2014-12-06 DIAGNOSIS — S63501A Unspecified sprain of right wrist, initial encounter: Secondary | ICD-10-CM

## 2014-12-06 DIAGNOSIS — R05 Cough: Secondary | ICD-10-CM

## 2014-12-06 DIAGNOSIS — R059 Cough, unspecified: Secondary | ICD-10-CM

## 2014-12-06 DIAGNOSIS — I9589 Other hypotension: Secondary | ICD-10-CM

## 2014-12-06 LAB — COMPREHENSIVE METABOLIC PANEL
ALK PHOS: 83 U/L (ref 39–117)
ALT: 10 U/L (ref 0–35)
AST: 19 U/L (ref 0–37)
Albumin: 4 g/dL (ref 3.5–5.2)
Anion gap: 17 — ABNORMAL HIGH (ref 5–15)
BUN: 15 mg/dL (ref 6–23)
CALCIUM: 9.4 mg/dL (ref 8.4–10.5)
CO2: 22 mEq/L (ref 19–32)
Chloride: 96 mEq/L (ref 96–112)
Creatinine, Ser: 0.93 mg/dL (ref 0.50–1.10)
GFR calc Af Amer: 78 mL/min — ABNORMAL LOW (ref >90)
GFR calc non Af Amer: 67 mL/min — ABNORMAL LOW (ref >90)
GLUCOSE: 126 mg/dL — AB (ref 70–99)
POTASSIUM: 3.7 meq/L (ref 3.7–5.3)
Sodium: 135 mEq/L — ABNORMAL LOW (ref 137–147)
TOTAL PROTEIN: 7.6 g/dL (ref 6.0–8.3)
Total Bilirubin: 0.7 mg/dL (ref 0.3–1.2)

## 2014-12-06 LAB — URINALYSIS, ROUTINE W REFLEX MICROSCOPIC
Bilirubin Urine: NEGATIVE
GLUCOSE, UA: NEGATIVE mg/dL
Hgb urine dipstick: NEGATIVE
Ketones, ur: 40 mg/dL — AB
Leukocytes, UA: NEGATIVE
NITRITE: NEGATIVE
Protein, ur: NEGATIVE mg/dL
Specific Gravity, Urine: 1.023 (ref 1.005–1.030)
Urobilinogen, UA: 0.2 mg/dL (ref 0.0–1.0)
pH: 5 (ref 5.0–8.0)

## 2014-12-06 LAB — CBC WITH DIFFERENTIAL/PLATELET
BASOS ABS: 0 10*3/uL (ref 0.0–0.1)
Basophils Relative: 0 % (ref 0–1)
Eosinophils Absolute: 0 10*3/uL (ref 0.0–0.7)
Eosinophils Relative: 0 % (ref 0–5)
HCT: 35.5 % — ABNORMAL LOW (ref 36.0–46.0)
HEMOGLOBIN: 11.9 g/dL — AB (ref 12.0–15.0)
Lymphocytes Relative: 9 % — ABNORMAL LOW (ref 12–46)
Lymphs Abs: 0.9 10*3/uL (ref 0.7–4.0)
MCH: 29.4 pg (ref 26.0–34.0)
MCHC: 33.5 g/dL (ref 30.0–36.0)
MCV: 87.7 fL (ref 78.0–100.0)
MONOS PCT: 7 % (ref 3–12)
Monocytes Absolute: 0.6 10*3/uL (ref 0.1–1.0)
Neutro Abs: 7.9 10*3/uL — ABNORMAL HIGH (ref 1.7–7.7)
Neutrophils Relative %: 84 % — ABNORMAL HIGH (ref 43–77)
Platelets: 214 10*3/uL (ref 150–400)
RBC: 4.05 MIL/uL (ref 3.87–5.11)
RDW: 13.1 % (ref 11.5–15.5)
WBC: 9.4 10*3/uL (ref 4.0–10.5)

## 2014-12-06 LAB — LACTIC ACID, PLASMA: LACTIC ACID, VENOUS: 1.3 mmol/L (ref 0.5–2.2)

## 2014-12-06 LAB — STREP PNEUMONIAE URINARY ANTIGEN: Strep Pneumo Urinary Antigen: NEGATIVE

## 2014-12-06 LAB — I-STAT CG4 LACTIC ACID, ED: LACTIC ACID, VENOUS: 0.99 mmol/L (ref 0.5–2.2)

## 2014-12-06 MED ORDER — HYDROCODONE-ACETAMINOPHEN 5-325 MG PO TABS
2.0000 | ORAL_TABLET | Freq: Once | ORAL | Status: AC
  Administered 2014-12-06: 2 via ORAL
  Filled 2014-12-06: qty 2

## 2014-12-06 MED ORDER — PANTOPRAZOLE SODIUM 40 MG PO TBEC
40.0000 mg | DELAYED_RELEASE_TABLET | Freq: Every day | ORAL | Status: DC
  Administered 2014-12-07 – 2014-12-09 (×3): 40 mg via ORAL
  Filled 2014-12-06 (×3): qty 1

## 2014-12-06 MED ORDER — HEPARIN SODIUM (PORCINE) 5000 UNIT/ML IJ SOLN
5000.0000 [IU] | Freq: Three times a day (TID) | INTRAMUSCULAR | Status: DC
  Administered 2014-12-06 – 2014-12-09 (×8): 5000 [IU] via SUBCUTANEOUS
  Filled 2014-12-06 (×11): qty 1

## 2014-12-06 MED ORDER — SODIUM CHLORIDE 0.9 % IV BOLUS (SEPSIS)
1000.0000 mL | INTRAVENOUS | Status: AC
  Administered 2014-12-06 (×2): 1000 mL via INTRAVENOUS

## 2014-12-06 MED ORDER — LEVOFLOXACIN 750 MG PO TABS
750.0000 mg | ORAL_TABLET | Freq: Once | ORAL | Status: AC
  Administered 2014-12-06: 750 mg via ORAL
  Filled 2014-12-06: qty 1

## 2014-12-06 MED ORDER — DEXTROSE 5 % IV SOLN
500.0000 mg | INTRAVENOUS | Status: DC
  Administered 2014-12-07 – 2014-12-08 (×2): 500 mg via INTRAVENOUS
  Filled 2014-12-06 (×4): qty 500

## 2014-12-06 MED ORDER — SODIUM CHLORIDE 0.9 % IV BOLUS (SEPSIS)
1000.0000 mL | Freq: Once | INTRAVENOUS | Status: AC
  Administered 2014-12-06: 1000 mL via INTRAVENOUS

## 2014-12-06 MED ORDER — DEXTROSE 5 % IV SOLN
1.0000 g | INTRAVENOUS | Status: DC
  Administered 2014-12-07 – 2014-12-08 (×2): 1 g via INTRAVENOUS
  Filled 2014-12-06 (×3): qty 10

## 2014-12-06 MED ORDER — INFLUENZA VAC SPLIT QUAD 0.5 ML IM SUSY
0.5000 mL | PREFILLED_SYRINGE | INTRAMUSCULAR | Status: AC
  Administered 2014-12-08: 0.5 mL via INTRAMUSCULAR
  Filled 2014-12-06 (×2): qty 0.5

## 2014-12-06 MED ORDER — DEXTROSE 5 % IV SOLN
1.0000 g | Freq: Once | INTRAVENOUS | Status: AC
  Administered 2014-12-06: 1 g via INTRAVENOUS
  Filled 2014-12-06: qty 10

## 2014-12-06 MED ORDER — SODIUM CHLORIDE 0.9 % IV SOLN
INTRAVENOUS | Status: DC
  Administered 2014-12-06: 21:00:00 via INTRAVENOUS

## 2014-12-06 MED ORDER — ALBUTEROL SULFATE HFA 108 (90 BASE) MCG/ACT IN AERS: 2.0000 | INHALATION_SPRAY | RESPIRATORY_TRACT | Status: DC | PRN

## 2014-12-06 MED ORDER — ALBUTEROL SULFATE (2.5 MG/3ML) 0.083% IN NEBU
5.0000 mg | INHALATION_SOLUTION | Freq: Once | RESPIRATORY_TRACT | Status: AC
  Administered 2014-12-06: 5 mg via RESPIRATORY_TRACT
  Filled 2014-12-06: qty 6

## 2014-12-06 MED ORDER — PNEUMOCOCCAL VAC POLYVALENT 25 MCG/0.5ML IJ INJ
0.5000 mL | INJECTION | INTRAMUSCULAR | Status: AC
  Administered 2014-12-08: 0.5 mL via INTRAMUSCULAR
  Filled 2014-12-06 (×2): qty 0.5

## 2014-12-06 MED ORDER — SODIUM CHLORIDE 0.9 % IV SOLN
INTRAVENOUS | Status: DC
  Administered 2014-12-06 – 2014-12-07 (×2): via INTRAVENOUS
  Administered 2014-12-07: 1000 mL via INTRAVENOUS
  Administered 2014-12-08: 06:00:00 via INTRAVENOUS

## 2014-12-06 MED ORDER — LEVOFLOXACIN 750 MG PO TABS: 750.0000 mg | ORAL_TABLET | Freq: Every day | ORAL | Status: DC

## 2014-12-06 MED ORDER — DEXTROSE 5 % IV SOLN
500.0000 mg | Freq: Once | INTRAVENOUS | Status: AC
  Administered 2014-12-06: 500 mg via INTRAVENOUS
  Filled 2014-12-06: qty 500

## 2014-12-06 MED ORDER — OXYCODONE-ACETAMINOPHEN 5-325 MG PO TABS
1.0000 | ORAL_TABLET | Freq: Four times a day (QID) | ORAL | Status: DC | PRN
  Administered 2014-12-07 (×2): 1 via ORAL
  Filled 2014-12-06 (×2): qty 1

## 2014-12-06 MED ORDER — ALBUTEROL SULFATE (2.5 MG/3ML) 0.083% IN NEBU
2.5000 mg | INHALATION_SOLUTION | RESPIRATORY_TRACT | Status: DC | PRN
  Administered 2014-12-07: 2.5 mg via RESPIRATORY_TRACT
  Filled 2014-12-06: qty 3

## 2014-12-06 NOTE — ED Notes (Signed)
Spoke with ortho tech, no splints available on the unit to apply to patient's right upper extremity.  He acknowledges, and ortho tech will apply new splint on new unit 5W. Called and reported this to receiving unit.

## 2014-12-06 NOTE — Progress Notes (Signed)
Received pt report from Mica,RN-ED. 

## 2014-12-06 NOTE — ED Notes (Signed)
Dr. Denton LankSteinl at the bedside, discussed first bolus has finished, bp still in 3780's.  MD acknowledges, and gives verbal order for 2 more liter boluses.

## 2014-12-06 NOTE — Discharge Instructions (Signed)
It was our pleasure to provide your ER care today - we hope that you feel better.  Drink plenty of fluids.  Take levaquin (antibiotic) as prescribed.  Use albuterol inhaler as need.  Follow up with primary care doctor, or here if unable to see primary care doctor, Monday for recheck.  For wrist, wear splint, take tylenol/advil as need. Follow up with primary care doctor, or here, for recheck in 1 week if symptoms fail to improve/resolve (for possible repeat xray to check for occult fracture).  You were given pain medication in the ER  - no driving for the next 4 hours.  Return to ER right away if worse, trouble breathing, weak/faint, other concern.    Pneumonia Pneumonia is an infection of the lungs.  CAUSES Pneumonia may be caused by bacteria or a virus. Usually, these infections are caused by breathing infectious particles into the lungs (respiratory tract). SIGNS AND SYMPTOMS   Cough.  Fever.  Chest pain.  Increased rate of breathing.  Wheezing.  Mucus production. DIAGNOSIS  If you have the common symptoms of pneumonia, your health care provider will typically confirm the diagnosis with a chest X-ray. The X-ray will show an abnormality in the lung (pulmonary infiltrate) if you have pneumonia. Other tests of your blood, urine, or sputum may be done to find the specific cause of your pneumonia. Your health care provider may also do tests (blood gases or pulse oximetry) to see how well your lungs are working. TREATMENT  Some forms of pneumonia may be spread to other people when you cough or sneeze. You may be asked to wear a mask before and during your exam. Pneumonia that is caused by bacteria is treated with antibiotic medicine. Pneumonia that is caused by the influenza virus may be treated with an antiviral medicine. Most other viral infections must run their course. These infections will not respond to antibiotics.  HOME CARE INSTRUCTIONS   Cough suppressants may be used  if you are losing too much rest. However, coughing protects you by clearing your lungs. You should avoid using cough suppressants if you can.  Your health care provider may have prescribed medicine if he or she thinks your pneumonia is caused by bacteria or influenza. Finish your medicine even if you start to feel better.  Your health care provider may also prescribe an expectorant. This loosens the mucus to be coughed up.  Take medicines only as directed by your health care provider.  Do not smoke. Smoking is a common cause of bronchitis and can contribute to pneumonia. If you are a smoker and continue to smoke, your cough may last several weeks after your pneumonia has cleared.  A cold steam vaporizer or humidifier in your room or home may help loosen mucus.  Coughing is often worse at night. Sleeping in a semi-upright position in a recliner or using a couple pillows under your head will help with this.  Get rest as you feel it is needed. Your body will usually let you know when you need to rest. PREVENTION A pneumococcal shot (vaccine) is available to prevent a common bacterial cause of pneumonia. This is usually suggested for:  People over 56 years old.  Patients on chemotherapy.  People with chronic lung problems, such as bronchitis or emphysema.  People with immune system problems. If you are over 65 or have a high risk condition, you may receive the pneumococcal vaccine if you have not received it before. In some countries, a routine influenza  vaccine is also recommended. This vaccine can help prevent some cases of pneumonia.You may be offered the influenza vaccine as part of your care. If you smoke, it is time to quit. You may receive instructions on how to stop smoking. Your health care provider can provide medicines and counseling to help you quit. SEEK MEDICAL CARE IF: You have a fever. SEEK IMMEDIATE MEDICAL CARE IF:   Your illness becomes worse. This is especially true if  you are elderly or weakened from any other disease.  You cannot control your cough with suppressants and are losing sleep.  You begin coughing up blood.  You develop pain which is getting worse or is uncontrolled with medicines.  Any of the symptoms which initially brought you in for treatment are getting worse rather than better.  You develop shortness of breath or chest pain. MAKE SURE YOU:   Understand these instructions.  Will watch your condition.  Will get help right away if you are not doing well or get worse. Document Released: 12/12/2005 Document Revised: 04/28/2014 Document Reviewed: 03/03/2011 [facility] Patient Information 2015 [place], [place]. This information is not intended to replace advice given to you by your health care provider. Make sure you discuss any questions you have with your health care provider.   Wrist Pain Wrist injuries are frequent in adults and children. A sprain is an injury to the ligaments that hold your bones together. A strain is an injury to muscle or muscle cord-like structures (tendons) from stretching or pulling. Generally, when wrists are moderately tender to touch following a fall or injury, a break in the bone (fracture) may be present. Most wrist sprains or strains are better in 3 to 5 days, but complete healing may take several weeks. HOME CARE INSTRUCTIONS   Put ice on the injured area.  Put ice in a plastic bag.  Place a towel between your skin and the bag.  Leave the ice on for 15-20 minutes, 3-4 times a day, for the first 2 days, or as directed by your health care provider.  Keep your arm raised above the level of your heart whenever possible to reduce swelling and pain.  Rest the injured area for at least 48 hours or as directed by your health care provider.  If a splint or elastic bandage has been applied, use it for as long as directed by your health care provider or until seen by a health care provider for a follow-up  exam.  Only take over-the-counter or prescription medicines for pain, discomfort, or fever as directed by your health care provider.  Keep all follow-up appointments. You may need to follow up with a specialist or have follow-up X-rays. Improvement in pain level is not a guarantee that you did not fracture a bone in your wrist. The only way to determine whether or not you have a broken bone is by X-ray. SEEK IMMEDIATE MEDICAL CARE IF:   Your fingers are swollen, very red, white, or cold and blue.  Your fingers are numb or tingling.  You have increasing pain.  You have difficulty moving your fingers. MAKE SURE YOU:   Understand these instructions.  Will watch your condition.  Will get help right away if you are not doing well or get worse. Document Released: 09/21/2005 Document Revised: 12/17/2013 Document Reviewed: 02/02/2011 [facility] Patient Information 2015 [place], [place]. This information is not intended to replace advice given to you by your health care provider. Make sure you discuss any questions you have with  your health care provider.   Wrist Splint A wrist splint is a brace that holds your wrist in a fixed position. It can be used to stabilize your wrist so that broken bones and sprains can heal faster, with less pain. It can also help to relieve pressure on the nerve that runs down the middle of your arm (median nerve). Splints are available in drugstores without a prescription. They are also available by prescription from orthopedic and medical supply stores. Custom splints made from lightweight materials can be made by physical or occupational therapist. HOME CARE INSTRUCTIONS  Wear your splint as instructed by your caregiver. It may be worn while you sleep.  Your caregiver may instruct you how to perform certain exercises at home. These exercises help maintain muscle strength in your hand and wrist. They also help to maintain motion in your fingers. SEEK MEDICAL CARE  IF:  You start to lose feeling in your hand or fingers.  Your skin or fingernails turn blue or gray, or they feel cold. MAKE SURE YOU:   Understand these instructions.  Will watch your condition.  Will get help right away if you are not doing well or get worse. Document Released: 11/24/2006 Document Revised: 03/05/2012 Document Reviewed: 03/25/2014 [facility] Patient Information 2015 [place], [place]. This information is not intended to replace advice given to you by your health care provider. Make sure you discuss any questions you have with your health care provider.

## 2014-12-06 NOTE — ED Provider Notes (Signed)
CSN: [id number]     Arrival date & time 12/06/14  1450 History   First MD Initiated Contact with Patient 12/06/14 1514     Chief Complaint  Patient presents with  . Wrist Pain  . Cough     (Consider location/radiation/quality/duration/timing/severity/associated sxs/prior Treatment) Patient is a 58 y.o. female presenting with wrist pain and cough. The history is provided by the patient.  Wrist Pain Pertinent negatives include no chest pain, no abdominal pain and no headaches.  Cough Associated symptoms: wheezing   Associated symptoms: no chest pain, no chills, no diaphoresis, no headaches and no sore throat   pt states 2 days ago had a trip and fall, tried to brace/catch self on outstretched right hand.  C/o right wrist pain since. Pain constant. Dull. Mod-severe. Worse w palpation and movement. No hand numbness, weakness or loss of normal function. No elbow or shoulder pain. Denies other injury associated w fall. No faintness or dizziness prior to fall. No loc. No headache.   Pt also notes for past couple days non prod cough. Hx bronchitis and 'asthma'.  Denies prior admission related to asthma, no recent steroid use. States has been out of mdi for 'long time'.  No sore throat, runny nose, headache, body aches, or other uri c/o. No chest pain. Mild sob w coughing spells.      Past Medical History  Diagnosis Date  . Acute bronchitis   . Esophageal reflux   . Anemia, unspecified   . Mild intellectual disabilities   . Pneumonia, organism unspecified   . Other acute reactions to stress   . Unspecified asthma(493.90)    Past Surgical History  Procedure Laterality Date  . Tonsillectomy     Family History  Problem Relation Age of Onset  . Colon cancer Father   . Asthma Sister    History  Substance Use Topics  . Smoking status: Never Smoker   . Smokeless tobacco: Never Used  . Alcohol Use: No   OB History    No data available     Review of Systems  Constitutional:  Negative for chills and diaphoresis.  HENT: Negative for sore throat.   Eyes: Negative for pain and visual disturbance.  Respiratory: Positive for cough and wheezing.   Cardiovascular: Negative for chest pain, palpitations and leg swelling.  Gastrointestinal: Negative for vomiting, abdominal pain and diarrhea.  Endocrine: Negative for polyuria.  Genitourinary: Negative for dysuria and flank pain.  Musculoskeletal: Negative for back pain and neck pain.  Skin: Negative for wound.  Neurological: Negative for weakness, numbness and headaches.  Hematological: Does not bruise/bleed easily.  Psychiatric/Behavioral: Negative for confusion.      Allergies  Review of patient's allergies indicates no known allergies.  Home Medications   Prior to Admission medications   Medication Sig Start Date End Date Taking? Authorizing Provider  budesonide-formoterol (SYMBICORT) 160-4.5 MCG/ACT inhaler Take 2 puffs first thing in am and then another 2 puffs about 12 hours later. 04/09/13   Nyoka Cowden, MD  calcium carbonate (TUMS - DOSED IN MG ELEMENTAL CALCIUM) 500 MG chewable tablet Chew 1 tablet by mouth daily.    Historical Provider, MD  naproxen (NAPROSYN) 500 MG tablet Take 1 tablet (500 mg total) by mouth 2 (two) times daily. 10/05/13   Renne Crigler, PA-C  naproxen sodium (ANAPROX) 220 MG tablet Take 220 mg by mouth 2 (two) times daily with a meal.    Historical Provider, MD  oxyCODONE-acetaminophen (PERCOCET/ROXICET) 5-325 MG per tablet Take 1-2  tablets by mouth every 6 (six) hours as needed for pain. 10/05/13   Renne CriglerJoshua Geiple, PA-C  pantoprazole (PROTONIX) 40 MG tablet Take 1 tablet (40 mg total) by mouth daily at 12 noon. 09/29/12   Alison MurrayAlma M Devine, MD  zolpidem (AMBIEN) 5 MG tablet Take 1 tablet (5 mg total) by mouth at bedtime as needed for sleep (insomnia). 12/06/12 12/06/13  Waymon Budgelinton D Young, MD   BP 140/62 mmHg  Pulse 114  Temp(Src) 100.5 F (38.1 C) (Oral)  Resp 22  SpO2 95% Physical Exam   Constitutional: She is oriented to person, place, and time. She appears well-developed and well-nourished. No distress.  HENT:  Head: Atraumatic.  Mouth/Throat: Oropharynx is clear and moist.  Eyes: Conjunctivae are normal. Pupils are equal, round, and reactive to light. No scleral icterus.  Neck: Neck supple. No JVD present. No tracheal deviation present.  Cardiovascular: Normal rate, regular rhythm, normal heart sounds and intact distal pulses.  Exam reveals no gallop and no friction rub.   No murmur heard. Pulmonary/Chest: Effort normal. No respiratory distress. She has wheezes.  Upper resp congestion.   Abdominal: Soft. Normal appearance and bowel sounds are normal. She exhibits no distension and no mass. There is no tenderness. There is no rebound and no guarding.  Genitourinary:  No cva tenderness  Musculoskeletal: She exhibits tenderness. She exhibits no edema.  Mild sts and tenderness right wrist. No localized/focal scaphoid tenderness. Skin intact. Radial pulse 2+. Normal cap refill distally.  CTLS spine, non tender, aligned, no step off. Good rom bil ext, no other focal bony tenderness or pain noted.   Lymphadenopathy:    She has no cervical adenopathy.  Neurological: She is alert and oriented to person, place, and time.  Right r/m/u nerve fxn intact. Motor intact bil, stre 5/5. Steady gait.   Skin: Skin is warm and dry. No rash noted. She is not diaphoretic.  Psychiatric: She has a normal mood and affect.  Nursing note and vitals reviewed.   ED Course  Procedures (including critical care time) Labs Review Dg Chest 2 View  12/06/2014   CLINICAL DATA:  The patient had a fall 2 days ago. Pt slipped and landed on her right wrist. Pt is having pain in her wrist. Pt has also had a productive cough x 1-2 weeks that is worse when lying down. H/o asthma.  EXAM: CHEST  2 VIEW  COMPARISON:  03/25/2013  FINDINGS: The heart is normal in size. There is infiltrate in the right lower lobe  consistent with infectious process. Small right pleural effusion is present. The left lung is clear. No pulmonary edema.  IMPRESSION: Right lower lobe infiltrate.   Electronically Signed   By: Rosalie GumsBeth  Brown M.D.   On: 12/06/2014 17:10   Dg Wrist Complete Right  12/06/2014   CLINICAL DATA:  Cough.  Pain.  Slipped and fell.  EXAM: RIGHT WRIST - COMPLETE 3+ VIEW  COMPARISON:  None.  FINDINGS: There is mild radiocarpal narrowing without evidence for acute fracture or dislocation. Soft tissues have a normal appearance.  IMPRESSION: 1. Mild degenerative change. 2.  No evidence for acute  abnormality.   Electronically Signed   By: Rosalie GumsBeth  Brown M.D.   On: 12/06/2014 17:12       MDM  Reviewed nursing notes and prior charts for additional history.   Xrays.  Albuterol neb.  Pt states has ride, does not have to drive.  No meds pta. Confirmed nkda. vicodin po.  Recheck, w single neb.  No wheezing. Good air exchange. rr 14, pulse ox 99%. Hr 88.   Pt denies cp or sob.  Discussed xrays w pt, and possibility occult wrist fx - will splint.  Discussed cxr.  No increased wob or sob.  levaquin po.  Will give mdi, levaquin rx, and discussed closed follow up MOnday for recheck.  Pt currently appears stable for d/c, and pt indicates she feels ready for d/c.      Suzi RootsKevin E Mishael Krysiak, MD 12/06/14 [phone]

## 2014-12-06 NOTE — H&P (Signed)
[facility] History and Physical  [name] FootmanRobin L [name] ZOX:[id number] DOB: July 01, 1956 DOA: 12/06/2014  Referring physician: EDP PCP: No PCP Per Patient   Chief Complaint: Cough   HPI: [name] [name] is a 58 y.o. female who presents to the ED with cough, SOB.  Symptoms onset in mid Nov, seemed to get better but have gotten worse again in this past week.  Cough is non productive, has been out of MDI for asthma for a "long time", has history of bronchitis and asthma in the past.  Work up in ED demonstrates RLL CAP.  Review of Systems: Systems reviewed.  As above, otherwise negative  Past Medical History  Diagnosis Date  . Acute bronchitis   . Esophageal reflux   . Anemia, unspecified   . Mild intellectual disabilities   . Pneumonia, organism unspecified   . Other acute reactions to stress   . Unspecified asthma(493.90)    Past Surgical History  Procedure Laterality Date  . Tonsillectomy     Social History:  reports that she has never smoked. She has never used smokeless tobacco. She reports that she does not drink alcohol or use illicit drugs.  No Known Allergies  Family History  Problem Relation Age of Onset  . Colon cancer Father   . Asthma Sister      Prior to Admission medications   Medication Sig Start Date End Date Taking? Authorizing Provider  albuterol (PROVENTIL HFA;VENTOLIN HFA) 108 (90 BASE) MCG/ACT inhaler Inhale 2 puffs into the lungs every 4 (four) hours as needed for wheezing or shortness of breath. 12/06/14   Suzi RootsKevin E Steinl, MD  levofloxacin (LEVAQUIN) 750 MG tablet Take 1 tablet (750 mg total) by mouth daily. Start Sunday 12/13 12/06/14   Suzi RootsKevin E Steinl, MD  oxyCODONE-acetaminophen (PERCOCET/ROXICET) 5-325 MG per tablet Take 1-2 tablets by mouth every 6 (six) hours as needed for pain. 10/05/13   Renne CriglerJoshua Geiple, PA-C  pantoprazole (PROTONIX) 40 MG tablet Take 1 tablet (40 mg total) by mouth daily at 12 noon. 09/29/12   Alison MurrayAlma M Devine, MD   Physical  Exam: Filed Vitals:   12/06/14 2015  BP: 93/60  Pulse: 82  Temp:   Resp:     BP 93/60 mmHg  Pulse 82  Temp(Src) 99.1 F (37.3 C) (Oral)  Resp 22  SpO2 99%  General Appearance:    Alert, oriented, no distress, appears stated age  Head:    Normocephalic, atraumatic  Eyes:    PERRL, EOMI, sclera non-icteric        Nose:   Nares without drainage or epistaxis. Mucosa, turbinates normal  Throat:   Moist mucous membranes. Oropharynx without erythema or exudate.  Neck:   Supple. No carotid bruits.  No thyromegaly.  No lymphadenopathy.   Back:     No CVA tenderness, no spinal tenderness  Lungs:     Coarse breathsounds over RLL  Chest wall:    No tenderness to palpitation  Heart:    Regular rate and rhythm without murmurs, gallops, rubs  Abdomen:     Soft, non-tender, nondistended, normal bowel sounds, no organomegaly  Genitalia:    deferred  Rectal:    deferred  Extremities:   No clubbing, cyanosis or edema.  Pulses:   2+ and symmetric all extremities  Skin:   Skin color, texture, turgor normal, no rashes or lesions  Lymph nodes:   Cervical, supraclavicular, and axillary nodes normal  Neurologic:   CNII-XII intact. Normal strength, sensation and reflexes  throughout    Labs on Admission:  Basic Metabolic Panel:  Recent Labs Lab 12/06/14 1845  NA 135*  K 3.7  CL 96  CO2 22  GLUCOSE 126*  BUN 15  CREATININE 0.93  CALCIUM 9.4   Liver Function Tests:  Recent Labs Lab 12/06/14 1845  AST 19  ALT 10  ALKPHOS 83  BILITOT 0.7  PROT 7.6  ALBUMIN 4.0   No results for input(s): LIPASE, AMYLASE in the last 168 hours. No results for input(s): AMMONIA in the last 168 hours. CBC:  Recent Labs Lab 12/06/14 1845  WBC 9.4  NEUTROABS 7.9*  HGB 11.9*  HCT 35.5*  MCV 87.7  PLT 214   Cardiac Enzymes: No results for input(s): CKTOTAL, CKMB, CKMBINDEX, TROPONINI in the last 168 hours.  BNP (last 3 results) No results for input(s): PROBNP in the last 8760  hours. CBG: No results for input(s): GLUCAP in the last 168 hours.  Radiological Exams on Admission: Dg Chest 2 View  12/06/2014   CLINICAL DATA:  The patient had a fall 2 days ago. Pt slipped and landed on her right wrist. Pt is having pain in her wrist. Pt has also had a productive cough x 1-2 weeks that is worse when lying down. H/o asthma.  EXAM: CHEST  2 VIEW  COMPARISON:  03/25/2013  FINDINGS: The heart is normal in size. There is infiltrate in the right lower lobe consistent with infectious process. Small right pleural effusion is present. The left lung is clear. No pulmonary edema.  IMPRESSION: Right lower lobe infiltrate.   Electronically Signed   By: Rosalie GumsBeth  Brown M.D.   On: 12/06/2014 17:10   Dg Wrist Complete Right  12/06/2014   CLINICAL DATA:  Cough.  Pain.  Slipped and fell.  EXAM: RIGHT WRIST - COMPLETE 3+ VIEW  COMPARISON:  None.  FINDINGS: There is mild radiocarpal narrowing without evidence for acute fracture or dislocation. Soft tissues have a normal appearance.  IMPRESSION: 1. Mild degenerative change. 2.  No evidence for acute  abnormality.   Electronically Signed   By: Rosalie GumsBeth  Brown M.D.   On: 12/06/2014 17:12    EKG: Independently reviewed.  Assessment/Plan Principal Problem:   CAP (community acquired pneumonia)   1. CAP 1. CAP pathway 2. IVF 3. Rocephin and azithromycin 4. Cultures pending    Code Status: Full Code  Family Communication: No family in room Disposition Plan: Admit to inpatient   Time spent: 50 min  GARDNER, JARED M. [facility] Pager [phone]  If 7AM-7PM, please contact the day team taking care of the patient [email or web address] Password [email or web address] 12/06/2014, 8:48 PM

## 2014-12-06 NOTE — Progress Notes (Signed)
MD notified due to acute change in patient status. Vital signs and labs are listed below.  MD notified(1st page) Time of 1st page:  2209 Responding MD:  Julian ReilGardner Time MD responded: 2211 MD response: MD stated aware or keep IV fluids at 125cc/hr  Vital Signs Filed Vitals:   12/06/14 2100 12/06/14 2115 12/06/14 2130 12/06/14 2158  BP: 82/53 84/54 84/59  80/58  Pulse: 80 80 79 86  Temp:  99 F (37.2 C)  99.1 F (37.3 C)  TempSrc:  Oral  Oral  Resp:    25  Height:    5' (1.524 m)  Weight:    60.5 kg (133 lb 6.1 oz)  SpO2: 99% 100% 99% 100%     Lab Results WBC  Date/Time Value Ref Range Status  12/06/2014 06:45 PM 9.4 4.0 - 10.5 K/uL Final  09/29/2012 03:43 AM 18.4* 4.0 - 10.5 K/uL Final  09/26/2012 03:30 AM 11.6* 4.0 - 10.5 K/uL Final   NEUTROPHILS RELATIVE %  Date/Time Value Ref Range Status  12/06/2014 06:45 PM 84* 43 - 77 % Final  09/25/2012 07:43 PM 80* 43 - 77 % Final  09/26/2007 05:00 AM 93*  Final   PCO2 ARTERIAL  Date/Time Value Ref Range Status  09/25/2012 09:40 PM 31.0* 35.0 - 45.0 mmHg Final  09/24/2007 11:33 PM 28.1*  Final   LACTIC ACID, VENOUS  Date/Time Value Ref Range Status  12/06/2014 06:58 PM 0.99 0.5 - 2.2 mmol/L Final  12/06/2014 06:45 PM 1.3 0.5 - 2.2 mmol/L Final   No results found for: Carolin GuernseyCO2VEN   Paislyn Domenico L, RN 12/06/2014, 10:14 PM

## 2014-12-06 NOTE — Progress Notes (Signed)
[name] FootmanRobin L [name] [id number] Admission Data: 12/06/2014 10:11 PM Attending Provider: Hillary BowJared M Gardner, DO PCP:No PCP Per Patient Code Status: Full  [name] FootmanRobin L [name] is a 58 y.o. female patient admitted from ED:  -No acute distress noted.  -No complaints of shortness of breath.  -No complaints of chest pain.   Cardiac Monitoring: Box 13  in place. Cardiac monitor yields:1deg AVB.  Blood pressure 80/58, pulse 86, temperature 99.1 F (37.3 C), temperature source Oral, resp. rate 25, height 5' (1.524 m), weight 60.5 kg (133 lb 6.1 oz), SpO2 100 %.   IV Fluids:  IV in place, occlusive dsg intact without redness, IV cath antecubital right, condition patent and no redness and left, condition patent and no redness normal saline.   Allergies:  Review of patient's allergies indicates no known allergies.  Past Medical History:   has a past medical history of Acute bronchitis; Esophageal reflux; Anemia, unspecified; Mild intellectual disabilities; Pneumonia, organism unspecified; Other acute reactions to stress; and Unspecified asthma(493.90).  Past Surgical History:   has past surgical history that includes Tonsillectomy.  Social History:   reports that she has never smoked. She has never used smokeless tobacco. She reports that she does not drink alcohol or use illicit drugs.  Skin: NSI  Patient/Family orientated to room. Information packet given to patient/family. Admission inpatient armband information verified with patient/family to include name and date of birth and placed on patient arm. Side rails up x 2, fall assessment and education completed with patient/family. Patient/family able to verbalize understanding of risk associated with falls and verbalized understanding to call for assistance before getting out of bed. Call light within reach. Patient/family able to voice and demonstrate understanding of unit orientation instructions.

## 2014-12-06 NOTE — ED Notes (Addendum)
Per EMS: pt from home c/o right wrist pain; no obvious deformity noted; pt applied some rub on meds; pt sts from trip and fall; pt also c/o cough and congestion

## 2014-12-07 LAB — BASIC METABOLIC PANEL
ANION GAP: 14 (ref 5–15)
BUN: 10 mg/dL (ref 6–23)
CALCIUM: 8 mg/dL — AB (ref 8.4–10.5)
CO2: 18 mEq/L — ABNORMAL LOW (ref 19–32)
Chloride: 104 mEq/L (ref 96–112)
Creatinine, Ser: 0.63 mg/dL (ref 0.50–1.10)
GFR calc non Af Amer: >90 mL/min (ref >90)
Glucose, Bld: 89 mg/dL (ref 70–99)
Potassium: 3.3 mEq/L — ABNORMAL LOW (ref 3.7–5.3)
Sodium: 136 mEq/L — ABNORMAL LOW (ref 137–147)

## 2014-12-07 LAB — URINALYSIS, ROUTINE W REFLEX MICROSCOPIC
Bilirubin Urine: NEGATIVE
Glucose, UA: NEGATIVE mg/dL
Hgb urine dipstick: NEGATIVE
KETONES UR: 15 mg/dL — AB
LEUKOCYTES UA: NEGATIVE
Nitrite: NEGATIVE
Protein, ur: NEGATIVE mg/dL
Specific Gravity, Urine: 1.018 (ref 1.005–1.030)
UROBILINOGEN UA: 0.2 mg/dL (ref 0.0–1.0)
pH: 5.5 (ref 5.0–8.0)

## 2014-12-07 LAB — BLOOD GAS, ARTERIAL
Acid-base deficit: 3.5 mmol/L — ABNORMAL HIGH (ref 0.0–2.0)
Bicarbonate: 20.4 mEq/L (ref 20.0–24.0)
DRAWN BY: 27733
O2 CONTENT: 2 L/min
O2 Saturation: 98.2 %
PCO2 ART: 33.9 mmHg — AB (ref 35.0–45.0)
PH ART: 7.4 (ref 7.350–7.450)
PO2 ART: 95 mmHg (ref 80.0–100.0)
Patient temperature: 99.2
TCO2: 21.5 mmol/L (ref 0–100)

## 2014-12-07 LAB — CBC
HCT: 28.1 % — ABNORMAL LOW (ref 36.0–46.0)
Hemoglobin: 9.5 g/dL — ABNORMAL LOW (ref 12.0–15.0)
MCH: 30.4 pg (ref 26.0–34.0)
MCHC: 33.8 g/dL (ref 30.0–36.0)
MCV: 89.8 fL (ref 78.0–100.0)
PLATELETS: 158 10*3/uL (ref 150–400)
RBC: 3.13 MIL/uL — AB (ref 3.87–5.11)
RDW: 13.5 % (ref 11.5–15.5)
WBC: 6.1 10*3/uL (ref 4.0–10.5)

## 2014-12-07 LAB — HIV ANTIBODY (ROUTINE TESTING W REFLEX): HIV: NONREACTIVE

## 2014-12-07 LAB — INFLUENZA PANEL BY PCR (TYPE A & B)
H1N1 flu by pcr: NOT DETECTED
Influenza A By PCR: NEGATIVE
Influenza B By PCR: NEGATIVE

## 2014-12-07 MED ORDER — BENZONATATE 100 MG PO CAPS
200.0000 mg | ORAL_CAPSULE | Freq: Three times a day (TID) | ORAL | Status: DC | PRN
  Administered 2014-12-07: 200 mg via ORAL
  Filled 2014-12-07 (×2): qty 2

## 2014-12-07 MED ORDER — GUAIFENESIN ER 600 MG PO TB12
600.0000 mg | ORAL_TABLET | Freq: Two times a day (BID) | ORAL | Status: DC
  Administered 2014-12-07 – 2014-12-09 (×4): 600 mg via ORAL
  Filled 2014-12-07 (×5): qty 1

## 2014-12-07 MED ORDER — ALBUTEROL SULFATE (2.5 MG/3ML) 0.083% IN NEBU
2.5000 mg | INHALATION_SOLUTION | Freq: Four times a day (QID) | RESPIRATORY_TRACT | Status: DC
  Administered 2014-12-07 – 2014-12-08 (×4): 2.5 mg via RESPIRATORY_TRACT
  Filled 2014-12-07 (×4): qty 3

## 2014-12-07 MED ORDER — ALBUTEROL SULFATE (2.5 MG/3ML) 0.083% IN NEBU
2.5000 mg | INHALATION_SOLUTION | RESPIRATORY_TRACT | Status: DC | PRN
Start: 2014-12-07 — End: 2014-12-09

## 2014-12-07 MED ORDER — ACETAMINOPHEN 325 MG PO TABS
650.0000 mg | ORAL_TABLET | Freq: Four times a day (QID) | ORAL | Status: DC | PRN
  Administered 2014-12-08: 650 mg via ORAL
  Filled 2014-12-07: qty 2

## 2014-12-07 MED ORDER — ALBUTEROL SULFATE (2.5 MG/3ML) 0.083% IN NEBU: 2.5000 mg | INHALATION_SOLUTION | RESPIRATORY_TRACT | Status: DC

## 2014-12-07 MED ORDER — POTASSIUM CHLORIDE CRYS ER 20 MEQ PO TBCR
40.0000 meq | EXTENDED_RELEASE_TABLET | Freq: Once | ORAL | Status: AC
  Administered 2014-12-07: 40 meq via ORAL
  Filled 2014-12-07: qty 2

## 2014-12-07 NOTE — Progress Notes (Signed)
Orthopedic Tech Progress Note Patient Details:  [name] FootmanRobin L [name] 09-30-1956 [id number]  Ortho Devices Type of Ortho Device: Velcro wrist splint Ortho Device/Splint Location: rue Ortho Device/Splint Interventions: Application   Aren Cherne 12/07/2014, 10:42 AM

## 2014-12-07 NOTE — Progress Notes (Signed)
[facility] PROGRESS NOTE  [name] [name] [id number] DOB: [birth year] DOA: 12/06/2014 PCP: No PCP Per Patient  Assessment/Plan: 1-Community Acquired PNA;  Continue with IV ceftriaxone and Azithromycin.  Check ABG.   2-Sepsis; secondary to PNA. Continue with IV fluids, IV antibiotics.   3-Mild metabolic acidosis; continue with IV fluids.   4-Hypokalemia; replete with oral KCl.  5-Acute hypoxic respiratory failure; secondary to PNA.  Continue with nebulizer treatments prior history of asthma.    Code Status: full code.  Family Communication: Care discussed with patient.  Disposition Plan: home when stable.    Consultants:  none  Procedures:  none  Antibiotics:  Ceftriaxone 12-13  Azithromycin 12-13  HPI/Subjective: Complaining of headaches.  Feels she is breathing better than yesterday.   Objective: Filed Vitals:   12/07/14 0509  BP: 97/71  Pulse: 84  Temp: 99.2 F (37.3 C)  Resp: 23    Intake/Output Summary (Last 24 hours) at 12/07/14 0929 Last data filed at 12/07/14 [id number]  Gross per 24 hour  Intake 2839.17 ml  Output    300 ml  Net 2539.17 ml   Filed Weights   12/06/14 2158  Weight: 60.5 kg (133 lb 6.1 oz)    Exam:   General:  No distress.   Cardiovascular: S 1, S 2 RRR  Respiratory: bilateral ronchus, no significant wheezing.   Abdomen: Bs present, soft, NT  Musculoskeletal: no edema.   Data Reviewed: Basic Metabolic Panel:  Recent Labs Lab 12/06/14 1845 12/07/14 0530  NA 135* 136*  K 3.7 3.3*  CL 96 104  CO2 22 18*  GLUCOSE 126* 89  BUN 15 10  CREATININE 0.93 0.63  CALCIUM 9.4 8.0*   Liver Function Tests:  Recent Labs Lab 12/06/14 1845  AST 19  ALT 10  ALKPHOS 83  BILITOT 0.7  PROT 7.6  ALBUMIN 4.0   No results for input(s): LIPASE, AMYLASE in the last 168 hours. No results for input(s): AMMONIA in the last 168 hours. CBC:  Recent Labs Lab 12/06/14 1845 12/07/14 0530  WBC 9.4 6.1  NEUTROABS  7.9*  --   HGB 11.9* 9.5*  HCT 35.5* 28.1*  MCV 87.7 89.8  PLT 214 158   Cardiac Enzymes: No results for input(s): CKTOTAL, CKMB, CKMBINDEX, TROPONINI in the last 168 hours. BNP (last 3 results) No results for input(s): PROBNP in the last 8760 hours. CBG: No results for input(s): GLUCAP in the last 168 hours.  No results found for this or any previous visit (from the past 240 hour(s)).   Studies: Dg Chest 2 View  12/06/2014   CLINICAL DATA:  The patient had a fall 2 days ago. Pt slipped and landed on her right wrist. Pt is having pain in her wrist. Pt has also had a productive cough x 1-2 weeks that is worse when lying down. H/o asthma.  EXAM: CHEST  2 VIEW  COMPARISON:  03/25/2013  FINDINGS: The heart is normal in size. There is infiltrate in the right lower lobe consistent with infectious process. Small right pleural effusion is present. The left lung is clear. No pulmonary edema.  IMPRESSION: Right lower lobe infiltrate.   Electronically Signed   By: Rosalie GumsBeth  Brown M.D.   On: 12/06/2014 17:10   Dg Wrist Complete Right  12/06/2014   CLINICAL DATA:  Cough.  Pain.  Slipped and fell.  EXAM: RIGHT WRIST - COMPLETE 3+ VIEW  COMPARISON:  None.  FINDINGS: There is mild radiocarpal narrowing without evidence for acute fracture or  dislocation. Soft tissues have a normal appearance.  IMPRESSION: 1. Mild degenerative change. 2.  No evidence for acute  abnormality.   Electronically Signed   By: Rosalie GumsBeth  Brown M.D.   On: 12/06/2014 17:12    Scheduled Meds: . azithromycin  500 mg Intravenous Q24H  . cefTRIAXone (ROCEPHIN)  IV  1 g Intravenous Q24H  . heparin  5,000 Units Subcutaneous 3 times per day  . Influenza vac split quadrivalent PF  0.5 mL Intramuscular Tomorrow-1000  . pantoprazole  40 mg Oral Q1200  . pneumococcal 23 valent vaccine  0.5 mL Intramuscular Tomorrow-1000   Continuous Infusions: . sodium chloride 125 mL/hr at 12/07/14 [id number]    Principal Problem:   CAP (community acquired  pneumonia)    Time spent: 35 minutes.     Hartley Barefootegalado, Chiana Wamser A  [facility] Pager [phone]. If 7PM-7AM, please contact night-coverage at [email or web address], password [email or web address] 12/07/2014, 9:29 AM  LOS: 1 day

## 2014-12-08 ENCOUNTER — Ambulatory Visit: Payer: Medicare HMO | Admitting: Internal Medicine

## 2014-12-08 DIAGNOSIS — A419 Sepsis, unspecified organism: Secondary | ICD-10-CM | POA: Diagnosis not present

## 2014-12-08 LAB — BASIC METABOLIC PANEL
ANION GAP: 12 (ref 5–15)
BUN: 8 mg/dL (ref 6–23)
CHLORIDE: 109 meq/L (ref 96–112)
CO2: 19 mEq/L (ref 19–32)
Calcium: 8.2 mg/dL — ABNORMAL LOW (ref 8.4–10.5)
Creatinine, Ser: 0.55 mg/dL (ref 0.50–1.10)
GFR calc Af Amer: >90 mL/min (ref >90)
GFR calc non Af Amer: >90 mL/min (ref >90)
Glucose, Bld: 89 mg/dL (ref 70–99)
POTASSIUM: 3.4 meq/L — AB (ref 3.7–5.3)
Sodium: 140 mEq/L (ref 137–147)

## 2014-12-08 LAB — RETICULOCYTES
RBC.: 3.6 MIL/uL — ABNORMAL LOW (ref 3.87–5.11)
Retic Ct Pct: <0.4 % — ABNORMAL LOW (ref 0.4–3.1)

## 2014-12-08 LAB — CBC
HCT: 28.6 % — ABNORMAL LOW (ref 36.0–46.0)
Hemoglobin: 9.7 g/dL — ABNORMAL LOW (ref 12.0–15.0)
MCH: 29.9 pg (ref 26.0–34.0)
MCHC: 33.9 g/dL (ref 30.0–36.0)
MCV: 88.3 fL (ref 78.0–100.0)
PLATELETS: 148 10*3/uL — AB (ref 150–400)
RBC: 3.24 MIL/uL — AB (ref 3.87–5.11)
RDW: 13.3 % (ref 11.5–15.5)
WBC: 3.5 10*3/uL — AB (ref 4.0–10.5)

## 2014-12-08 LAB — IRON AND TIBC
IRON: 14 ug/dL — AB (ref 42–135)
Saturation Ratios: 6 % — ABNORMAL LOW (ref 20–55)
TIBC: 251 ug/dL (ref 250–470)
UIBC: 237 ug/dL (ref 125–400)

## 2014-12-08 MED ORDER — POTASSIUM CHLORIDE CRYS ER 20 MEQ PO TBCR
40.0000 meq | EXTENDED_RELEASE_TABLET | Freq: Once | ORAL | Status: AC
  Administered 2014-12-08: 40 meq via ORAL
  Filled 2014-12-08: qty 2

## 2014-12-08 MED ORDER — SODIUM CHLORIDE 0.9 % IV SOLN
INTRAVENOUS | Status: DC
  Administered 2014-12-08: 21:00:00 via INTRAVENOUS

## 2014-12-08 MED ORDER — ALBUTEROL SULFATE (2.5 MG/3ML) 0.083% IN NEBU
2.5000 mg | INHALATION_SOLUTION | Freq: Three times a day (TID) | RESPIRATORY_TRACT | Status: DC
  Administered 2014-12-08 – 2014-12-09 (×3): 2.5 mg via RESPIRATORY_TRACT
  Filled 2014-12-08 (×4): qty 3

## 2014-12-08 NOTE — Plan of Care (Signed)
Problem: Phase II Progression Outcomes Goal: Wean O2 if indicated Outcome: Progressing Pt is on Room air now

## 2014-12-08 NOTE — Progress Notes (Signed)
Nutrition Brief Note  Patient identified on the Malnutrition Screening Tool (MST) Report  Wt Readings from Last 15 Encounters:  12/06/14 133 lb 6.1 oz (60.5 kg)  08/30/13 133 lb 6.4 oz (60.51 kg)  05/31/13 132 lb (59.875 kg)  04/24/13 137 lb (62.143 kg)  04/19/13 137 lb 3.2 oz (62.234 kg)  04/09/13 141 lb (63.957 kg)  12/06/12 135 lb (61.236 kg)  09/26/12 136 lb 6.4 oz (61.871 kg)  08/17/12 136 lb 3.2 oz (61.78 kg)  07/07/11 127 lb 9.6 oz (57.879 kg)  03/25/10 128 lb 6.1 oz (58.233 kg)  03/26/09 137 lb 6.1 oz (62.316 kg)  02/26/09 133 lb 2.1 oz (60.388 kg)  05/01/08 124 lb 8 oz (56.473 kg)    Body mass index is 26.05 kg/(m^2). Patient meets criteria for overweight based on current BMI.   Current diet order is regular, patient is consuming approximately 100% of meals at this time. Labs and medications reviewed.   No nutrition interventions warranted at this time. If nutrition issues arise, please consult RD.   Emmaline KluverHaley Timarion Agcaoili MS, RD, LDN

## 2014-12-08 NOTE — Evaluation (Signed)
Occupational Therapy Evaluation Patient Details Name: [name] FootmanRobin [name] [name] MRN: [id number] DOB: 1956/10/09 Today's Date: 12/08/2014    History of Present Illness 58 y.o. female admitted with Community Acquired PNA, sepsis, and acute hypoxic respiratory failure.   Clinical Impression   Pt admitted with above. Education provided during session and wrote down information for pt. OT signing off.    Follow Up Recommendations  No OT follow up;Supervision - Intermittent    Equipment Recommendations  3 in 1 bedside comode    Recommendations for Other Services       Precautions / Restrictions Precautions Precautions: Fall Restrictions Weight Bearing Restrictions: No      Mobility Bed Mobility               General bed mobility comments: not assessed  Transfers Overall transfer level: Modified independent Equipment used: None                  Balance Overall balance assessment: History of Falls                                          ADL Overall ADL's : Needs assistance/impaired         Upper Body Bathing: Set up;Supervision/ safety;Standing   Lower Body Bathing: Set up;Supervison/ safety (standing)   Upper Body Dressing : Set up;Supervision/safety;Standing   Lower Body Dressing: Set up;Supervision/safety;Sit to/from stand   Toilet Transfer: Supervision/safety;Ambulation;Regular Toilet   Toileting- ArchitectClothing Manipulation and Hygiene: Supervision/safety;Sit to/from stand   Tub/ Shower Transfer: Supervision/safety;Ambulation   Functional mobility during ADLs: Supervision/safety General ADL Comments: Educated on safety tips for home (safe shoewear, rugs, pets, sitting for most of LB ADLs)-pt reports falls at home. Recommended sister be with her for first few times for tub transfer. Educated on energy conservation techniques. Pt verbalized she knows how to donn/doff right splint. Wrote down some information for pt to help her remember.  Educated on deep breathing technique.  Educated on safe tub transfer technique and pt practiced.     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")                     General Comments       Exercises       Shoulder Instructions      Home Living Family/patient expects to be discharged to:: Private residence Living Arrangements: Other relatives (sister) Available Help at Discharge: Family;Available 24 hours/day Type of Home: House Home Access: Stairs to enter Entergy CorporationEntrance Stairs-Number of Steps: 2 Entrance Stairs-Rails: None Home Layout: One level     Bathroom Shower/Tub: Chief Strategy OfficerTub/shower unit   Bathroom Toilet: Standard     Home Equipment:  (has a little clear bar in tub)  Prior Functioning/Environment Level of Independence: Independent             OT Diagnosis: Other (comment) (decreased activity tolerance)   OT Problem List:     OT Treatment/Interventions:      OT Goals(Current goals can be found in the care plan section)    OT Frequency:     Barriers to D/C:            Co-evaluation              End of Session Equipment Utilized During Treatment: Gait belt Nurse Communication: Mobility status  Activity Tolerance: Patient tolerated treatment well Patient left: in chair;with call bell/phone within  reach;with chair alarm set   Time: [id number] OT Time Calculation (min): 32 min Charges:  OT General Charges $OT Visit: 1 Procedure OT Evaluation $Initial OT Evaluation Tier I: 1 Procedure OT Treatments $Self Care/Home Management : 8-22 mins G-CodesEarlie [name]:    [name] [name] OTR/[name] [phone] 12/08/2014, 5:41 PM

## 2014-12-08 NOTE — Progress Notes (Signed)
Medicare Important Message given?  YES (If response is "NO", the following Medicare IM given date fields will be blank) Date Medicare IM given:  12/08/14 Medicare IM given by:  Rockland Kotarski 

## 2014-12-08 NOTE — Progress Notes (Signed)
[facility] PROGRESS NOTE  [name] [name] ZOX:[id number] DOB: [birth year] DOA: 12/06/2014 PCP: No PCP Per Patient  Assessment/Plan: 1-Community Acquired PNA;  Continue with IV ceftriaxone and Azithromycin day 2.   ABG normal.  Strep PNA negative.  Blood culture pending.   2-Sepsis; secondary to PNA. Continue with IV antibiotics. Iv fluids.   3-Mild metabolic acidosis; resolved. continue with IV fluids.   4-Hypokalemia; replete with oral KCl.  5-Acute hypoxic respiratory failure; secondary to PNA.  Continue with nebulizer treatments prior history of asthma.   6-anemia; check anemia panel.   Code Status: full code.  Family Communication: Care discussed with patient.  Disposition Plan: home when stable.    Consultants:  none  Procedures:  none  Antibiotics:  Ceftriaxone 12-13  Azithromycin 12-13  HPI/Subjective: Feeling better, breathing better.   Objective: Filed Vitals:   12/08/14 0933  BP: 101/71  Pulse: 80  Temp: 97.5 F (36.4 C)  Resp: 18    Intake/Output Summary (Last 24 hours) at 12/08/14 1418 Last data filed at 12/08/14 0925  Gross per 24 hour  Intake   2600 ml  Output   1625 ml  Net    975 ml   Filed Weights   12/06/14 2158  Weight: 60.5 kg (133 lb 6.1 oz)    Exam:   General:  No distress.   Cardiovascular: S 1, S 2 RRR  Respiratory: bilateral ronchus, no significant wheezing.   Abdomen: Bs present, soft, NT  Musculoskeletal: no edema.   Data Reviewed: Basic Metabolic Panel:  Recent Labs Lab 12/06/14 1845 12/07/14 0530 12/08/14 0630  NA 135* 136* 140  K 3.7 3.3* 3.4*  CL 96 104 109  CO2 22 18* 19  GLUCOSE 126* 89 89  BUN 15 10 8   CREATININE 0.93 0.63 0.55  CALCIUM 9.4 8.0* 8.2*   Liver Function Tests:  Recent Labs Lab 12/06/14 1845  AST 19  ALT 10  ALKPHOS 83  BILITOT 0.7  PROT 7.6  ALBUMIN 4.0   No results for input(s): LIPASE, AMYLASE in the last 168 hours. No results for input(s): AMMONIA in  the last 168 hours. CBC:  Recent Labs Lab 12/06/14 1845 12/07/14 0530 12/08/14 0630  WBC 9.4 6.1 3.5*  NEUTROABS 7.9*  --   --   HGB 11.9* 9.5* 9.7*  HCT 35.5* 28.1* 28.6*  MCV 87.7 89.8 88.3  PLT 214 158 148*   Cardiac Enzymes: No results for input(s): CKTOTAL, CKMB, CKMBINDEX, TROPONINI in the last 168 hours. BNP (last 3 results) No results for input(s): PROBNP in the last 8760 hours. CBG: No results for input(s): GLUCAP in the last 168 hours.  Recent Results (from the past 240 hour(s))  Blood Culture (routine x 2)     Status: None (Preliminary result)   Collection Time: 12/06/14  6:40 PM  Result Value Ref Range Status   Specimen Description BLOOD LEFT ARM  Final   Special Requests BOTTLES DRAWN AEROBIC AND ANAEROBIC 5CC  Final   Culture  Setup Time   Final    12/07/2014 04:08 Performed at [facility]    Culture   Final           BLOOD CULTURE RECEIVED NO GROWTH TO DATE CULTURE WILL BE HELD FOR 5 DAYS BEFORE ISSUING A FINAL NEGATIVE REPORT Performed at [facility]    Report Status PENDING  Incomplete  Blood Culture (routine x 2)     Status: None (Preliminary result)   Collection Time: 12/06/14  6:45  PM  Result Value Ref Range Status   Specimen Description BLOOD RIGHT ARM  Final   Special Requests BOTTLES DRAWN AEROBIC AND ANAEROBIC 5CC  Final   Culture  Setup Time   Final    12/07/2014 04:08 Performed at [facility]    Culture   Final           BLOOD CULTURE RECEIVED NO GROWTH TO DATE CULTURE WILL BE HELD FOR 5 DAYS BEFORE ISSUING A FINAL NEGATIVE REPORT Performed at [facility]    Report Status PENDING  Incomplete     Studies: Dg Chest 2 View  12/06/2014   CLINICAL DATA:  The patient had a fall 2 days ago. Pt slipped and landed on her right wrist. Pt is having pain in her wrist. Pt has also had a productive cough x 1-2 weeks that is worse when lying down. H/o asthma.  EXAM: CHEST  2 VIEW  COMPARISON:  03/25/2013  FINDINGS:  The heart is normal in size. There is infiltrate in the right lower lobe consistent with infectious process. Small right pleural effusion is present. The left lung is clear. No pulmonary edema.  IMPRESSION: Right lower lobe infiltrate.   Electronically Signed   By: Rosalie GumsBeth  Brown M.D.   On: 12/06/2014 17:10   Dg Wrist Complete Right  12/06/2014   CLINICAL DATA:  Cough.  Pain.  Slipped and fell.  EXAM: RIGHT WRIST - COMPLETE 3+ VIEW  COMPARISON:  None.  FINDINGS: There is mild radiocarpal narrowing without evidence for acute fracture or dislocation. Soft tissues have a normal appearance.  IMPRESSION: 1. Mild degenerative change. 2.  No evidence for acute  abnormality.   Electronically Signed   By: Rosalie GumsBeth  Brown M.D.   On: 12/06/2014 17:12    Scheduled Meds: . albuterol  2.5 mg Nebulization TID  . azithromycin  500 mg Intravenous Q24H  . cefTRIAXone (ROCEPHIN)  IV  1 g Intravenous Q24H  . guaiFENesin  600 mg Oral BID  . heparin  5,000 Units Subcutaneous 3 times per day  . pantoprazole  40 mg Oral Q1200  . potassium chloride  40 mEq Oral Once   Continuous Infusions:    Principal Problem:   CAP (community acquired pneumonia)    Time spent: 35 minutes.     Hartley Barefootegalado, Belkys A  [facility] Pager [phone]. If 7PM-7AM, please contact night-coverage at [email or web address], password [email or web address] 12/08/2014, 2:18 PM  LOS: 2 days

## 2014-12-08 NOTE — Evaluation (Signed)
Physical Therapy Evaluation Patient Details Name: [name] FootmanRobin L [name] MRN: [id number] DOB: [birth year] Today's Date: 12/08/2014   History of Present Illness  58 y.o. female admitted with Community Acquired PNA, sepsis, and acute hypoxic respiratory failure.  Clinical Impression  Pt admitted with the above diagnosis. Pt currently with functional limitations due to the deficits listed below (see PT Problem List). Ambulates generally well however losses balance with more challenging dynamic gait tasks, and static balance activities involving RLE. SpO2 100% on room air during therapy session. Reports she noticed difficulty with balance approximately 2 weeks ago and has had a fall at home while moping her floor last week. Would benefit from home assessment and evaluation with HHPT at d/c. Lives with sister and has 24hr care. Pt will benefit from skilled PT to increase their independence and safety with mobility to allow discharge to the venue listed below.       Follow Up Recommendations Home health PT    Equipment Recommendations  Cane    Recommendations for Other Services       Precautions / Restrictions Precautions Precautions: Fall Restrictions Weight Bearing Restrictions: No      Mobility  Bed Mobility                  Transfers Overall transfer level: Modified independent Equipment used: None                Ambulation/Gait Ambulation/Gait assistance: Min guard Ambulation Distance (Feet): 250 Feet Assistive device: None Gait Pattern/deviations: Step-through pattern;Staggering left   Gait velocity interpretation: at or above normal speed for age/gender General Gait Details: Ambulates generally well however mild stagger towards pt left side noted with more challenging gait tasks such as high marching. pt require min guard for safety. Losses baland towards her left side when she ambulates slowly and tends to furniture walk.  Stairs            Wheelchair  Mobility    Modified Rankin (Stroke Patients Only)       Balance Overall balance assessment: Needs assistance;History of Falls Sitting-balance support: No upper extremity supported;Feet supported Sitting balance-Leahy Scale: Normal     Standing balance support: No upper extremity supported Standing balance-Leahy Scale: Fair Standing balance comment: .15 Single Leg Stance - Right Leg: 0 (Loss of balance) Single Leg Stance - Left Leg: 0.15 Tandem Stance - Right Leg: 0 (Loss of balance)         High Level Balance Comments: High marching. Loss of balance to left             Pertinent Vitals/Pain Pain Assessment: No/denies pain    Home Living Family/patient expects to be discharged to:: Private residence Living Arrangements: Other relatives (Sister) Available Help at Discharge: Family;Available 24 hours/day Type of Home: House Home Access: Stairs to enter Entrance Stairs-Rails: None Entrance Stairs-Number of Steps: 2 Home Layout: One level Home Equipment: None      Prior Function Level of Independence: Independent               Hand Dominance   Dominant Hand: Right    Extremity/Trunk Assessment   Upper Extremity Assessment: Defer to OT evaluation           Lower Extremity Assessment: Overall WFL for tasks assessed (Normal strength and light touch sensation)         Communication   Communication: No difficulties  Cognition Arousal/Alertness: Awake/alert Behavior During Therapy: WFL for tasks assessed/performed Overall Cognitive Status: History of cognitive impairments -  at baseline ("Mild intellectual disability")                      General Comments General comments (skin integrity, edema, etc.): SpO2 100% on room air -- HR 90    Exercises        Assessment/Plan    PT Assessment Patient needs continued PT services  PT Diagnosis Abnormality of gait;Difficulty walking   PT Problem List Decreased balance;Decreased  mobility;Decreased knowledge of use of DME  PT Treatment Interventions DME instruction;Gait training;Stair training;Functional mobility training;Therapeutic activities;Therapeutic exercise;Balance training;Neuromuscular re-education;Patient/family education   PT Goals (Current goals can be found in the Care Plan section) Acute Rehab PT Goals Patient Stated Goal: None stated PT Goal Formulation: With patient Time For Goal Achievement: 12/22/14 Potential to Achieve Goals: Good    Frequency Min 3X/week   Barriers to discharge        Co-evaluation               End of Session Equipment Utilized During Treatment: Gait belt Activity Tolerance: Patient tolerated treatment well Patient left: in chair;with call bell/phone within reach;with chair alarm set Nurse Communication: Mobility status         Time: 1305-1330 PT Time Calculation (min) (ACUTE ONLY): 25 min   Charges:   PT Evaluation $Initial PT Evaluation Tier I: 1 Procedure PT Treatments $Gait Training: 8-22 mins   PT G CodesBerton Mount:          Neely Cecena S 12/08/2014, 1:54 PM  Sunday SpillersLogan Secor Big Rock[place]bour, [place] CarolinaPT [phone]

## 2014-12-09 LAB — BASIC METABOLIC PANEL
ANION GAP: 11 (ref 5–15)
BUN: 6 mg/dL (ref 6–23)
CALCIUM: 8.3 mg/dL — AB (ref 8.4–10.5)
CHLORIDE: 110 meq/L (ref 96–112)
CO2: 20 meq/L (ref 19–32)
Creatinine, Ser: 0.54 mg/dL (ref 0.50–1.10)
GFR calc Af Amer: >90 mL/min (ref >90)
GFR calc non Af Amer: >90 mL/min (ref >90)
Glucose, Bld: 93 mg/dL (ref 70–99)
POTASSIUM: 3.7 meq/L (ref 3.7–5.3)
SODIUM: 141 meq/L (ref 137–147)

## 2014-12-09 LAB — VITAMIN B12: Vitamin B-12: 814 pg/mL (ref 211–911)

## 2014-12-09 LAB — FOLATE

## 2014-12-09 LAB — LEGIONELLA ANTIGEN, URINE

## 2014-12-09 LAB — FERRITIN: FERRITIN: 101 ng/mL (ref 10–291)

## 2014-12-09 MED ORDER — BENZONATATE 200 MG PO CAPS: 200.0000 mg | ORAL_CAPSULE | Freq: Three times a day (TID) | ORAL | Status: DC | PRN

## 2014-12-09 MED ORDER — ALBUTEROL SULFATE (2.5 MG/3ML) 0.083% IN NEBU: 3.0000 mL | INHALATION_SOLUTION | Freq: Four times a day (QID) | RESPIRATORY_TRACT | Status: DC | PRN

## 2014-12-09 MED ORDER — ALBUTEROL SULFATE HFA 108 (90 BASE) MCG/ACT IN AERS: 2.0000 | INHALATION_SPRAY | RESPIRATORY_TRACT | Status: DC | PRN

## 2014-12-09 MED ORDER — GUAIFENESIN ER 600 MG PO TB12: 600.0000 mg | ORAL_TABLET | Freq: Two times a day (BID) | ORAL | Status: DC

## 2014-12-09 MED ORDER — LEVOFLOXACIN 750 MG PO TABS: 750.0000 mg | ORAL_TABLET | Freq: Every day | ORAL | Status: DC

## 2014-12-09 NOTE — Progress Notes (Signed)
NURSING PROGRESS NOTE  [name] FootmanRobin L Haxton [id number] Discharge Data: 12/09/2014 2:40 PM Attending Provider: Alba CoryBelkys A Regalado, MD PCP:No PCP Per Patient     [name] Footmanobin L Reeg to be D/C'd Home per MD order.  Discussed with the patient the After Visit Summary and all questions fully answered. All IV's discontinued with no bleeding noted. All belongings returned to patient for patient to take home.   Last Vital Signs:  Blood pressure 121/82, pulse 80, temperature 98.6 F (37 C), temperature source Oral, resp. rate 18, height 5' (1.524 m), weight 60.5 kg (133 lb 6.1 oz), SpO2 96 %.  Discharge Medication List   Medication List    STOP taking these medications        oxyCODONE-acetaminophen 5-325 MG per tablet  Commonly known as:  PERCOCET/ROXICET      TAKE these medications        albuterol 108 (90 BASE) MCG/ACT inhaler  Commonly known as:  PROVENTIL HFA;VENTOLIN HFA  Inhale 2 puffs into the lungs every 4 (four) hours as needed for wheezing or shortness of breath.     benzonatate 200 MG capsule  Commonly known as:  TESSALON  Take 1 capsule (200 mg total) by mouth 3 (three) times daily as needed for cough.     guaiFENesin 600 MG 12 hr tablet  Commonly known as:  MUCINEX  Take 1 tablet (600 mg total) by mouth 2 (two) times daily.     levofloxacin 750 MG tablet  Commonly known as:  LEVAQUIN  Take 1 tablet (750 mg total) by mouth daily. Start Sunday 12/13     pantoprazole 40 MG tablet  Commonly known as:  PROTONIX  Take 1 tablet (40 mg total) by mouth daily at 12 noon.

## 2014-12-09 NOTE — Progress Notes (Signed)
Physical Therapy Treatment Patient Details Name: [name] FootmanRobin L [name] MRN: [id number] DOB: 1956-07-08 Today's Date: 12/09/2014    History of Present Illness 58 y.o. female admitted with Community Acquired PNA, sepsis, and acute hypoxic respiratory failure.    PT Comments    Pt able to safely perform gait training using SPC. Pt educated on how to use cane and how to adjust her cane to fit her once she gets one. Pt able to perform head turning and changing speed during walking without loss of balance today. Pt with improved safety with ambulation during today's session.    Follow Up Recommendations  Home health PT     Equipment Recommendations  Cane    Recommendations for Other Services       Precautions / Restrictions Precautions Precautions: Fall Restrictions Weight Bearing Restrictions: No    Mobility  Bed Mobility               General bed mobility comments: Not assessed as pt sitting up in chair when PT entered room.   Transfers Overall transfer level: Modified independent Equipment used: None             General transfer comment: Pt able to stand from recliner chair safely without pt assistance or cuing. Pushed on armrests of chair to get up.   Ambulation/Gait Ambulation/Gait assistance: Min guard Ambulation Distance (Feet): 350 Feet Assistive device: Straight cane Gait Pattern/deviations: Step-through pattern;Decreased stride length   Gait velocity interpretation: Below normal speed for age/gender General Gait Details: Pt educated on use of SPC during ambulation and able to safely ambulate in hallway using cane for extra stability. Pt stated she felt more comfortable having the cane than ambulating without it. Pt ambulated in middle of hallway and did not reach for other support during walk. Pt without loss of balance and staggering with head turning and gait changing challenges, however, pt did slow down when performing head turning  challenges.   Stairs            Wheelchair Mobility    Modified Rankin (Stroke Patients Only)       Balance Overall balance assessment: Needs assistance Sitting-balance support: No upper extremity supported;Feet supported Sitting balance-Leahy Scale: Normal     Standing balance support: No upper extremity supported Standing balance-Leahy Scale: Fair Standing balance comment: Pt able to stand statically without support. Able to ambulate with cane with improved safety.                     Cognition Arousal/Alertness: Awake/alert Behavior During Therapy: WFL for tasks assessed/performed Overall Cognitive Status: History of cognitive impairments - at baseline (Mild intellectual disability)                      Exercises Total Joint Exercises Towel Squeeze: Strengthening;Both;10 reps;Seated General Exercises - Lower Extremity Ankle Circles/Pumps: AROM;Both;10 reps;Seated Gluteal Sets: Strengthening;Both;10 reps;Seated Long Arc Quad: Strengthening;Both;10 reps;Seated Hip Flexion/Marching: AROM;Both;10 reps;Seated    General Comments General comments (skin integrity, edema, etc.): Pt SpO2 throughout session on RA was 96 -100%      Pertinent Vitals/Pain Pain Assessment: No/denies pain  SpO2: 96-100% throughout ambulation     Home Living                      Prior Function            PT Goals (current goals can now be found in the care plan section) Acute Rehab PT Goals  PT Goal Formulation: With patient Time For Goal Achievement: 12/22/14 Potential to Achieve Goals: Good Progress towards PT goals: Progressing toward goals    Frequency  Min 3X/week    PT Plan Current plan remains appropriate    Co-evaluation             End of Session Equipment Utilized During Treatment: Gait belt Activity Tolerance: Patient tolerated treatment well Patient left: in chair;with call bell/phone within reach;with chair alarm set;with  nursing/sitter in room     Time: 1610-96041405-1423 PT Time Calculation (min) (ACUTE ONLY): 18 min  Charges:  $Gait Training: 8-22 mins                    G Codes:      [name] [name], [name] GumKenneth V [name] 12/09/2014, 4:53 PM  [name] SpanielOlivia [name], [name]  Acute Rehabilitation [phone]  12/09/2014  [place] BingKen Kaan [name], PT [phone]  (pager)

## 2014-12-09 NOTE — Care Management Note (Signed)
    Page 1 of 2   12/09/2014     12:36:53 PM CARE MANAGEMENT NOTE 12/09/2014  Patient:  [name] FootmanCOCHRAN,Janiyla L   Account Number:  [id number]  Date Initiated:  12/09/2014  Documentation initiated by:  Letha CapeAYLOR,Mylei Brackeen  Subjective/Objective Assessment:   dx pna  admit- lives with sister.     Action/Plan:   pt eval- rec hhpt and cane.   Anticipated DC Date:  12/09/2014   Anticipated DC Plan:  HOME W HOME HEALTH SERVICES      DC Planning Services  CM consult      [facility]   Choice offered to / List presented to:  C-1 Patient        HH arranged  HH-2 PT      [facility]   Status of service:  Completed, signed off Medicare Important Message given?  YES (If response is "NO", the following Medicare IM given date fields will be blank) Date Medicare IM given:  12/08/2014 Medicare IM given by:  Letha CapeAYLOR,Jonne Rote Date Additional Medicare IM given:   Additional Medicare IM given by:    Discharge Disposition:  HOME W HOME HEALTH SERVICES  Per UR Regulation:  Reviewed for med. necessity/level of care/duration of stay  If discussed at Long Length of Stay Meetings, dates discussed:    Comments:  12/09/14 1232 Letha Capeeborah Truitt Cruey RN, BSN [phone] patient lives with sister at [address], [place] [address] phone [phone].  [facility], but they were not in network so [facility], [facility], Mirianda notifed for hhpt.  Soc will begin 24- 48 hrs post dc.  NCM informed patient that it will be cheaper to get can from [facility] , [facility] or [facility] than go through insurance and pluse if she needed to get a rolling walker later she would not be able to get one til after 5years since she got cane thru insurance, patient states that she will purchase her cane her self.  Patient states she may not have a way to get to [facility] apt on 12/17, CSW will give her a bus pass.

## 2014-12-09 NOTE — Discharge Summary (Signed)
Physician Discharge Summary  [name] [name] Welsch WGN:[id number] DOB: Dec 13, 1956 DOA: 12/06/2014  PCP: No PCP Per Patient  Admit date: 12/06/2014 Discharge date: 12/09/2014  Time spent: 35 minutes  Recommendations for Outpatient Follow-up:  1. Need iron supplement. Need screening colonoscopy 2. Needs follow up for resolution of PNA  Discharge Diagnoses:    CAP (community acquired pneumonia)   Sepsis   Mild metabolic acidosis.    History of asthma.   Discharge Condition: Stable.   Diet recommendation: heart Healthy  Filed Weights   12/06/14 2158  Weight: 60.5 kg (133 lb 6.1 oz)    History of present illness:  [name] [name] Cadiente is a 58 y.o. female who presents to the ED with cough, SOB. Symptoms onset in mid Nov, seemed to get better but have gotten worse again in this past week. Cough is non productive, has been out of [facility] for asthma for a "long time", has history of bronchitis and asthma in the past.  Hospital Course:  1-Community Acquired PNA;  Received IV ceftriaxone and Azithromycin day 3.  Patient will be discharge on Levaquin for 5 more days.  ABG normal.  Strep PNA negative.  Blood culture no growth to date.   2-Sepsis; secondary to PNA. Continue with IV antibiotics. Iv fluids.   3-Mild metabolic acidosis; resolved. continue with IV fluids.   4-Hypokalemia; replete with oral KCl.  5-Acute hypoxic respiratory failure; secondary to PNA.  Continue with nebulizer treatments prior history of asthma.   6-anemia;  anemia panel came back. Iron deficiency anemia,. Need screening colonoscopy and iron supplement.   Procedures:  none  Consultations:  none  Discharge Exam: Filed Vitals:   12/09/14 0818  BP: 119/85  Pulse: 93  Temp: 97.9 F (36.6 C)  Resp: 18    General: Alert in no distress.  Cardiovascular: S 1, S 2 RRR Respiratory: bilateral ronchus  Discharge Instructions You were cared for by a hospitalist during your hospital stay. If you have any  questions about your discharge medications or the care you received while you were in the hospital after you are discharged, you can call the unit and asked to speak with the hospitalist on call if the hospitalist that took care of you is not available. Once you are discharged, your primary care physician will handle any further medical issues. Please note that NO REFILLS for any discharge medications will be authorized once you are discharged, as it is imperative that you return to your primary care physician (or establish a relationship with a primary care physician if you do not have one) for your aftercare needs so that they can reassess your need for medications and monitor your lab values.  Discharge Instructions    Diet - low sodium heart healthy    Complete by:  As directed      Increase activity slowly    Complete by:  As directed           Current Discharge Medication List    START taking these medications   Details  albuterol (PROVENTIL HFA;VENTOLIN HFA) 108 (90 BASE) MCG/ACT inhaler Inhale 2 puffs into the lungs every 4 (four) hours as needed for wheezing or shortness of breath. Qty: 1 Inhaler, Refills: 1    benzonatate (TESSALON) 200 MG capsule Take 1 capsule (200 mg total) by mouth 3 (three) times daily as needed for cough. Qty: 20 capsule, Refills: 0    guaiFENesin (MUCINEX) 600 MG 12 hr tablet Take 1 tablet (600 mg total) by mouth  2 (two) times daily. Qty: 30 tablet, Refills: 0    levofloxacin (LEVAQUIN) 750 MG tablet Take 1 tablet (750 mg total) by mouth daily. Start Sunday 12/13 Qty: 4 tablet, Refills: 0      CONTINUE these medications which have NOT CHANGED   Details  pantoprazole (PROTONIX) 40 MG tablet Take 1 tablet (40 mg total) by mouth daily at 12 noon. Qty: 30 tablet, Refills: 0      STOP taking these medications     oxyCODONE-acetaminophen (PERCOCET/ROXICET) 5-325 MG per tablet        No Known Allergies Follow-up Information    Follow up with No PCP  Per Patient.   Specialty:  General Practice   Why:  please follow up with PCP in one week.        The results of significant diagnostics from this hospitalization (including imaging, microbiology, ancillary and laboratory) are listed below for reference.    Significant Diagnostic Studies: Dg Chest 2 View  12/06/2014   CLINICAL DATA:  The patient had a fall 2 days ago. Pt slipped and landed on her right wrist. Pt is having pain in her wrist. Pt has also had a productive cough x 1-2 weeks that is worse when lying down. H/o asthma.  EXAM: CHEST  2 VIEW  COMPARISON:  03/25/2013  FINDINGS: The heart is normal in size. There is infiltrate in the right lower lobe consistent with infectious process. Small right pleural effusion is present. The left lung is clear. No pulmonary edema.  IMPRESSION: Right lower lobe infiltrate.   Electronically Signed   By: Rosalie Gums M.D.   On: 12/06/2014 17:10   Dg Wrist Complete Right  12/06/2014   CLINICAL DATA:  Cough.  Pain.  Slipped and fell.  EXAM: RIGHT WRIST - COMPLETE 3+ VIEW  COMPARISON:  None.  FINDINGS: There is mild radiocarpal narrowing without evidence for acute fracture or dislocation. Soft tissues have a normal appearance.  IMPRESSION: 1. Mild degenerative change. 2.  No evidence for acute  abnormality.   Electronically Signed   By: Rosalie Gums M.D.   On: 12/06/2014 17:12    Microbiology: Recent Results (from the past 240 hour(s))  Blood Culture (routine x 2)     Status: None (Preliminary result)   Collection Time: 12/06/14  6:40 PM  Result Value Ref Range Status   Specimen Description BLOOD LEFT ARM  Final   Special Requests BOTTLES DRAWN AEROBIC AND ANAEROBIC 5CC  Final   Culture  Setup Time   Final    12/07/2014 04:08 Performed at [facility]    Culture   Final           BLOOD CULTURE RECEIVED NO GROWTH TO DATE CULTURE WILL BE HELD FOR 5 DAYS BEFORE ISSUING A FINAL NEGATIVE REPORT Performed at [facility]    Report  Status PENDING  Incomplete  Blood Culture (routine x 2)     Status: None (Preliminary result)   Collection Time: 12/06/14  6:45 PM  Result Value Ref Range Status   Specimen Description BLOOD RIGHT ARM  Final   Special Requests BOTTLES DRAWN AEROBIC AND ANAEROBIC 5CC  Final   Culture  Setup Time   Final    12/07/2014 04:08 Performed at [facility]    Culture   Final           BLOOD CULTURE RECEIVED NO GROWTH TO DATE CULTURE WILL BE HELD FOR 5 DAYS BEFORE ISSUING A FINAL NEGATIVE REPORT [facility]    Report Status PENDING  Incomplete     Labs: Basic Metabolic Panel:  Recent Labs Lab 12/06/14 1845 12/07/14 0530 12/08/14 0630 12/09/14 0618  NA 135* 136* 140 141  K 3.7 3.3* 3.4* 3.7  CL 96 104 109 110  CO2 22 18* 19 20  GLUCOSE 126* 89 89 93  BUN 15 10 8 6   CREATININE 0.93 0.63 0.55 0.54  CALCIUM 9.4 8.0* 8.2* 8.3*   Liver Function Tests:  Recent Labs Lab 12/06/14 1845  AST 19  ALT 10  ALKPHOS 83  BILITOT 0.7  PROT 7.6  ALBUMIN 4.0   No results for input(s): LIPASE, AMYLASE in the last 168 hours. No results for input(s): AMMONIA in the last 168 hours. CBC:  Recent Labs Lab 12/06/14 1845 12/07/14 0530 12/08/14 0630  WBC 9.4 6.1 3.5*  NEUTROABS 7.9*  --   --   HGB 11.9* 9.5* 9.7*  HCT 35.5* 28.1* 28.6*  MCV 87.7 89.8 88.3  PLT 214 158 148*   Cardiac Enzymes: No results for input(s): CKTOTAL, CKMB, CKMBINDEX, TROPONINI in the last 168 hours. BNP: BNP (last 3 results) No results for input(s): PROBNP in the last 8760 hours. CBG: No results for input(s): GLUCAP in the last 168 hours.     SignedHartley Barefoot:  Josehua Hammar A  [facility] 12/09/2014, 10:24 AM

## 2014-12-09 NOTE — Progress Notes (Signed)
SATURATION QUALIFICATIONS: (This note is used to comply with regulatory documentation for home oxygen)  Patient Saturations on Room Air at Rest 98%  Patient Saturations on Room Air while Ambulating 92 briefly dropped to 86   Patient Saturations on 1 Liters of oxygen while Ambulating 98  Please briefly explain why patient needs home oxygen: pt 02 sat drops briefly while ambulating, pt is currently using 02 prn at rest and with ambulation.

## 2014-12-13 LAB — CULTURE, BLOOD (ROUTINE X 2)
Culture: NO GROWTH
Culture: NO GROWTH

## 2014-12-16 ENCOUNTER — Ambulatory Visit: Payer: Medicaid Other | Attending: Internal Medicine | Admitting: Internal Medicine

## 2014-12-16 ENCOUNTER — Encounter: Payer: Self-pay | Admitting: Internal Medicine

## 2014-12-16 VITALS — BP 128/93 | HR 76 | Temp 97.5°F | Resp 16 | Ht 62.0 in | Wt 130.0 lb

## 2014-12-16 DIAGNOSIS — J189 Pneumonia, unspecified organism: Secondary | ICD-10-CM

## 2014-12-16 DIAGNOSIS — D509 Iron deficiency anemia, unspecified: Secondary | ICD-10-CM | POA: Insufficient documentation

## 2014-12-16 LAB — COMPLETE METABOLIC PANEL WITH GFR
ALBUMIN: 4.6 g/dL (ref 3.5–5.2)
ALK PHOS: 81 U/L (ref 39–117)
ALT: 22 U/L (ref 0–35)
AST: 23 U/L (ref 0–37)
BILIRUBIN TOTAL: 0.5 mg/dL (ref 0.2–1.2)
BUN: 13 mg/dL (ref 6–23)
CHLORIDE: 102 meq/L (ref 96–112)
CO2: 26 mEq/L (ref 19–32)
CREATININE: 0.73 mg/dL (ref 0.50–1.10)
Calcium: 10 mg/dL (ref 8.4–10.5)
GFR, Est Non African American: >89 mL/min
Glucose, Bld: 85 mg/dL (ref 70–99)
Potassium: 4.5 mEq/L (ref 3.5–5.3)
SODIUM: 139 meq/L (ref 135–145)
TOTAL PROTEIN: 7.3 g/dL (ref 6.0–8.3)

## 2014-12-16 LAB — POCT GLYCOSYLATED HEMOGLOBIN (HGB A1C): HEMOGLOBIN A1C: 5.4

## 2014-12-16 LAB — TSH: TSH: 1.208 u[IU]/mL (ref 0.350–4.500)

## 2014-12-16 MED ORDER — FERROUS SULFATE 325 (65 FE) MG PO TABS: 325.0000 mg | ORAL_TABLET | Freq: Every day | ORAL | Status: DC

## 2014-12-16 NOTE — Progress Notes (Signed)
HFU Pt was diagnosed with asthma and pneumonia. Pt states that she is feeling much better.

## 2014-12-16 NOTE — Patient Instructions (Signed)
Iron Deficiency Anemia Anemia is a condition in which there are less red blood cells or hemoglobin in the blood than normal. Hemoglobin is the part of red blood cells that carries oxygen. Iron deficiency anemia is anemia caused by too little iron. It is the most common type of anemia. It may leave you tired and short of breath. CAUSES   Lack of iron in the diet.  Poor absorption of iron, as seen with intestinal disorders.  Intestinal bleeding.  Heavy periods. SIGNS AND SYMPTOMS  Mild anemia may not be noticeable. Symptoms may include:  Fatigue.  Headache.  Pale skin.  Weakness.  Tiredness.  Shortness of breath.  Dizziness.  Cold hands and feet.  Fast or irregular heartbeat. DIAGNOSIS  Diagnosis requires a thorough evaluation and physical exam by your health care provider. Blood tests are generally used to confirm iron deficiency anemia. Additional tests may be done to find the underlying cause of your anemia. These may include:  Testing for blood in the stool (fecal occult blood test).  A procedure to see inside the colon and rectum (colonoscopy).  A procedure to see inside the esophagus and stomach (endoscopy). TREATMENT  Iron deficiency anemia is treated by correcting the cause of the deficiency. Treatment may involve:  Adding iron-rich foods to your diet.  Taking iron supplements. Pregnant or breastfeeding women need to take extra iron because their normal diet usually does not provide the required amount.  Taking vitamins. Vitamin C improves the absorption of iron. Your health care provider may recommend that you take your iron tablets with a glass of orange juice or vitamin C supplement.  Medicines to make heavy menstrual flow lighter.  Surgery. HOME CARE INSTRUCTIONS   Take iron as directed by your health care provider.  If you cannot tolerate taking iron supplements by mouth, talk to your health care provider about taking them through a vein  (intravenously) or an injection into a muscle.  For the best iron absorption, iron supplements should be taken on an empty stomach. If you cannot tolerate them on an empty stomach, you may need to take them with food.  Do not drink milk or take antacids at the same time as your iron supplements. Milk and antacids may interfere with the absorption of iron.  Iron supplements can cause constipation. Make sure to include fiber in your diet to prevent constipation. A stool softener may also be recommended.  Take vitamins as directed by your health care provider.  Eat a diet rich in iron. Foods high in iron include liver, lean beef, whole-grain bread, eggs, dried fruit, and dark green leafy vegetables. SEEK IMMEDIATE MEDICAL CARE IF:   You faint. If this happens, do not drive. Call your local emergency services (911 in [place] if no other help is available.  You have chest pain.  You feel nauseous or vomit.  You have severe or increased shortness of breath with activity.  You feel weak.  You have a rapid heartbeat.  You have unexplained sweating.  You become light-headed when getting up from a chair or bed. MAKE SURE YOU:   Understand these instructions.  Will watch your condition.  Will get help right away if you are not doing well or get worse. Document Released: 12/09/2000 Document Revised: 12/17/2013 Document Reviewed: 08/19/2013 [facility] Patient Information 2015 [place], [place]. This information is not intended to replace advice given to you by your health care provider. Make sure you discuss any questions you have with your health care provider.  Pneumonia Pneumonia is an infection of the lungs.  CAUSES Pneumonia may be caused by bacteria or a virus. Usually, these infections are caused by breathing infectious particles into the lungs (respiratory tract). SIGNS AND SYMPTOMS   Cough.  Fever.  Chest pain.  Increased rate of breathing.  Wheezing.  Mucus  production. DIAGNOSIS  If you have the common symptoms of pneumonia, your health care provider will typically confirm the diagnosis with a chest X-ray. The X-ray will show an abnormality in the lung (pulmonary infiltrate) if you have pneumonia. Other tests of your blood, urine, or sputum may be done to find the specific cause of your pneumonia. Your health care provider may also do tests (blood gases or pulse oximetry) to see how well your lungs are working. TREATMENT  Some forms of pneumonia may be spread to other people when you cough or sneeze. You may be asked to wear a mask before and during your exam. Pneumonia that is caused by bacteria is treated with antibiotic medicine. Pneumonia that is caused by the influenza virus may be treated with an antiviral medicine. Most other viral infections must run their course. These infections will not respond to antibiotics.  HOME CARE INSTRUCTIONS   Cough suppressants may be used if you are losing too much rest. However, coughing protects you by clearing your lungs. You should avoid using cough suppressants if you can.  Your health care provider may have prescribed medicine if he or she thinks your pneumonia is caused by bacteria or influenza. Finish your medicine even if you start to feel better.  Your health care provider may also prescribe an expectorant. This loosens the mucus to be coughed up.  Take medicines only as directed by your health care provider.  Do not smoke. Smoking is a common cause of bronchitis and can contribute to pneumonia. If you are a smoker and continue to smoke, your cough may last several weeks after your pneumonia has cleared.  A cold steam vaporizer or humidifier in your room or home may help loosen mucus.  Coughing is often worse at night. Sleeping in a semi-upright position in a recliner or using a couple pillows under your head will help with this.  Get rest as you feel it is needed. Your body will usually let you know  when you need to rest. PREVENTION A pneumococcal shot (vaccine) is available to prevent a common bacterial cause of pneumonia. This is usually suggested for:  People over 56 years old.  Patients on chemotherapy.  People with chronic lung problems, such as bronchitis or emphysema.  People with immune system problems. If you are over 65 or have a high risk condition, you may receive the pneumococcal vaccine if you have not received it before. In some countries, a routine influenza vaccine is also recommended. This vaccine can help prevent some cases of pneumonia.You may be offered the influenza vaccine as part of your care. If you smoke, it is time to quit. You may receive instructions on how to stop smoking. Your health care provider can provide medicines and counseling to help you quit. SEEK MEDICAL CARE IF: You have a fever. SEEK IMMEDIATE MEDICAL CARE IF:   Your illness becomes worse. This is especially true if you are elderly or weakened from any other disease.  You cannot control your cough with suppressants and are losing sleep.  You begin coughing up blood.  You develop pain which is getting worse or is uncontrolled with medicines.  Any of the  symptoms which initially brought you in for treatment are getting worse rather than better.  You develop shortness of breath or chest pain. MAKE SURE YOU:   Understand these instructions.  Will watch your condition.  Will get help right away if you are not doing well or get worse. Document Released: 12/12/2005 Document Revised: 04/28/2014 Document Reviewed: 03/03/2011 [facility] Patient Information 2015 [place]. This information is not intended to replace advice given to you by your health care provider. Make sure you discuss any questions you have with your health care provider.

## 2014-12-16 NOTE — Progress Notes (Signed)
Patient ID: [name] FootmanRobin L [name], female   DOB: [birth year], 58 y.o.   MRN: [id number]   [name] SongsterRobin [name], is a 58 y.o. female  WUJ:[id number]:[id number]  [id number]  DOB - [birth year]  CC:  Chief Complaint  Patient presents with  . Establish Care  . Hospitalization Follow-up       HPI: [name] [name] is a 58 y.o. female here today to establish medical care. Patient was recently admitted to the hospital and treated for community acquired pneumonia , here today for hospital follow-up. Patient is doing very well. Patient was started on IV ceftriaxone and Zithromax , discharged on Levaquin for 5 days , She completed antibiotics. She has no new complaints today. Patient has never smoked cigarettes.  She does not drink alcohol , and she does not use illicit drugs. Patient medical history include asthma and GERD. Patient has No headache, No chest pain, No abdominal pain - No Nausea, No new weakness tingling or numbness, No Cough - SOB.  No Known Allergies Past Medical History  Diagnosis Date  . Acute bronchitis   . Esophageal reflux   . Anemia, unspecified   . Mild intellectual disabilities   . Pneumonia, organism unspecified   . Other acute reactions to stress   . Unspecified asthma(493.90)    Current Outpatient Prescriptions on File Prior to Visit  Medication Sig Dispense Refill  . albuterol (PROVENTIL HFA;VENTOLIN HFA) 108 (90 BASE) MCG/ACT inhaler Inhale 2 puffs into the lungs every 4 (four) hours as needed for wheezing or shortness of breath. 1 Inhaler 1  . benzonatate (TESSALON) 200 MG capsule Take 1 capsule (200 mg total) by mouth 3 (three) times daily as needed for cough. 20 capsule 0  . guaiFENesin (MUCINEX) 600 MG 12 hr tablet Take 1 tablet (600 mg total) by mouth 2 (two) times daily. 30 tablet 0  . levofloxacin (LEVAQUIN) 750 MG tablet Take 1 tablet (750 mg total) by mouth daily. Start Sunday 12/13 4 tablet 0  . pantoprazole (PROTONIX) 40 MG tablet Take 1 tablet (40 mg total) by mouth daily at 12  noon. 30 tablet 0   No current facility-administered medications on file prior to visit.   Family History  Problem Relation Age of Onset  . Colon cancer Father   . Asthma Sister    History   Social History  . Marital Status: Single    Spouse Name: N/A    Number of Children: N/A  . Years of Education: N/A   Occupational History  . Not on file.   Social History Main Topics  . Smoking status: Never Smoker   . Smokeless tobacco: Never Used  . Alcohol Use: No  . Drug Use: No  . Sexual Activity: Not on file   Other Topics Concern  . Not on file   Social History Narrative    Review of Systems: Constitutional: Negative for fever, chills, diaphoresis, activity change, appetite change and fatigue. HENT: Negative for ear pain, nosebleeds, congestion, facial swelling, rhinorrhea, neck pain, neck stiffness and ear discharge.  Eyes: Negative for pain, discharge, redness, itching and visual disturbance. Respiratory: Negative for cough, choking, chest tightness, shortness of breath, wheezing and stridor.  Cardiovascular: Negative for chest pain, palpitations and leg swelling. Gastrointestinal: Negative for abdominal distention. Genitourinary: Negative for dysuria, urgency, frequency, hematuria, flank pain, decreased urine volume, difficulty urinating and dyspareunia.  Musculoskeletal: Negative for back pain, joint swelling, arthralgia and gait problem. Neurological: Negative for dizziness, tremors, seizures, syncope, facial asymmetry, speech difficulty, weakness, light-headedness, numbness and  headaches.  Hematological: Negative for adenopathy. Does not bruise/bleed easily. Psychiatric/Behavioral: Negative for hallucinations, behavioral problems, confusion, dysphoric mood, decreased concentration and agitation.    Objective:   Filed Vitals:   12/16/14 1021  BP: 128/93  Pulse: 76  Temp: 97.5 F (36.4 C)  Resp: 16    Physical Exam: Constitutional: Patient appears  well-developed and well-nourished. No distress. HENT: Normocephalic, atraumatic, External right and left ear normal. Oropharynx is clear and moist.  Eyes: Conjunctivae and EOM are normal. PERRLA, no scleral icterus. Neck: Normal ROM. Neck supple. No JVD. No tracheal deviation. No thyromegaly. CVS: RRR, S1/S2 +, no murmurs, no gallops, no carotid bruit.  Pulmonary: Effort and breath sounds normal, no stridor, rhonchi, wheezes, rales.  Abdominal: Soft. BS +, no distension, tenderness, rebound or guarding.  Musculoskeletal: Normal range of motion. No edema and no tenderness.  Lymphadenopathy: No lymphadenopathy noted, cervical, inguinal or axillary Neuro: Alert. Normal reflexes, muscle tone coordination. No cranial nerve deficit. Skin: Skin is warm and dry. No rash noted. Not diaphoretic. No erythema. No pallor. Psychiatric: Normal mood and affect. Behavior, judgment, thought content normal.  Lab Results  Component Value Date   WBC 3.5* 12/08/2014   HGB 9.7* 12/08/2014   HCT 28.6* 12/08/2014   MCV 88.3 12/08/2014   PLT 148* 12/08/2014   Lab Results  Component Value Date   CREATININE 0.54 12/09/2014   BUN 6 12/09/2014   NA 141 12/09/2014   K 3.7 12/09/2014   CL 110 12/09/2014   CO2 20 12/09/2014    No results found for: HGBA1C Lipid Panel  No results found for: CHOL, TRIG, HDL, CHOLHDL, VLDL, LDLCALC     Assessment and plan:   1. Iron deficiency anemia  - ferrous sulfate 325 (65 FE) MG tablet; Take 1 tablet (325 mg total) by mouth daily with breakfast.  Dispense: 90 tablet; Refill: 3  2. CAP (community acquired pneumonia)  - POCT glycosylated hemoglobin (Hb A1C) - TSH - Vit D  25 hydroxy (rtn osteoporosis monitoring) - COMPLETE METABOLIC PANEL WITH GFR  Patient was counseled extensively about nutrition and exercise.   Return in about 3 months (around 03/17/2015), or if symptoms worsen or fail to improve, for COPD, Anemia, , Annual Physical.  The patient was given clear  instructions to go to ER or return to medical center if symptoms don't improve, worsen or new problems develop. The patient verbalized understanding. The patient was told to call to get lab results if they haven't heard anything in the next week.     This note has been created with Education officer, environmentalDragon speech recognition software and smart phrase technology. Any transcriptional errors are unintentional.    Jeanann LewandowskyJEGEDE, Lynne Takemoto, MD, MHA, FACP, FAAP [facility], [place] [phone]   12/16/2014, 11:27 AM

## 2014-12-17 LAB — VITAMIN D 25 HYDROXY (VIT D DEFICIENCY, FRACTURES): Vit D, 25-Hydroxy: 31 ng/mL (ref 30–100)

## 2014-12-29 DIAGNOSIS — K219 Gastro-esophageal reflux disease without esophagitis: Secondary | ICD-10-CM | POA: Diagnosis not present

## 2014-12-29 DIAGNOSIS — J45909 Unspecified asthma, uncomplicated: Secondary | ICD-10-CM | POA: Diagnosis not present

## 2014-12-29 DIAGNOSIS — J189 Pneumonia, unspecified organism: Secondary | ICD-10-CM | POA: Diagnosis not present

## 2015-01-02 ENCOUNTER — Telehealth: Payer: Self-pay | Admitting: Emergency Medicine

## 2015-01-02 NOTE — Telephone Encounter (Signed)
Attempted to reach pt with normal lab results No answer

## 2015-01-02 NOTE — Telephone Encounter (Signed)
-----   Message from Quentin Angstlugbemiga E Jegede, MD sent at 12/21/2014  1:28 PM EST ----- Please inform patient that all her laboratory test results are within normal limits.

## 2015-02-05 DIAGNOSIS — K219 Gastro-esophageal reflux disease without esophagitis: Secondary | ICD-10-CM | POA: Diagnosis not present

## 2015-02-05 DIAGNOSIS — Z0000 Encounter for general adult medical examination without abnormal findings: Secondary | ICD-10-CM | POA: Diagnosis not present

## 2015-02-05 DIAGNOSIS — J45909 Unspecified asthma, uncomplicated: Secondary | ICD-10-CM | POA: Diagnosis not present

## 2015-02-05 DIAGNOSIS — E782 Mixed hyperlipidemia: Secondary | ICD-10-CM | POA: Diagnosis not present

## 2015-02-05 DIAGNOSIS — R7309 Other abnormal glucose: Secondary | ICD-10-CM | POA: Diagnosis not present

## 2015-02-05 DIAGNOSIS — R5383 Other fatigue: Secondary | ICD-10-CM | POA: Diagnosis not present

## 2015-03-16 DIAGNOSIS — E782 Mixed hyperlipidemia: Secondary | ICD-10-CM | POA: Diagnosis not present

## 2015-10-14 ENCOUNTER — Other Ambulatory Visit (INDEPENDENT_AMBULATORY_CARE_PROVIDER_SITE_OTHER): Payer: Commercial Managed Care - HMO

## 2015-10-14 ENCOUNTER — Ambulatory Visit (INDEPENDENT_AMBULATORY_CARE_PROVIDER_SITE_OTHER): Payer: Commercial Managed Care - HMO | Admitting: Internal Medicine

## 2015-10-14 ENCOUNTER — Encounter: Payer: Self-pay | Admitting: Internal Medicine

## 2015-10-14 VITALS — BP 112/72 | HR 80 | Temp 98.3°F | Resp 14 | Ht 62.0 in | Wt 124.1 lb

## 2015-10-14 DIAGNOSIS — J4520 Mild intermittent asthma, uncomplicated: Secondary | ICD-10-CM

## 2015-10-14 DIAGNOSIS — Z0000 Encounter for general adult medical examination without abnormal findings: Secondary | ICD-10-CM | POA: Diagnosis not present

## 2015-10-14 DIAGNOSIS — Z23 Encounter for immunization: Secondary | ICD-10-CM

## 2015-10-14 DIAGNOSIS — J45909 Unspecified asthma, uncomplicated: Secondary | ICD-10-CM | POA: Diagnosis not present

## 2015-10-14 LAB — COMPREHENSIVE METABOLIC PANEL WITH GFR
ALT: 9 U/L (ref 0–35)
AST: 19 U/L (ref 0–37)
Albumin: 4.3 g/dL (ref 3.5–5.2)
Alkaline Phosphatase: 73 U/L (ref 39–117)
BUN: 19 mg/dL (ref 6–23)
CO2: 28 meq/L (ref 19–32)
Calcium: 9.6 mg/dL (ref 8.4–10.5)
Chloride: 105 meq/L (ref 96–112)
Creatinine, Ser: 0.75 mg/dL (ref 0.40–1.20)
GFR: 84.48 mL/min
Glucose, Bld: 92 mg/dL (ref 70–99)
Potassium: 3.6 meq/L (ref 3.5–5.1)
Sodium: 141 meq/L (ref 135–145)
Total Bilirubin: 0.7 mg/dL (ref 0.2–1.2)
Total Protein: 7 g/dL (ref 6.0–8.3)

## 2015-10-14 LAB — LIPID PANEL
CHOLESTEROL: 248 mg/dL — AB (ref 0–200)
HDL: 60.5 mg/dL (ref >39.00)
LDL Cholesterol: 176 mg/dL — ABNORMAL HIGH (ref 0–99)
NonHDL: 187.63
Total CHOL/HDL Ratio: 4
Triglycerides: 60 mg/dL (ref 0.0–149.0)
VLDL: 12 mg/dL (ref 0.0–40.0)

## 2015-10-14 MED ORDER — ALBUTEROL SULFATE 108 (90 BASE) MCG/ACT IN AEPB: 2.0000 | INHALATION_SPRAY | Freq: Four times a day (QID) | RESPIRATORY_TRACT | Status: DC | PRN

## 2015-10-14 MED ORDER — BUDESONIDE-FORMOTEROL FUMARATE 160-4.5 MCG/ACT IN AERO: 2.0000 | INHALATION_SPRAY | Freq: Two times a day (BID) | RESPIRATORY_TRACT | Status: DC

## 2015-10-14 NOTE — Progress Notes (Signed)
   Subjective:    Patient ID: [name] [name], female    DOB: December 13, 1956, 59 y.o.   MRN: [id number]  HPI The patient is a 59 YO female coming in new for her asthma. She has had it for many years. She normally takes symbicort everyday to help her breathing. She also generally has an albuterol inhaler that helps when needed. She is currently out of that. Her breathing is doing okay overall but she has been in the hospital several times in the last year. Never been intubated with her breathing. Denies current SOB or wheezing.   [facility] reviewed and updated.   Review of Systems  Constitutional: Negative for fever, activity change, appetite change, fatigue and unexpected weight change.  HENT: Negative.   Eyes: Negative.   Respiratory: Positive for shortness of breath. Negative for cough, chest tightness and wheezing.        Rare  Cardiovascular: Negative for chest pain, palpitations and leg swelling.  Gastrointestinal: Negative for nausea, abdominal pain, diarrhea, constipation, blood in stool and abdominal distention.  Musculoskeletal: Negative.   Skin: Negative.   Neurological: Negative.   Psychiatric/Behavioral: Negative.       Objective:   Physical Exam  Constitutional: She appears well-developed and well-nourished.  HENT:  Head: Normocephalic and atraumatic.  Eyes: EOM are normal.  Neck: Normal range of motion.  Cardiovascular: Normal rate and regular rhythm.   Pulmonary/Chest: Effort normal and breath sounds normal. No respiratory distress. She has no wheezes. She has no rales.  Abdominal: Soft. Bowel sounds are normal. She exhibits no distension. There is no tenderness. There is no rebound.  Skin: Skin is warm and dry.  Psychiatric: She has a normal mood and affect.   Filed Vitals:   10/14/15 0910  BP: 112/72  Pulse: 80  Temp: 98.3 F (36.8 C)  TempSrc: Oral  Resp: 14  Height: 5\' 2"  (1.575 m)  Weight: 124 lb 1.9 oz (56.3 kg)  SpO2: 96%        Assessment & Plan:  Flu shot given at visit.

## 2015-10-14 NOTE — Progress Notes (Signed)
Pre visit review using our clinic review tool, if applicable. No additional management support is needed unless otherwise documented below in the visit note. 

## 2015-10-14 NOTE — Assessment & Plan Note (Signed)
Rx for symbicort today (which she states has done well to keep her controlled and proair respiclick (and coupon so free). No indication for antibiotics or steroids as no flare currently. Return in about 6 months or sooner with any breathing problems.

## 2015-10-14 NOTE — Patient Instructions (Signed)
We have given you the flu shot and have checked the blood today.   We have sent in the symbicort to the [facility] for you and given you the prescription to take to [facility] for the free albuterol inhaler.   Come back in about 6-12 months. If you have any breathing problems call us sooner and come back sooner.   Back Exercises If you have pain in your back, do these exercises 2-3 times each day or as told by your doctor. When the pain goes away, do the exercises once each day, but repeat the steps more times for each exercise (do more repetitions). If you do not have pain in your back, do these exercises once each day or as told by your doctor. EXERCISES Single Knee to Chest Do these steps 3-5 times in a row for each leg: 1. Lie on your back on a firm bed or the floor with your legs stretched out. 2. Bring one knee to your chest. 3. Hold your knee to your chest by grabbing your knee or thigh. 4. Pull on your knee until you feel a gentle stretch in your lower back. 5. Keep doing the stretch for 10-30 seconds. 6. Slowly let go of your leg and straighten it. Pelvic Tilt Do these steps 5-10 times in a row: 1. Lie on your back on a firm bed or the floor with your legs stretched out. 2. Bend your knees so they point up to the ceiling. Your feet should be flat on the floor. 3. Tighten your lower belly (abdomen) muscles to press your lower back against the floor. This will make your tailbone point up to the ceiling instead of pointing down to your feet or the floor. 4. Stay in this position for 5-10 seconds while you gently tighten your muscles and breathe evenly. Cat-Cow Do these steps until your lower back bends more easily: 1. Get on your hands and knees on a firm surface. Keep your hands under your shoulders, and keep your knees under your hips. You may put padding under your knees. 2. Let your head hang down, and make your tailbone point down to the floor so your lower back is round like the  back of a cat. 3. Stay in this position for 5 seconds. 4. Slowly lift your head and make your tailbone point up to the ceiling so your back hangs low (sags) like the back of a cow. 5. Stay in this position for 5 seconds. Press-Ups Do these steps 5-10 times in a row: 1. Lie on your belly (face-down) on the floor. 2. Place your hands near your head, about shoulder-width apart. 3. While you keep your back relaxed and keep your hips on the floor, slowly straighten your arms to raise the top half of your body and lift your shoulders. Do not use your back muscles. To make yourself more comfortable, you may change where you place your hands. 4. Stay in this position for 5 seconds. 5. Slowly return to lying flat on the floor. Bridges Do these steps 10 times in a row: 1. Lie on your back on a firm surface. 2. Bend your knees so they point up to the ceiling. Your feet should be flat on the floor. 3. Tighten your butt muscles and lift your butt off of the floor until your waist is almost as high as your knees. If you do not feel the muscles working in your butt and the back of your thighs, slide your  feet 1-2 inches farther away from your butt. 4. Stay in this position for 3-5 seconds. 5. Slowly lower your butt to the floor, and let your butt muscles relax. If this exercise is too easy, try doing it with your arms crossed over your chest. Belly Crunches Do these steps 5-10 times in a row: 1. Lie on your back on a firm bed or the floor with your legs stretched out. 2. Bend your knees so they point up to the ceiling. Your feet should be flat on the floor. 3. Cross your arms over your chest. 4. Tip your chin a little bit toward your chest but do not bend your neck. 5. Tighten your belly muscles and slowly raise your chest just enough to lift your shoulder blades a tiny bit off of the floor. 6. Slowly lower your chest and your head to the floor. Back Lifts Do these steps 5-10 times in a row: 1. Lie on  your belly (face-down) with your arms at your sides, and rest your forehead on the floor. 2. Tighten the muscles in your legs and your butt. 3. Slowly lift your chest off of the floor while you keep your hips on the floor. Keep the back of your head in line with the curve in your back. Look at the floor while you do this. 4. Stay in this position for 3-5 seconds. 5. Slowly lower your chest and your face to the floor. GET HELP IF:  Your back pain gets a lot worse when you do an exercise.  Your back pain does not lessen 2 hours after you exercise. If you have any of these problems, stop doing the exercises. Do not do them again unless your doctor says it is okay. GET HELP RIGHT AWAY IF:  You have sudden, very bad back pain. If this happens, stop doing the exercises. Do not do them again unless your doctor says it is okay.   This information is not intended to replace advice given to you by your health care provider. Make sure you discuss any questions you have with your health care provider.   Document Released: 01/14/2011 Document Revised: 09/02/2015 Document Reviewed: 02/05/2015 Elsevier Interactive Patient Education [employer]

## 2015-10-22 ENCOUNTER — Other Ambulatory Visit: Payer: Self-pay | Admitting: Internal Medicine

## 2015-10-22 MED ORDER — SIMVASTATIN 20 MG PO TABS: 20.0000 mg | ORAL_TABLET | Freq: Every day | ORAL | Status: DC

## 2016-03-20 ENCOUNTER — Emergency Department (HOSPITAL_COMMUNITY)
Admission: EM | Admit: 2016-03-20 | Discharge: 2016-03-21 | Disposition: A | Discharge status: Home / Self Care | Payer: Commercial Managed Care - HMO | Attending: Emergency Medicine | Admitting: Emergency Medicine

## 2016-03-20 ENCOUNTER — Encounter (HOSPITAL_COMMUNITY): Payer: Self-pay

## 2016-03-20 ENCOUNTER — Emergency Department (HOSPITAL_COMMUNITY): Payer: Commercial Managed Care - HMO

## 2016-03-20 DIAGNOSIS — Z8659 Personal history of other mental and behavioral disorders: Secondary | ICD-10-CM | POA: Diagnosis not present

## 2016-03-20 DIAGNOSIS — Z8719 Personal history of other diseases of the digestive system: Secondary | ICD-10-CM | POA: Insufficient documentation

## 2016-03-20 DIAGNOSIS — R1013 Epigastric pain: Secondary | ICD-10-CM | POA: Insufficient documentation

## 2016-03-20 DIAGNOSIS — Z7951 Long term (current) use of inhaled steroids: Secondary | ICD-10-CM | POA: Insufficient documentation

## 2016-03-20 DIAGNOSIS — J45901 Unspecified asthma with (acute) exacerbation: Secondary | ICD-10-CM | POA: Insufficient documentation

## 2016-03-20 DIAGNOSIS — Z79899 Other long term (current) drug therapy: Secondary | ICD-10-CM | POA: Insufficient documentation

## 2016-03-20 DIAGNOSIS — J189 Pneumonia, unspecified organism: Secondary | ICD-10-CM | POA: Diagnosis not present

## 2016-03-20 DIAGNOSIS — Z862 Personal history of diseases of the blood and blood-forming organs and certain disorders involving the immune mechanism: Secondary | ICD-10-CM | POA: Diagnosis not present

## 2016-03-20 DIAGNOSIS — R069 Unspecified abnormalities of breathing: Secondary | ICD-10-CM | POA: Diagnosis not present

## 2016-03-20 DIAGNOSIS — R0602 Shortness of breath: Secondary | ICD-10-CM | POA: Diagnosis not present

## 2016-03-20 DIAGNOSIS — J159 Unspecified bacterial pneumonia: Secondary | ICD-10-CM | POA: Diagnosis not present

## 2016-03-20 LAB — CBC WITH DIFFERENTIAL/PLATELET
Basophils Absolute: 0 10*3/uL (ref 0.0–0.1)
Basophils Relative: 0 %
EOS ABS: 0 10*3/uL (ref 0.0–0.7)
EOS PCT: 0 %
HCT: 35.5 % — ABNORMAL LOW (ref 36.0–46.0)
Hemoglobin: 11.6 g/dL — ABNORMAL LOW (ref 12.0–15.0)
LYMPHS ABS: 1.2 10*3/uL (ref 0.7–4.0)
LYMPHS PCT: 13 %
MCH: 29.4 pg (ref 26.0–34.0)
MCHC: 32.7 g/dL (ref 30.0–36.0)
MCV: 90.1 fL (ref 78.0–100.0)
MONO ABS: 0.6 10*3/uL (ref 0.1–1.0)
MONOS PCT: 6 %
Neutro Abs: 7.5 10*3/uL (ref 1.7–7.7)
Neutrophils Relative %: 81 %
PLATELETS: 165 10*3/uL (ref 150–400)
RBC: 3.94 MIL/uL (ref 3.87–5.11)
RDW: 12.8 % (ref 11.5–15.5)
WBC: 9.3 10*3/uL (ref 4.0–10.5)

## 2016-03-20 LAB — COMPREHENSIVE METABOLIC PANEL
ALT: 14 U/L (ref 14–54)
ANION GAP: 12 (ref 5–15)
AST: 24 U/L (ref 15–41)
Albumin: 3.9 g/dL (ref 3.5–5.0)
Alkaline Phosphatase: 60 U/L (ref 38–126)
BILIRUBIN TOTAL: 1.3 mg/dL — AB (ref 0.3–1.2)
BUN: 9 mg/dL (ref 6–20)
CALCIUM: 8.9 mg/dL (ref 8.9–10.3)
CHLORIDE: 106 mmol/L (ref 101–111)
CO2: 19 mmol/L — AB (ref 22–32)
CREATININE: 0.87 mg/dL (ref 0.44–1.00)
Glucose, Bld: 123 mg/dL — ABNORMAL HIGH (ref 65–99)
POTASSIUM: 3.2 mmol/L — AB (ref 3.5–5.1)
Sodium: 137 mmol/L (ref 135–145)
Total Protein: 6.8 g/dL (ref 6.5–8.1)

## 2016-03-20 LAB — TROPONIN I

## 2016-03-20 LAB — LIPASE, BLOOD: Lipase: 20 U/L (ref 11–51)

## 2016-03-20 MED ORDER — ALBUTEROL SULFATE HFA 108 (90 BASE) MCG/ACT IN AERS: 2.0000 | INHALATION_SPRAY | RESPIRATORY_TRACT | Status: DC | PRN

## 2016-03-20 MED ORDER — AZITHROMYCIN 250 MG PO TABS: 500.0000 mg | ORAL_TABLET | Freq: Every day | ORAL | Status: AC

## 2016-03-20 MED ORDER — ONDANSETRON HCL 4 MG/2ML IJ SOLN
4.0000 mg | Freq: Once | INTRAMUSCULAR | Status: AC
Start: 2016-03-20 — End: 2016-03-20
  Administered 2016-03-20: 4 mg via INTRAVENOUS
  Filled 2016-03-20: qty 2

## 2016-03-20 MED ORDER — SODIUM CHLORIDE 0.9 % IV BOLUS (SEPSIS)
1000.0000 mL | Freq: Once | INTRAVENOUS | Status: AC
  Administered 2016-03-20: 1000 mL via INTRAVENOUS

## 2016-03-20 MED ORDER — METHYLPREDNISOLONE SODIUM SUCC 125 MG IJ SOLR
125.0000 mg | INTRAMUSCULAR | Status: AC
  Administered 2016-03-20: 125 mg via INTRAVENOUS
  Filled 2016-03-20: qty 2

## 2016-03-20 MED ORDER — PREDNISONE 20 MG PO TABS: 40.0000 mg | ORAL_TABLET | Freq: Every day | ORAL | Status: DC

## 2016-03-20 MED ORDER — DEXTROSE 5 % IV SOLN
500.0000 mg | Freq: Once | INTRAVENOUS | Status: AC
  Administered 2016-03-20: 500 mg via INTRAVENOUS
  Filled 2016-03-20: qty 500

## 2016-03-20 MED ORDER — GI COCKTAIL ~~LOC~~
30.0000 mL | Freq: Once | ORAL | Status: AC
  Administered 2016-03-20: 30 mL via ORAL
  Filled 2016-03-20: qty 30

## 2016-03-20 NOTE — ED Notes (Signed)
Pt stable, ambulatory, states understanding of discharge instructions 

## 2016-03-20 NOTE — Discharge Instructions (Signed)
As discussed, with today's diagnosis of pneumonia is very important that he follow up with her primary care physician. For the next 2 days, please use albuterol treatments every 4 hours in addition to the prescribed antibiotics, steroids.  Return here for concerning changes in your condition.

## 2016-03-20 NOTE — ED Provider Notes (Signed)
CSN: [id number]     Arrival date & time 03/20/16  1817 History   First MD Initiated Contact with Patient 03/20/16 1807     Chief Complaint  Patient presents with  . Shortness of Breath     (Consider location/radiation/quality/duration/timing/severity/associated sxs/prior Treatment) HPI Patient presents with concern of chest pain. Pain began several days ago, has become worse over the past day. Pain focally in the sternal area, epigastrium. Pain is nonradiating, with associated dyspnea. No nausea, vomiting, fever, chills. Patient has not taken any medication for relief. No clear alleviating or exacerbating factors.  Past Medical History  Diagnosis Date  . Acute bronchitis   . Esophageal reflux   . Anemia, unspecified   . Mild intellectual disabilities   . Pneumonia, organism unspecified   . Other acute reactions to stress   . Unspecified asthma(493.90)    Past Surgical History  Procedure Laterality Date  . Tonsillectomy     Family History  Problem Relation Age of Onset  . Colon cancer Father   . Asthma Sister    Social History  Substance Use Topics  . Smoking status: Never Smoker   . Smokeless tobacco: Never Used  . Alcohol Use: No   OB History    No data available     Review of Systems  Constitutional:       Per HPI, otherwise negative  HENT:       Per HPI, otherwise negative  Respiratory:       Per HPI, otherwise negative  Cardiovascular:       Per HPI, otherwise negative  Gastrointestinal: Negative for vomiting.  Endocrine:       Negative aside from HPI  Genitourinary:       Neg aside from HPI   Musculoskeletal:       Per HPI, otherwise negative  Skin: Negative.   Neurological: Negative for syncope.      Allergies  Review of patient's allergies indicates no known allergies.  Home Medications   Prior to Admission medications   Medication Sig Start Date End Date Taking? Authorizing Provider  albuterol (PROVENTIL HFA;VENTOLIN HFA) 108 (90  BASE) MCG/ACT inhaler Inhale 2 puffs into the lungs every 4 (four) hours as needed for wheezing or shortness of breath. Patient not taking: Reported on 10/14/2015 12/09/14   [name] A Regalado, MD  Albuterol Sulfate (PROAIR RESPICLICK) 108 (90 BASE) MCG/ACT AEPB Inhale 2 puffs into the lungs 4 (four) times daily as needed. 10/14/15   [name] Broker, MD  budesonide-formoterol [facility]) 160-4.5 MCG/ACT inhaler Inhale 2 puffs into the lungs 2 (two) times daily. 10/14/15   [name] Broker, MD  simvastatin (ZOCOR) 20 MG tablet Take 1 tablet (20 mg total) by mouth at bedtime. 10/22/15   [name] Broker, MD   BP 110/75 mmHg  Pulse 103  Resp 25  SpO2 97% Physical Exam  Constitutional: She is oriented to person, place, and time. She appears well-developed and well-nourished. No distress.  HENT:  Head: Normocephalic and atraumatic.  Eyes: Conjunctivae and EOM are normal.  Cardiovascular: Normal rate and regular rhythm.   Pulmonary/Chest: Effort normal and breath sounds normal. No stridor. No respiratory distress. She has no wheezes.  Abdominal: She exhibits no distension.  Mild tenderness to palpation with pressure applied to the sternum  Musculoskeletal: She exhibits no edema.  Neurological: She is alert and oriented to person, place, and time. No cranial nerve deficit.  Skin: Skin is warm and dry.  Psychiatric: She has a normal mood  and affect.  Nursing note and vitals reviewed.   ED Course  Procedures (including critical care time) Labs Review Labs Reviewed  COMPREHENSIVE METABOLIC PANEL - Abnormal; Notable for the following:    Potassium 3.2 (*)    CO2 19 (*)    Glucose, Bld 123 (*)    Total Bilirubin 1.3 (*)    All other components within normal limits  CBC WITH DIFFERENTIAL/PLATELET - Abnormal; Notable for the following:    Hemoglobin 11.6 (*)    HCT 35.5 (*)    All other components within normal limits  LIPASE, BLOOD  TROPONIN I    Imaging Review Dg Chest 2  View  03/20/2016  CLINICAL DATA:  Short of breath for 3 days. EXAM: CHEST  2 VIEW COMPARISON:  12/06/2014 FINDINGS: There streaky opacity in the posterior medial left lower lobe, new since the prior study. Although this could reflect atelectasis, pneumonia is suspected. Remainder of the lungs is clear. No pleural effusion or pneumothorax. Heart, mediastinum and hila are unremarkable. Bony thorax is intact. IMPRESSION: Left lower lobe opacity consistent with pneumonia. Electronically Signed   By: Amie Portlandavid  [name] M.D.   On: 03/20/2016 19:10   I have personally reviewed and evaluated these images and lab results as part of my medical decision-making.   EKG Interpretation   Date/Time:  Sunday March 20 2016 20:28:07 EDT Ventricular Rate:  97 PR Interval:  191 QRS Duration: 93 QT Interval:  302 QTC Calculation: 383 R Axis:   66 Text Interpretation:  Sinus rhythm Nonspecific T abnormalities, inferior  leads Sinus rhythm T wave abnormality No significant change since last  tracing Abnormal ekg Confirmed by [name] Fogal  MD ([id number]) on 03/20/2016  8:54:30 PM     10 :25 PM Sleeping, in no distress, no evidence for respiratory compromise. She is intimately tachycardic, but has been receiving albuterol. She remains in stable condition.  MDM  This patient with history of asthma presents with new chest pain. Here, the patient is awake and alert, afebrile. Patient however, is found to have pneumonia. Patient received albuterol sessions, steroids, antibiotics, with improvement in her condition.  Patient discharged in stable condition.  [name] Munchobert Kuron Docken, MD 03/20/16 2226

## 2016-03-20 NOTE — ED Notes (Signed)
Pt arrived via [facility] from home c/o SOB x3 days, uses home inhaler.  [facility] gave albuterol neb, yellow phlegm.

## 2016-03-29 ENCOUNTER — Ambulatory Visit (INDEPENDENT_AMBULATORY_CARE_PROVIDER_SITE_OTHER): Payer: Commercial Managed Care - HMO | Admitting: Family

## 2016-03-29 ENCOUNTER — Encounter: Payer: Self-pay | Admitting: Family

## 2016-03-29 VITALS — BP 110/84 | HR 90 | Temp 98.1°F | Resp 16 | Ht 62.0 in | Wt 110.0 lb

## 2016-03-29 DIAGNOSIS — J189 Pneumonia, unspecified organism: Secondary | ICD-10-CM | POA: Diagnosis not present

## 2016-03-29 NOTE — Progress Notes (Deleted)
Subjective:     Patient ID: [name] FootmanRobin L Muse, female   DOB: [birth year], 58 y.o.   MRN: [id number]  HPI :  Onset one week ago with vomiting and diarrhea. Gastric issues got better then developed  body aches, chills, sinus stuffiness, nausea, headache which radiates to right side of neck and minor cough beginning yesterday. Denies sore throat. Has not monitored temperature at home.  Alleviating factors include Pepto Bismol and Motrin which has been somewhat helpful, however symptoms return when wears off. Sleep has been effected. The course of this illness is getting worse over time. Has not taken antibiotics in recent weeks.   Review of Systems  Constitutional: Positive for chills, appetite change and fatigue.  HENT: Positive for congestion, sinus pressure and voice change. Negative for ear pain and trouble swallowing.   Eyes: Negative for photophobia, pain and redness.  Respiratory: Positive for cough. Negative for chest tightness, shortness of breath and wheezing.   Cardiovascular: Negative.   Gastrointestinal: Positive for nausea, vomiting and diarrhea. Negative for anal bleeding.  Genitourinary: Negative.   Allergic/Immunologic: Positive for environmental allergies.       Objective:   Physical Exam  Constitutional: She is oriented to person, place, and time.  HENT:  Right Ear: External ear normal.  Left Ear: External ear normal.  Nose: No rhinorrhea. Right sinus exhibits maxillary sinus tenderness. Left sinus exhibits maxillary sinus tenderness.  Mouth/Throat: No oropharyngeal exudate or posterior oropharyngeal erythema.  Neck: Neck supple.  Cardiovascular: Normal rate, regular rhythm, normal heart sounds and intact distal pulses.   Pulmonary/Chest: Effort normal and breath sounds normal. No respiratory distress. She has no wheezes. She has no rales.  Abdominal: Soft. Bowel sounds are normal.  Lymphadenopathy:    She has no cervical adenopathy.  Neurological: She is alert and  oriented to person, place, and time.       Assessment:     ***    Plan:     ***

## 2016-03-29 NOTE — Progress Notes (Signed)
Pre visit review using our clinic review tool, if applicable. No additional management support is needed unless otherwise documented below in the visit note. 

## 2016-03-29 NOTE — Assessment & Plan Note (Signed)
Continues to experience symptoms of pneumonia most likely related to incomplete treatment from previous prescription secondary to finances. Encouraged to obtain azithromycin and prednisone and complete courses a restrictive medications. Continue previously prescribed medications for symptom management. Follow-up if symptoms worsen or fail to improve.

## 2016-03-29 NOTE — Progress Notes (Signed)
Subjective:    Patient ID: [name] [name], female    DOB: 08-May-1956, 60 y.o.   MRN: [id number]  Chief Complaint  Patient presents with  . Hospitalization Follow-up    still has some SOB and congestion, feels better    HPI:  [name] [name] is a 60 y.o. female who  has a past medical history of Acute bronchitis; Esophageal reflux; Anemia, unspecified; Mild intellectual disabilities; Pneumonia, organism unspecified; Other acute reactions to stress; and Unspecified asthma(493.90). and presents today for a follow-up office visit.  This is a new problem. Recently evaluated in the emergency department with the chief complaint of shortness of breath and chest pain which began several days prior to assessment. Pain is located in the epigastric and sternal regions and nonradiating. No nausea, vomiting, fever, or chills. Physical exam with mild tenderness to palpation of the chest when applied to the sternum. Chest x-ray revealed left lower lobe opacity consistent with pneumonia. EKG showed sinus rhythm and nonspecific T-wave abnormalities. She was treated with albuterol, steroids, and antibiotics with improvement noted. She was discharged in stable condition and advised to follow-up with primary care.  Since leaving the emergency department she completed her course of prednisone and azithromycin. Continues to experience mild shortness of breath and congestion but overall feels better. No fevers. Indicates that she has yet to start the antibiotic and prednisone secondary to her fiances. She continues to maintain her symptoms with her previously prescribed Symbicort.   No Known Allergies   Current Outpatient Prescriptions on File Prior to Visit  Medication Sig Dispense Refill  . albuterol (PROVENTIL HFA;VENTOLIN HFA) 108 (90 Base) MCG/ACT inhaler Inhale 2 puffs into the lungs every 4 (four) hours as needed for wheezing or shortness of breath. 1 Inhaler 1  . budesonide-formoterol (SYMBICORT) 160-4.5  MCG/ACT inhaler Inhale 2 puffs into the lungs 2 (two) times daily. 1 Inhaler 3  . naproxen sodium (ALEVE) 220 MG tablet Take 440 mg by mouth 2 (two) times daily as needed (pain).    . predniSONE (DELTASONE) 20 MG tablet Take 2 tablets (40 mg total) by mouth daily with breakfast. For the next four days 8 tablet 0  . simvastatin (ZOCOR) 20 MG tablet Take 1 tablet (20 mg total) by mouth at bedtime. 90 tablet 3   No current facility-administered medications on file prior to visit.     Past Surgical History  Procedure Laterality Date  . Tonsillectomy       Review of Systems  Constitutional: Negative for fever and chills.  HENT: Positive for congestion. Negative for ear pain, sinus pressure and sore throat.   Respiratory: Positive for cough and shortness of breath. Negative for chest tightness.   Neurological: Negative for headaches.      Objective:    BP 110/84 mmHg  Pulse 90  Temp(Src) 98.1 F (36.7 C) (Oral)  Resp 16  Ht  (1.575 m)  Wt 110 lb (49.896 kg)  BMI 20.11 kg/m2  SpO2 97% Nursing note and vital signs reviewed.  Physical Exam  Constitutional: She is oriented to person, place, and time. She appears well-developed and well-nourished. No distress.  HENT:  Right Ear: Hearing, tympanic membrane, external ear and ear canal normal.  Left Ear: Hearing, tympanic membrane, external ear and ear canal normal.  Nose: Nose normal. Right sinus exhibits no maxillary sinus tenderness and no frontal sinus tenderness. Left sinus exhibits no maxillary sinus tenderness and no frontal sinus tenderness.  Mouth/Throat: Uvula is midline, oropharynx is clear  and moist and mucous membranes are normal.  Cardiovascular: Normal rate, regular rhythm, normal heart sounds and intact distal pulses.   Pulmonary/Chest: Effort normal. She has rales (intermittent ).  Neurological: She is alert and oriented to person, place, and time.  Skin: Skin is warm and dry.  Psychiatric: She has a normal mood  and affect. Her behavior is normal. Judgment and thought content normal.       Assessment & Plan:   Problem List Items Addressed This Visit      Respiratory   CAP (community acquired pneumonia) - Primary    Continues to experience symptoms of pneumonia most likely related to incomplete treatment from previous prescription secondary to finances. Encouraged to obtain azithromycin and prednisone and complete courses a restrictive medications. Continue previously prescribed medications for symptom management. Follow-up if symptoms worsen or fail to improve.          I am having Ms. Klang maintain her budesonide-formoterol, simvastatin, naproxen sodium, albuterol, and predniSONE.    Follow-up: Return if symptoms worsen or fail to improve.  Jeanine Luzalone, Gregory, FNP

## 2016-03-29 NOTE — Patient Instructions (Signed)
Thank you for choosing [facility].  Summary/Instructions:  Please obtain your previously prescribed medications to help with your symptoms.   Continue to take your other medications as prescribed.   If your symptoms worsen or fail to improve, please contact our office for further instruction, or in case of emergency go directly to the emergency room at the closest medical facility.    Community-Acquired Pneumonia, Adult Pneumonia is an infection of the lungs. There are different types of pneumonia. One type can develop while a person is in a hospital. A different type, called community-acquired pneumonia, develops in people who are not, or have not recently been, in the hospital or other health care facility.  CAUSES Pneumonia may be caused by bacteria, viruses, or funguses. Community-acquired pneumonia is often caused by Streptococcus pneumonia bacteria. These bacteria are often passed from one person to another by breathing in droplets from the cough or sneeze of an infected person. RISK FACTORS The condition is more likely to develop in:  People who havechronic diseases, such as chronic obstructive pulmonary disease (COPD), asthma, congestive heart failure, cystic fibrosis, diabetes, or kidney disease.  People who haveearly-stage or late-stage HIV.  People who havesickle cell disease.  People who havehad their spleen removed (splenectomy).  People who havepoor Administratordental hygiene.  People who havemedical conditions that increase the risk of breathing in (aspirating) secretions their own mouth and nose.   People who havea weakened immune system (immunocompromised).  People who smoke.  People whotravel to areas where pneumonia-causing germs commonly exist.  People whoare around animal habitats or animals that have pneumonia-causing germs, including birds, bats, rabbits, cats, and farm animals. SYMPTOMS Symptoms of this condition include:  Adry cough.  A wet  (productive) cough.  Fever.  Sweating.  Chest pain, especially when breathing deeply or coughing.  Rapid breathing or difficulty breathing.  Shortness of breath.  Shaking chills.  Fatigue.  Muscle aches. DIAGNOSIS Your health care provider will take a medical history and perform a physical exam. You may also have other tests, including:  Imaging studies of your chest, including X-rays.  Tests to check your blood oxygen level and other blood gases.  Other tests on blood, mucus (sputum), fluid around your lungs (pleural fluid), and urine. If your pneumonia is severe, other tests may be done to identify the specific cause of your illness. TREATMENT The type of treatment that you receive depends on many factors, such as the cause of your pneumonia, the medicines you take, and other medical conditions that you have. For most adults, treatment and recovery from pneumonia may occur at home. In some cases, treatment must happen in a hospital. Treatment may include:  Antibiotic medicines, if the pneumonia was caused by bacteria.  Antiviral medicines, if the pneumonia was caused by a virus.  Medicines that are given by mouth or through an IV tube.  Oxygen.  Respiratory therapy. Although rare, treating severe pneumonia may include:  Mechanical ventilation. This is done if you are not breathing well on your own and you cannot maintain a safe blood oxygen level.  Thoracentesis. This procedureremoves fluid around one lung or both lungs to help you breathe better. HOME CARE INSTRUCTIONS  Take over-the-counter and prescription medicines only as told by your health care provider.  Only takecough medicine if you are losing sleep. Understand that cough medicine can prevent your body's natural ability to remove mucus from your lungs.  If you were prescribed an antibiotic medicine, take it as told by your health care  provider. Do not stop taking the antibiotic even if you start to feel  better.  Sleep in a semi-upright position at night. Try sleeping in a reclining chair, or place a few pillows under your head.  Do not use tobacco products, including cigarettes, chewing tobacco, and e-cigarettes. If you need help quitting, ask your health care provider.  Drink enough water to keep your urine clear or pale yellow. This will help to thin out mucus secretions in your lungs. PREVENTION There are ways that you can decrease your risk of developing community-acquired pneumonia. Consider getting a pneumococcal vaccine if:  You are older than 58 years of age.  You are older than 58 years of age and are undergoing cancer treatment, have chronic lung disease, or have other medical conditions that affect your immune system. Ask your health care provider if this applies to you. There are different types and schedules of pneumococcal vaccines. Ask your health care provider which vaccination option is best for you. You may also prevent community-acquired pneumonia if you take these actions:  Get an influenza vaccine every year. Ask your health care provider which type of influenza vaccine is best for you.  Go to the dentist on a regular basis.  Wash your hands often. Use hand sanitizer if soap and water are not available. SEEK MEDICAL CARE IF:  You have a fever.  You are losing sleep because you cannot control your cough with cough medicine. SEEK IMMEDIATE MEDICAL CARE IF:  You have worsening shortness of breath.  You have increased chest pain.  Your sickness becomes worse, especially if you are an older adult or have a weakened immune system.  You cough up blood.   This information is not intended to replace advice given to you by your health care provider. Make sure you discuss any questions you have with your health care provider.   Document Released: 12/12/2005 Document Revised: 09/02/2015 Document Reviewed: 04/08/2015 Elsevier Interactive Patient Education [employer].

## 2016-04-08 ENCOUNTER — Ambulatory Visit: Payer: Self-pay

## 2016-04-12 ENCOUNTER — Ambulatory Visit: Payer: Self-pay

## 2016-04-13 ENCOUNTER — Ambulatory Visit: Payer: Self-pay

## 2016-04-14 ENCOUNTER — Other Ambulatory Visit: Payer: Self-pay

## 2016-04-14 DIAGNOSIS — J189 Pneumonia, unspecified organism: Secondary | ICD-10-CM

## 2016-04-14 NOTE — Patient Outreach (Addendum)
[facility]) Care Management  04/14/2016  [name] [name] Jun 24, 1956 [id number]  Screening  Referral Date:  04/05/2016 Source:  Insurance Plan: Silver Back  Issue:  "Member identified via ED follow-up outreach.  Seen in ED on 03/29/16 with SOB and chest pain.   PMH:  Asthma, GERD, Anemia, "mild intellectual disabilities."  Seen in [facility] Care Physician] office on 03/29/16 and diagnosed with PNA as a result of not getting [antibiotic] filled.  Reports difficulty obtaining medications due to finances.  May benefit from CM for pharmacy review and SW for community resources and possible Medicaid application assistance."   Outreach call #1 to patient.  Patient reached and completed call.  PHI verification completed.   Providers: Primary MD: Dr. Hillard Danker -  last appt: 10/14/2015     next appt:  Oct.  [name] Speak, FNP, [facility] - last appt 03/29/16 States she also uses Urgent Care on [address] as needed. HH: none  Insurance:  [employer], [place] Medicaid  Social: Patient lives in her home with her sister who patient states is also unable to work. Mobility: walking without any assistive devices Falls:  None Pain: None Depression: None.  H/o past [facility] Nurse visit in the past but patient states no longer coming and unable to state the date of last visit.  Denies going to [place] location for any active services.  Risk Assessment:  yes Transportation:  CIGNA, SCAT - patient unable to confirm if she has been approved for SCAT but has the number.  Reports issues with scheduling on time.  Caregiver: Sister, Markesia Crilly Advance Directive:  None and has never thought about this.   Resources: -Well Dine Program:  2 boxes of food from [place]  2017 -[place] Medicaid, SSI and SS  $272 and  $483  currently goes to payee, Sherran Needs.   Patient would like to change this  to be  her own payee.  Patient confirms she has [place] Medicaid.  Consent:  yes DME:  reading glasses  Co-morbidities:  Chronic Asthma, GERD, Anemia, "mild intellectual disabilities," PNA (03/29/16)  ED visits:  (1)  03/20/16 PNA Admissions: (0) Primary Care follow-up appt 03/29/16 revealed patient did not fill Antibiotic ordered on 03/29/16 ED visit for PNA.   Patient reported this was due to financial issues.  Patient confirmed co-pays from $0-3 or 4 dollars with [place] Medicaid.  Patient stated issues with her cousin being the payee of her SS income dollars and lives across town with a busy schedule.  Patient states she would like to have changed and become the payee herself so that the money comes directly to her. Azithromycin:  Patient able to states instructions to take 2 tablets 1st day and  then 1 tab daily until medication completed. Patient unable to give specifics on what date she picked up the medication but states she has 6 tablets left.   Patient reports using / sharing her sister's nebulizer and medications and Oxygen.     Medications:  Patient taking less than 15 medications Co-pay cost issues:  $0 - 3 or 4 dollars Flu Vaccine:  patient unable to provide information but per [facility] MR:  10/14/2015 Pneumonia Vaccine:  Pharmacy:  [facility] States new prescription for Ventolin Inhaler and Azithromycin.   Objective:   Encounter Medications:  Outpatient Encounter Prescriptions as of 04/14/2016  Medication Sig  . albuterol (PROVENTIL HFA;VENTOLIN HFA) 108 (90 Base) MCG/ACT inhaler Inhale 2 puffs into the lungs every 4 (four) hours  as needed for wheezing or shortness of breath.  Marland Kitchen azithromycin (ZITHROMAX) 500 MG tablet Take 500 mg by mouth daily. Take 2 tablets (500 mg total) by mouth daily. Take first 2 tablets together, then 1 every day until finished.  . budesonide-formoterol (SYMBICORT) 160-4.5 MCG/ACT inhaler Inhale 2 puffs into the lungs 2 (two) times daily.  . naproxen sodium (ALEVE) 220 MG tablet Take 440 mg by mouth 2 (two) times daily as needed (pain).  . predniSONE  (DELTASONE) 20 MG tablet Take 2 tablets (40 mg total) by mouth daily with breakfast. For the next four days  . simvastatin (ZOCOR) 20 MG tablet Take 1 tablet (20 mg total) by mouth at bedtime.   No facility-administered encounter medications on file as of 04/14/2016.    Functional Status:  In your present state of health, do you have any difficulty performing the following activities: 04/14/2016  Hearing? N  Vision? N  Difficulty concentrating or making decisions? Y  Walking or climbing stairs? N  Dressing or bathing? N  Doing errands, shopping? N  Preparing Food and eating ? N  Using the Toilet? N  In the past six months, have you accidently leaked urine? N  Do you have problems with loss of bowel control? N  Managing your Medications? N  Managing your Finances? Y  Housekeeping or managing your Housekeeping? N    Fall/Depression Screening: PHQ 2/9 Scores 04/14/2016 12/16/2014  PHQ - 2 Score 0 0    Assessment: Health care management barriers associate to: -Self management tools and resources needed due to learning barrier associated to "mild intellectual disability." -Chronic Asthma Disease Management Education deficit associated to treatment compliance and correct use of medications.  -Transportation knowledge deficit:  Lacks understanding of use of SCAT services versus possible need for supervisory caregiver assistance.  -Community Resources:  Food associated to financial versus transportation  -Medication Adherence associated to getting medications due to availability of money rather than affordability.  SW Referral to assess and assist if patient can update payee status and / or improve accessibility to funds needed to provide payment for prescription medications needed in a timely manner to meet demands of healthcare management. -Medication Adherence associated to not getting medications on timely manner and taking compliantly relating to possible lack of ability to understand  instructions and adherence.  -Medication Safety Risk  1) associated to patient using / sharing sisters nebulizer and nebulizer medications and home oxygen prescribed for sister and not patient.  2) difficulty reading medication labels associated to poor vision and no Eye exam over the past several years.  Using readers to supplement  ED AVS Chart Review for Antibiotic order:  azithromycin (ZITHROMAX) 250 MG tablet    Sig: Take 2 tablets (500 mg total) by mouth daily. Take first 2 tablets together, then 1 every day until finished.     Start: 03/21/16    Quantity: 10 tablet Refills:           Plan:  Referral Date 04/05/2016 Screening and partial Initial Assessment:  04/14/2016  [facility] Community RN CM Referral  -ED visit over the past 30 days -Patient needs Primary Care follow-up to assess resolution of PNA due to non-adherence to taking prescribed antibiotic in timely and compliant way.  -Assess, teach, monitor for correct use of inhalers  [facility] Social Work Referral -Community Resources:  Horticulturist, commercial -SSI/SS payee assistance  [facility] Pharmacy Referral -medication adherence  -medication safety  -medication self management tools -Pharmacy Referral for assessment and resources to improve  patient's ability to self manage her medications in a consistent and compliant manner versus possible need for caregiver assistance to supervise medication regime.    RN CM advised patient in next [facility] call within the next 10 business days.  RN CM advised to please notify MD of any changes in condition prior to scheduled appt's: productive cough, fever, shortness of breath.   RN CM provided contact name and # [phone] or main office # [phone] and 24-hour nurse line # [phone].  RN CM confirmed patient is aware of 911 services for urgent emergency needs.  Donato Schultzrystal Taequan Stockhausen, RN, BSN, [facility], CCM  [facility] Care Management Care Management  Coordinator [phone] Direct [phone] Cell [phone] Office [phone] Fax

## 2016-04-15 ENCOUNTER — Other Ambulatory Visit: Payer: Self-pay | Admitting: Licensed Clinical Social Worker

## 2016-04-15 NOTE — Patient Outreach (Signed)
[facility]) Care Management  04/15/2016  [name] FootmanRobin L [name] 25-Jul-1956 [id number]   Assessment-CSW completed initial outreach to patient after receiving referral from food, payee assistance and transportation. Patient answered and provided HIPPA verifications. CSW introduced self, reason for call and of [facility] Care [facility] Assistance. Patient agreeable to social work services. Patient is very pleasant over phone. Patient unsure if she has SCAT. CSW completed outreach on another line (with patient permission) and confirmed that she does NOT have SCAT. Patient is interested in receiving SCAT services. Education on transportation resources and food resources provided. Patient is agreeable to home visit on 04/19/16.  Plan-CSW will complete home visit on 04/19/16.  Dickie LaBrooke Takashi Korol, BSW, MSW, LCSW [facility] Care Management [email or web address] Phone: [phone] Fax: [phone]

## 2016-04-18 ENCOUNTER — Other Ambulatory Visit: Payer: Self-pay

## 2016-04-18 NOTE — Patient Outreach (Signed)
Initial telephone contact for community care coordination. Patient was referred to [facility] for community care coordination and chronic disease education, management.  Patient identified herself by providing date of birth and address. Patient was agreeable to this initial assessment.  Patient requested the following information for creation of care management goals: She and her sister share a nebulizer, medications, She states she does not understand COPD.   Patient and this RNCM collaborated to formulate the case management care plan; which emphasizes need for COPD education, connecting with community resources for medications.  Plan: Home visit next week for further assistance with community care coordination

## 2016-04-19 ENCOUNTER — Encounter: Payer: Self-pay | Admitting: Licensed Clinical Social Worker

## 2016-04-19 ENCOUNTER — Other Ambulatory Visit: Payer: Self-pay | Admitting: Licensed Clinical Social Worker

## 2016-04-19 NOTE — Patient Outreach (Signed)
[name]) Care Management  [name] Social Work  04/19/2016  [name] [name] 09/12/1956 [id number]   Encounter Medications:  Outpatient Encounter Prescriptions as of 04/19/2016  Medication Sig  . albuterol (PROVENTIL HFA;VENTOLIN HFA) 108 (90 Base) MCG/ACT inhaler Inhale 2 puffs into the lungs every 4 (four) hours as needed for wheezing or shortness of breath.  Marland Kitchen azithromycin (ZITHROMAX) 500 MG tablet Take 500 mg by mouth daily. Take 2 tablets (500 mg total) by mouth daily. Take first 2 tablets together, then 1 every day until finished.  . budesonide-formoterol (SYMBICORT) 160-4.5 MCG/ACT inhaler Inhale 2 puffs into the lungs 2 (two) times daily.  . naproxen sodium (ALEVE) 220 MG tablet Take 440 mg by mouth 2 (two) times daily as needed (pain).  . predniSONE (DELTASONE) 20 MG tablet Take 2 tablets (40 mg total) by mouth daily with breakfast. For the next four days  . simvastatin (ZOCOR) 20 MG tablet Take 1 tablet (20 mg total) by mouth at bedtime.   No facility-administered encounter medications on file as of 04/19/2016.    Functional Status:  In your present state of health, do you have any difficulty performing the following activities: 04/14/2016  Hearing? N  Vision? N  Difficulty concentrating or making decisions? Y  Walking or climbing stairs? N  Dressing or bathing? N  Doing errands, shopping? N  Preparing Food and eating ? N  Using the Toilet? N  In the past six months, have you accidently leaked urine? N  Do you have problems with loss of bowel control? N  Managing your Medications? N  Managing your Finances? Y  Housekeeping or managing your Housekeeping? N    Fall/Depression Screening:  PHQ 2/9 Scores 04/19/2016 04/14/2016 12/16/2014  PHQ - 2 Score 0 0 0    Assessment:   CSW completed home visit on 04/19/16. Patient shares that she is doing well today and is not experiencing any SOB or chest pain. Patient is a 60 year old female with asthma and mild  intellectual disabilities. Patient lives with her sister [name] [name]. Patient is interested in gaining appropriate community resources. CSW provided patient with a Tax inspector for [facility], [employer], [employer] [place] [employer] and a list of available food assistance resources. CSW completed review of resources with patient but will need to complete additional reviews in the future. Patient shares that at times she struggles with affording food. She shares that she has been to food pantries and places where she received a free meal in the past. Patient is eager to start using resources. Patient reports that her sister has used the financial resources in the past that CSW provided.  Patient is also in need of transportation. CSW confirmed patient did not have SCAT as patient was unsure. CSW provided a list of transportation resources and was also given directions on how to schedule a ride with [name] [name] transportation. Patient was unaware that she had transportation benefit with [employer]. Patient wishes to gain SCAT services. CSW completed SCAT application with patient. CSW will fax completed application to SCAT by 04/22/16.   Patient is interested in receiving information on becoming her own payee. She reports that her cousin is her payee and that they get along well. She shares that she handles her money responsibly but that she wishes to gain independence and be her own payee. Patient shares that she has an Office manager with [employer] who she usually goes to for questions and was educated to go to DSS to  apply to be her own payee. CSW provided number to DSS payee representative program. CSW informed her that she would need to show that she is capable of managing or directing the management of her money and that sometimes a physician note can assist with this. Patient expressed understanding. CSW successfully provided education on how to change status of payee.   Patient denies wishing to  complete advance directive. Patient denies any depression or sadness. [facility] but denies needing mental health resources. Patient shares that she has "one or two" friends that she socializes with and that help her "every once in awhile." Patient reports that she finds pleasure in life by doing for her self and keeping herself busy. Patient reports that she enjoys cleaning, mowing the lawn, watching television occasionally and being around her pets.  Plan: CSW will complete next outreach within two weeks. CSW will send encounter to PCP. CSW will send involvement letter to PCP.  Dickie LaBrooke Dael Howland, BSW, MSW, LCSW [facility] Care Management [email or web address] Phone: [phone] Fax: [phone]

## 2016-04-27 ENCOUNTER — Other Ambulatory Visit: Payer: Self-pay | Admitting: Licensed Clinical Social Worker

## 2016-04-27 NOTE — Patient Outreach (Signed)
[facility]) Care Management  04/27/2016  Jimmy FootmanRobin L Vogelgesang [birth year] [id number]   Assessment-CSW completed outreach attempt today. CSW unable to reach patient successfully. CSW unable to leave voice message as phone continuously rang.  Plan-CSW will complete an additional outreach if needed.  Dickie LaBrooke Shalyn Koral, BSW, MSW, LCSW [facility] Care Management [email or web address] Phone: [phone] Fax: [phone]

## 2016-04-28 ENCOUNTER — Other Ambulatory Visit: Payer: Self-pay | Admitting: Licensed Clinical Social Worker

## 2016-04-28 ENCOUNTER — Other Ambulatory Visit: Payer: Self-pay

## 2016-04-28 NOTE — Patient Outreach (Signed)
[facility]) Care Management  04/28/2016  Jimmy FootmanRobin L Sweeny [birth year] [id number]   Assessment- CSW received incoming call from patient. Patient shares that she has not heard from SCAT yet. CSW reminded her that SCAT will only make one outreach attempt and if they are unable to reach her they will mail out a letter requesting that she give them a call to set up an assessment appointment.  CSW sent secure email to [facility] Allison with SCAT. Victorino DikeJennifer states that application was received and that she will be contacting patient soon.  Plan- CSW will follow up in one week.  Dickie LaBrooke Kemond Amorin, BSW, MSW, LCSW [facility] Care Management [email or web address] Phone: [phone] Fax: [phone]

## 2016-04-29 NOTE — Patient Outreach (Signed)
[facility]) Care Management  Apr 28, 2016    [name] FootmanRobin L [name] 1956-06-27 [id number]   Initial home assessment for community care coordination. Patient and RNCM collaborated to create her case management care plan. Patient states she and her sister, [name] [name] share nebulizer and inhalers, she uses her sister's medications.  Findings: Patient lives in single family home with her sister. Patient lives on limited income from social security. Patient is  Independent with ADLs and IADLs, is very independent.  Patient and this RNCM viewed EMMI COPD Video. Patient was very attentive and asked questions during the showing of the videos.  Plan: Home visit later this month for further COPD Education, assessment of need for community care coordination.

## 2016-05-02 ENCOUNTER — Other Ambulatory Visit: Payer: Self-pay

## 2016-05-02 NOTE — Patient Outreach (Signed)
I called Ms. [name] [name] and verified her name and date of birth.  I asked if I could make a home visit on May 18th at 12:30 to review her medications.  She stated that would be fine.    Steve Rattlerawn Rodell Marrs, PharmD, [employer] CommunicationsBCACP [facility] Pharmacy Manager [phone]

## 2016-05-05 ENCOUNTER — Other Ambulatory Visit: Payer: Self-pay | Admitting: Licensed Clinical Social Worker

## 2016-05-05 NOTE — Patient Outreach (Signed)
[facility]N) Care Management  05/05/2016  [name] FootmanRobin L [name] 11/15/1956 [id number]   Assessment-CSW received call from [place] BingJennifer Allison with SCAT. Victorino DikeJennifer wished to know if patient can travel by herself and CSW confirmed that she can. CSW provided additional information on patient. [place] BingJennifer Allison sent secure email stating that she had scheduled patient's SCAT assessment appointment for 05/16/16 at 10:00 am.  Plan-CSW will complete outreach on 05/13/16 to remind patient.  Dickie LaBrooke Johnchristopher Sarvis, BSW, MSW, LCSW [facility] Care [facility] [email or web address] Phone: [phone] Fax: [phone]

## 2016-05-12 ENCOUNTER — Other Ambulatory Visit: Payer: Self-pay

## 2016-05-12 NOTE — Patient Outreach (Signed)
[facility]) Care Management  [facility]   05/12/2016  [name] FootmanRobin L [name] Aug 22, 1956 [id number]  Subjective: Ms. [name] [name] is a 60 year old female who was referred to me for medication adherence and management.  She is on very few medications.  She showed me her medications and she had azithromycin from 03/29/16.  She stated she was taking it but the bottle was full (10 pills).  She was able to read exactly how she was supposed to take it but could not tell me why it was prescribed for her.  Once I explained she was given it after her ED visit in March, she remember that she had an infection. She could not tell me why she did not take it then.  She also had Simvastatin.  She stated she takes it every night.  I counted the pills and she had 60 pills left and it was filled on 03/02/16.  She should have about 3 weeks left.  She did not understand that she was missing her medications routinely.  She really did not want to have a pill box due to only being on one pill.  I explained how it could assist as a visual aid to remind her if she took her medication or not.  She stated she would give it a try. I asked her to demonstrate her Symbicort inhaler.  She took 2 puffs at one time and did not exhale or hold her breathe for 10 seconds afterwards.  She did not realize she was supposed to her inhalers this way.  She did demonstrate the correct technique after I showed her a couple of times.    Objective: ,   Encounter Medications: Outpatient Encounter Prescriptions as of 05/12/2016  Medication Sig Note  . albuterol (PROVENTIL HFA;VENTOLIN HFA) 108 (90 Base) MCG/ACT inhaler Inhale 2 puffs into the lungs every 4 (four) hours as needed for wheezing or shortness of breath. 05/12/2016: Filled on 03/29/16 but has not had to use it.  Had 200 puffs left on the MDI  . budesonide-formoterol (SYMBICORT) 160-4.5 MCG/ACT inhaler Inhale 2 puffs into the lungs 2 (two) times daily. 05/12/2016: Has 42 puffs left on  MDI on 05/12/16  . naproxen sodium (ALEVE) 220 MG tablet Take 440 mg by mouth 2 (two) times daily as needed (pain). Reported on 05/12/2016   . simvastatin (ZOCOR) 20 MG tablet Take 1 tablet (20 mg total) by mouth at bedtime.   . [DISCONTINUED] azithromycin (ZITHROMAX) 500 MG tablet Take 500 mg by mouth daily. Reported on 05/12/2016   . [DISCONTINUED] predniSONE (DELTASONE) 20 MG tablet Take 2 tablets (40 mg total) by mouth daily with breakfast. For the next four days (Patient not taking: Reported on 05/12/2016)    No facility-administered encounter medications on file as of 05/12/2016.    Functional Status: In your present state of health, do you have any difficulty performing the following activities: 04/28/2016 04/14/2016  Hearing? N N  Vision? Y N  Difficulty concentrating or making decisions? N Y  Walking or climbing stairs? N N  Dressing or bathing? N N  Doing errands, shopping? N N  Preparing Food and eating ? N N  Using the Toilet? N N  In the past six months, have you accidently leaked urine? N N  Do you have problems with loss of bowel control? N N  Managing your Medications? N N  Managing your Finances? N Y  Housekeeping or managing your Housekeeping? N N    Fall/Depression  Screening: PHQ 2/9 Scores 04/28/2016 04/19/2016 04/14/2016 12/16/2014  PHQ - 2 Score 0 0 0 0    Assessment: 1.  Medication adherence:  She is not adherent to her medications although she does not realized how non-adherent she is.  Implementing a pill box system may help with her adherence.  She also may benefit from a bubble pack where it is prepackage for her.   2.  Inhaler technique:  She did not have correct inhaler technique.  She may benefit from a spacer which will eliminate a lot of the technique she is having difficulty with.   Plan: 1.  I demonstrated proper inhaler technique and used the teach back method with [name] [name].  She was able to demonstrate proper technique.  I do have concern that she will  be able to retain the information that I gave her and demonstrated with her.  2.  I supplied [name] [name] with a pill box to assist with her adherence with her simvastatin.  She will fill it up on a weekly basis. If this does not work, I will consider a bubble pack with her.  3.  I will call [name] [name] in 5 days to see how many puffs she has left on her Symbicort.  This will indicate to me if she is using it properly.  She should only have 20 puffs left.  4.  I will follow up in 4 weeks for monitor her compliance as well as her understanding of her medications.  I will also review her inhaler technique.    [facility] CM Care Plan Problem One        Most Recent Value   Care Plan Problem One  Medication Adherence   Role Documenting the Problem One  Clinical Pharmacist   Care Plan for Problem One  Active   [facility]N CM Short Term Goal #1 (0-30 days)  [name] [name] will be adherent to her simvastatin and Symbicort at least 70% of the time over the next 30 days evident by pharmacist pill count of Simvastatin (has 60 today) and review of puffs on Symbicort (42 puffs left today)   [facility]N CM Short Term Goal #1 Start Date  05/12/16   Interventions for Short Term Goal #1  I counseled [name] [name] on the appropriate way to use her Symbicort.  She was able to demostrate proper technique.  I counted the Simvastatin and explained to her why she was not adherent to it.  I showed her a calendar to help explain why I could tell that she had missed at least 2 weeks of Simvastatin.  I gave her a pill box to help remember to take her simvastatin     Steve Rattler, PharmD, [employer] [facility] [employer] [phone]

## 2016-05-12 NOTE — Patient Outreach (Addendum)
[place] [facility]) Care Management  05/12/2016  [name] [name] November 05, 1956 [id number]    This RNCM and Beverly Milch, [facility] RN met with patient in her home for community care coordination and COPD Education.    Patient and this RNCM viewed EMMI video on COPD and medication management. Patient and this RNCM reviewed the 3 Zones of the COPD Action Plan.   With much coaching, patient was able to name the 3 zones, needed continued coaching to remember actions to be taken during each zone.  This RNCM and patient agreed to home visit later for this RNCM to deliver additional visual cues to assist patient in remembering what actions to take for each zone.   RNCM and patient reviewed her case management care plan and updated. Patient reminded that Dr. Deanne Coffer, [facility] is scheduled for home visit with her today.

## 2016-05-13 ENCOUNTER — Other Ambulatory Visit: Payer: Self-pay | Admitting: Licensed Clinical Social Worker

## 2016-05-13 NOTE — Patient Outreach (Signed)
[facility]N) Care Management  05/13/2016  Jimmy FootmanRobin L Bulger 10-11-1956 [id number]   Assessment- CSW completed outreach to patient and she answered. CSW reminded her of her upcoming SCAT appointment. She states that she contacted them this morning to remind them that she will need their transportation to get to the assessment appointment. Patient was able to read back appointment date and time.  Plan-CSW will complete outreach within one week to follow up on SCAT and social work needs.  Dickie LaBrooke Yassir Enis, BSW, MSW, LCSW [facility] Care Management [email or web address] Phone: [phone] Fax: [phone]

## 2016-05-16 ENCOUNTER — Other Ambulatory Visit: Payer: Self-pay | Admitting: Licensed Clinical Social Worker

## 2016-05-16 NOTE — Patient Outreach (Signed)
[place] [facility]) Care Management  05/16/2016  [name] [name] 12/01/1956 [id number]   Assessment- CSW completed outreach to patient and patient answered. Patient reports that she went to SCAT assessment today and was approved for services. Patient now has stable transportation. Goal met. CSW questioned if she could complete home visit tomorrow in order to review community resources again and patient agreed.  Plan-CSW will complete home visit tomorrow.  Eula Fried, BSW, MSW, [email or web address] Phone: [phone] Fax: [phone]

## 2016-05-17 ENCOUNTER — Other Ambulatory Visit: Payer: Self-pay | Admitting: Licensed Clinical Social Worker

## 2016-05-17 NOTE — Patient Outreach (Signed)
[facility]) Care Management  [facility] Social Work  05/17/2016  [name] [name] Dec 05, 1956 [id number]   Encounter Medications:  Outpatient Encounter Prescriptions as of 05/17/2016  Medication Sig Note  . albuterol (PROVENTIL HFA;VENTOLIN HFA) 108 (90 Base) MCG/ACT inhaler Inhale 2 puffs into the lungs every 4 (four) hours as needed for wheezing or shortness of breath. 05/12/2016: Filled on 03/29/16 but has not had to use it.  Had 200 puffs left on the MDI  . budesonide-formoterol (SYMBICORT) 160-4.5 MCG/ACT inhaler Inhale 2 puffs into the lungs 2 (two) times daily. 05/12/2016: Has 42 puffs left on MDI on 05/12/16  . naproxen sodium (ALEVE) 220 MG tablet Take 440 mg by mouth 2 (two) times daily as needed (pain). Reported on 05/12/2016   . simvastatin (ZOCOR) 20 MG tablet Take 1 tablet (20 mg total) by mouth at bedtime.    No facility-administered encounter medications on file as of 05/17/2016.    Functional Status:  In your present state of health, do you have any difficulty performing the following activities: 04/28/2016 04/14/2016  Hearing? N N  Vision? Y N  Difficulty concentrating or making decisions? N Y  Walking or climbing stairs? N N  Dressing or bathing? N N  Doing errands, shopping? N N  Preparing Food and eating ? N N  Using the Toilet? N N  In the past six months, have you accidently leaked urine? N N  Do you have problems with loss of bowel control? N N  Managing your Medications? N N  Managing your Finances? N Y  Housekeeping or managing your Housekeeping? N N    Fall/Depression Screening:  PHQ 2/9 Scores 04/28/2016 04/19/2016 04/14/2016 12/16/2014  PHQ - 2 Score 0 0 0 0    Assessment: CSW completed home visit on 05/17/16. Patient's sister was present during home visit. Patient showed CSW patient's SCAT ID badge and information on how to set up transportation arrangements with SCAT. CSW reviewed these directions with patient as she did have some confusion.  Patient's sister had questions in regards to SCAT and also in regards to how she can get SCAT as well. CSW wanted to complete review of community resources again as patient needs review of information frequently due to cognitive impairments. Patient was able to find community resource information that was previously provided to her during last home visit. CSW completed review of financial assistance programs, food pantries and where to get a free mail daily in the community. Patient's sister had further questions in regards to this resource information. Patient plans on using SCAT to go to food pantries soon. Patient reports that she does have food in the house at this time. Patient denies any pain or discomfort. Patient admits that she has been forgetting to take her medications on the weekends. CSW educated her on the important of medical adherence. Patient expresses understanding. CSW also educated patient on the payee program again. Patient does not wish to follow through with this at this time but appreciates information provided. CSW will complete routine home visit next month. Patient does not wish to schedule this yet.  Plan: CSW will complete routine home visit next month and continue to provide social work assistance and education on community resources.  [facility] CM Care Plan Problem One        Most Recent Value   Care Plan Problem One  Lack of community resources in order to improve health and well-being   Role Documenting the Problem One  Clinical Social  Worker   Care Plan for Problem One  Active   [facility]N Long Term Goal (31-90 days)  Patient will gain stable transportation within 90 days   [facility]N Long Term Goal Start Date  04/15/16   [facility] Long Term Goal Met Date  05/16/16   Interventions for Problem One Long Term Goal  Patient went to SCAT assessment appointment today and was approved for SCAT services. Goal met.   [facility]N CM Short Term Goal #1 (0-30 days)  Patient will gain resources of food supply and  assistance programs within 30 days   [facility] CM Short Term Goal #1 Start Date  04/15/16   [facility] CM Short Term Goal #1 Met Date  05/17/16   Interventions for Short Term Goal #1  CSW will complete home visit in order to provide and review available food pantries and food assistance programs within 30 days   [facility]N CM Short Term Goal #2 (0-30 days)  Patient wishes to gain education on payee program within 30 days   [facility] CM Short Term Goal #2 Start Date  04/15/16   [facility] CM Short Term Goal #2 Met Date  05/17/16   Interventions for Short Term Goal #2  CSW will complete home visi ton 04/18/16 and provide education on the payee program and the requirements in order for her to understand if and how she can be her on payee if eligibile.    [facility]N CM Short Term Goal #3 (0-30 days)  Patient will be able to read back community resource information within 30 days   [facility]N CM Short Term Goal #3 Start Date  05/17/16   Interventions for Short Tern Goal #3  CSW has completed review of community resources two times and patient will be able to [employer] information within 30 days with assistance of CSW if needed.       Eula Fried, BSW, MSW, [email or web address] Phone: [phone] Fax: [phone]

## 2016-05-26 ENCOUNTER — Other Ambulatory Visit: Payer: Self-pay

## 2016-05-27 NOTE — Patient Outreach (Signed)
[facility] Care Management  05/27/2016  [name] FootmanRobin L Chesbro Dec 12, 1956 [id number]   Home visit to follow up with COPD Management, assess progression on care management care plan goals. Patient denies any asthma exacerbation since last meeting with patient.  Patient and RNCM reviewed her case management care plan. Patient was provided [place] Calendar which lists states of COPD Action Plan and interventions. Patient was also given a color copy for her refrigerator to use as a quick reference.  Patient was able, with coaching, to read off the Zones and intervention. Patient requested her sister to be present to "learn about the interventions also." Patient was also reminded of the 24/7 Nurse Call Line she can use anytime, if needed. This RNCM assisted patient in placing magnet on her refrigerator for her and her sister's use.  Plan: Discharge patient from case load by sending case management closure letter to patient and primary care physician.

## 2016-06-02 ENCOUNTER — Other Ambulatory Visit: Payer: Self-pay | Admitting: Licensed Clinical Social Worker

## 2016-06-02 NOTE — Patient Outreach (Signed)
[facility]) Care Management  06/02/2016  Jimmy FootmanRobin L Braggs [birth year] [id number]   Assessment- CSW completed outreach to patient and she answered successfully. CSW stated that the purpose of this call was to remind her that this CSW will be out of the office all next week and if she needed social work assistance she could contact [facility] CSW [employer] BrothersChrystal Land who is covering her next week. Patient expressed understanding.  Plan-CSW will follow up with patient in three weeks.  Dickie LaBrooke Salif Tay, BSW, MSW, LCSW [facility] Care Management [email or web address] Phone: [phone] Fax: [phone]

## 2016-06-09 ENCOUNTER — Other Ambulatory Visit: Payer: Self-pay

## 2016-06-09 NOTE — Patient Outreach (Signed)
[place] [facility]) Care Management  [place]   06/09/2016  [name] [name] 1956/12/12 [id number]  Subjective: [name] [name] is a 60 year old female who was referred to me for medication adherence.  I am completing a home visit to assess her adherence to her simvastatin and Symbicort.   She stated she has been taking her simvastatin.  She stated she may have missed a few doses on the weekends.  I counted her simvastatin tablets and she had 36 tablets left.  She should have 32 tablets left so she is doing much better with her adherence to simvastatin.  Her Symbicort still has 42 puffs left on it.  She stated she only uses it when she is short of breath.  She did not understand that she was supposed to use it every day twice a day.  I explained the difference between a controller and reliever and she stated understanding.  I asked her to demonstrate her inhaler technique. She inhaled 2 puffs at one time.     Objective:   Encounter Medications: Outpatient Encounter Prescriptions as of 06/09/2016  Medication Sig Note  . albuterol (PROVENTIL HFA;VENTOLIN HFA) 108 (90 Base) MCG/ACT inhaler Inhale 2 puffs into the lungs every 4 (four) hours as needed for wheezing or shortness of breath. 05/12/2016: Filled on 03/29/16 but has not had to use it.  Had 200 puffs left on the MDI  . naproxen sodium (ALEVE) 220 MG tablet Take 440 mg by mouth 2 (two) times daily as needed (pain). Reported on 05/12/2016   . simvastatin (ZOCOR) 20 MG tablet Take 1 tablet (20 mg total) by mouth at bedtime.   . budesonide-formoterol (SYMBICORT) 160-4.5 MCG/ACT inhaler Inhale 2 puffs into the lungs 2 (two) times daily. (Patient not taking: Reported on 06/09/2016) 06/09/2016: Has 42 puffs left of MD1 on 06/09/16   No facility-administered encounter medications on file as of 06/09/2016.    Functional Status: In your present state of health, do you have any difficulty performing the following activities: 04/28/2016 04/14/2016   Hearing? N N  Vision? Y N  Difficulty concentrating or making decisions? N Y  Walking or climbing stairs? N N  Dressing or bathing? N N  Doing errands, shopping? N N  Preparing Food and eating ? N N  Using the Toilet? N N  In the past six months, have you accidently leaked urine? N N  Do you have problems with loss of bowel control? N N  Managing your Medications? N N  Managing your Finances? N Y  Housekeeping or managing your Housekeeping? N N    Fall/Depression Screening: PHQ 2/9 Scores 04/28/2016 04/19/2016 04/14/2016 12/16/2014  PHQ - 2 Score 0 0 0 0    Assessment: 1.  Medication adherence:  She is more adherent to her simvastatin but is not adherent to her Symbicort.  She does not understand that the Symbicort is a medication that is to daily.  She believes that she is only use it when she has symptoms.  I spent to majority of my visit explaining the difference between a controller inhaler and a relief medication.    2.  Inhaler technique:  She did not demonstrate proper inhaler technique.  I went verbally explained and actually demonstrated proper inhale technique to her.  I am not sure she will be able to remember how to use it.  She would benefit from a spacer which would remove most inhaler technique burden that she is having difficulty with remembering.  Plan: 1.  I congratulated [name] [name] on her success with taking her simvastatin.  I stressed that she now needed be adherent with her Symbicort.  2.  I re-educated her on the proper techniques to use her inhalers.  I will ask for a spacer to be prescribed for her to eliminate the issues that she is having difficulty remembering.   3.  I wrote our for her the difference between the Symbicort being a controller medication to be used every day and Ventolin being used for relief and only being used as needed.  She is going to place the piece of paper on her refrigerator so she can look at it every day.  4.  I will follow up with  her in 3 weeks to see how she is doing with her simvastatin and her Symbicort adherence via a phone call.   [facility]N CM Care Plan Problem One        Most Recent Value   Care Plan Problem One  Medication Adherence   Role Documenting the Problem One  Clinical Pharmacist   Care Plan for Problem One  Active   [facility]N CM Short Term Goal #1 (0-30 days)  [name] [name] will be adherent to her Symbicort at least 70% of the time over the next 30 days evident by pharmacist review of puffs on Symbicort (40 puffs left today)   [facility]N CM Short Term Goal #1 Start Date  06/09/16   [facility] CM Short Term Goal #1 Met Date     Interventions for Short Term Goal #1  I counseled [name] [name] on the appropriate way to use her Symbicort again.  She was able to demostrate proper technique.  I wrote down the difference between a controller medication and a relief medication.  I wrote down that she needs to take Symbicort twice a day. I      Deanne Coffer, PharmD, [place] [phone]

## 2016-06-20 ENCOUNTER — Other Ambulatory Visit: Payer: Self-pay | Admitting: Licensed Clinical Social Worker

## 2016-06-20 NOTE — Patient Outreach (Signed)
[facility]) Care Management  06/20/2016  Jimmy FootmanRobin L Acocella [birth year] [id number]   Assessment- CSW completed outreach to patient and patient answered. Patient reports that she is doing well. She is agreeable to a home viist for 06/21/16.  Plan-CSW will complete home visit on 06/21/16.  Dickie LaBrooke Jarom Govan, BSW, MSW, LCSW [facility] Care Management [email or web address] Phone: [phone] Fax: [phone]

## 2016-06-21 ENCOUNTER — Other Ambulatory Visit: Payer: Self-pay | Admitting: Licensed Clinical Social Worker

## 2016-06-21 DIAGNOSIS — I10 Essential (primary) hypertension: Secondary | ICD-10-CM

## 2016-06-21 NOTE — Patient Outreach (Signed)
[facility]) Care Management  [facility] Social Work  06/21/2016  [name] [name] [birth year] [id number]  Encounter Medications:  Outpatient Encounter Prescriptions as of 06/21/2016  Medication Sig Note  . albuterol (PROVENTIL HFA;VENTOLIN HFA) 108 (90 Base) MCG/ACT inhaler Inhale 2 puffs into the lungs every 4 (four) hours as needed for wheezing or shortness of breath. 05/12/2016: Filled on 03/29/16 but has not had to use it.  Had 200 puffs left on the MDI  . budesonide-formoterol (SYMBICORT) 160-4.5 MCG/ACT inhaler Inhale 2 puffs into the lungs 2 (two) times daily. (Patient not taking: Reported on 06/09/2016) 06/09/2016: Has 42 puffs left of MD1 on 06/09/16  . naproxen sodium (ALEVE) 220 MG tablet Take 440 mg by mouth 2 (two) times daily as needed (pain). Reported on 05/12/2016   . simvastatin (ZOCOR) 20 MG tablet Take 1 tablet (20 mg total) by mouth at bedtime.    No facility-administered encounter medications on file as of 06/21/2016.    Functional Status:  In your present state of health, do you have any difficulty performing the following activities: 06/21/2016 04/28/2016  Hearing? N N  Vision? Y Y  Difficulty concentrating or making decisions? Y N  Walking or climbing stairs? N N  Dressing or bathing? N N  Doing errands, shopping? Y N  Preparing Food and eating ? - N  Using the Toilet? - N  In the past six months, have you accidently leaked urine? - N  Do you have problems with loss of bowel control? - N  Managing your Medications? - N  Managing your Finances? - N  Housekeeping or managing your Housekeeping? - N    Fall/Depression Screening:  PHQ 2/9 Scores 06/21/2016 04/28/2016 04/19/2016 04/14/2016 12/16/2014  PHQ - 2 Score 0 0 0 0 0    Assessment: CSW complete home visit on 06/21/16 and patient's sister was present during visit. Patient reports that she has been taking all of her medications every day as prescribed and how not been missing them on the weekends like she has  in the past. Patient reports that she is not using SCAT at this time but has been walking to most places. Patient denies any pain. Patient denies any shortness of breath. Patient denies any depressive symptoms. CSW educated her again on payee representative options if she wished to change the arrangements that have been made. Patient expressed understanding. CSW reviewed upcoming PCP appointment in October of 2017. Patient does no see a psychiatrist but CSW strongly encouraged her to consider this. CSW asked if she has implemented any socialization and she reports that she continues to talk to her close friend 2-3 times per week and goes out of the house to get food. Patient receives $754.00 per month and receives $16.00 with food stamps. CSW would like to see if she could gain additional food stamps and will help her apply next month during a home visit. Patient does not have scales or a blood pressure monitor. CSW ordered these for her through [facility]. They will be delivered directly to her home. Patient and sister report confusion over how to read a nutrition label. [facility] RNCM reviewed this with her and provided her with a chart but family is still confused which may be due to their cognitive issues. CSW spent time educating family on the difference food groups for low salt and high salt. Patient continues to eat fast food frequently and does not know which foods are health for her. CSW will make new referral for  RNCM so that re education can take place and directions on how to use blood pressure cuff.  CSW reviewed food pantry resources (she still has not used this resource), financial resources and senior resources within the community.  THN CM Care Plan Problem One        Most Recent Value   Care Plan Problem One  Lack of community resources in order to improve health and well-being   Role Documenting the Problem One  Clinical Social Worker   THN CM Short Term Goal #1 (0-30 days)  Patient will be able to  verbalize where to get a free meal daily and how to go to a food pantry in 30 days   THN CM Short Term Goal #1 Start Date  06/21/16   Interventions for Short Term Goal #1  CSW will provide education during home visit on available resources for food.   THN CM Short Term Goal #3 (0-30 days)  Patient will be able to read back community resource information within 30 days   THN CM Short Term Goal #3 Start Date  05/17/16   [facility] CM Short Term Goal #3 Met Date  06/21/16   Interventions for Short Tern Goal #3  CSW has completed review of community resources two times and patient will be able to [employer] information within 30 days with assistance of CSW if needed.      Plan: CSW will complete home visit next month and assist with food stamps application.  Eula Fried, BSW, MSW, [email or web address] Phone: [phone] Fax: [phone]

## 2016-06-22 ENCOUNTER — Other Ambulatory Visit: Payer: Self-pay | Admitting: Licensed Clinical Social Worker

## 2016-06-22 NOTE — Patient Outreach (Signed)
[facility](THN) Care Management  06/22/2016  [name] FootmanRobin L [name] [birth year] [id number]   Assessment- CSW received incoming call from patient. Patient questions if CSW can complete food stamps application for her close friend. CSW informed her that she can only work with herself because that she isher patient but that this CSW can gladly give her an additional food stamps application next month for her to give to whoever she would like.  Plan-CSW will follow up with patient in one month.  Dickie LaBrooke Andri Prestia, BSW, MSW, LCSW [facility] Care Management [email or web address] Phone: [phone] Fax: [phone]

## 2016-06-23 ENCOUNTER — Other Ambulatory Visit: Payer: Self-pay

## 2016-06-23 ENCOUNTER — Ambulatory Visit: Payer: Self-pay

## 2016-06-23 NOTE — Patient Outreach (Signed)
[facility] Care Management [facility] CM Telephone Outreach  06/23/2016  Jimmy FootmanRobin L Levinson 12-21-1956 [id number]  Initial phone call made to patient at home number [phone]. Provided education regarding privacy and verified patient identity via DOB and address.   Advised patient that RNCM had received referral from SW regarding diet education. Patient stated that she would like more education. Agreeable to a home visit next week. No other concerns or problems voiced at this time.  Plan: Will see patient for home visit next week to follow up and provide additional diet education.  TurkeyVictoria R. Nyaisha Simao, RN, BSN, CCM [facility] Care Management Coordinator [phone]

## 2016-06-30 ENCOUNTER — Ambulatory Visit: Payer: Self-pay

## 2016-06-30 ENCOUNTER — Other Ambulatory Visit: Payer: Self-pay

## 2016-07-07 DIAGNOSIS — L245 Irritant contact dermatitis due to other chemical products: Secondary | ICD-10-CM | POA: Diagnosis not present

## 2016-07-12 ENCOUNTER — Other Ambulatory Visit: Payer: Self-pay

## 2016-07-13 NOTE — Patient Outreach (Signed)
[facility](THN) Care Management  07/12/2016  Jimmy FootmanRobin L Oertel February 09, 1956 [id number]   Home visit completed with patient to follow up on healthy diet education. Patient reports that she did not fill out the food journal. Stated that she realized that she has basically only been eating sandwiches for past couple weeks. Stated that she only has a cup of coffee for breakfast and eats a sandwich for lunch and dinner. Patient reports that she is either eating a peanut butter or a salami sandwich because that is what she found on sale. Patient stated that she did not increase her water intake because she has not had the chance to buy any new bottled water. Patient declines to drink her tap water, stating it does not taste the same. Encouraged to drink water with the flavor packets to enhance the flavor. Patient reports she has also not been eating vegetables.   Patient reported that she [facility] if her medication was getting low. She stated that she will not be able to get refills until she gets paid on 07/28/2016. Stated that she does not have the money right now to get her cholesterol medication filled. Patient reports that she has been taking her cholesterol medication every night.  She currently has 17 pills left in her box. Advised patient that she should have enough to last until she gets paid, encouraged to call in refill prior to pay day so she can pick them up once she is paid. Patient verbalized understanding.   While reviewing Zocor, assessed patient's inhalers as well. Patient reports that she is taking her Symbicort only when she needs it, "when she feel short of breath or wheezing." Patient still has 40 puffs remaining on Symbicort inhaler. Educated patient regarding use of Symbicort and importance of using it every day as prescribed and she verbalized understanding. Patient's albuterol inhaler was still in the box and she had not opened it yet. Stated she  "has not had to open it yet." Educated regarding rescue inhaler medication. Patient verbalizes understanding that she is to take the Symbicort inhaler every day and will start using it today and will use the rescue inhaler when she feels short of breath.  Patient reported that she has not received the blood pressure cuff. Stated that she has not been weighing herself with hew new scale. Stated that she has not had the chance to get batteries for it. Encouraged patient to get scale out so that [facility] up. Batteries were included with scale and RNCM inserted them into the scale for patient. Patient weighed 106 lbs today. Provided education about obtaining accurate weight - encouraged not to use scale on carpet. Verbalized understanding. Patient has no other concerns at present. Advised will follow up within next couple of weeks to determine if she has received her blood pressure cuff and will provide education about how to check her blood pressure once she has received it. Patient agreeable.   TurkeyVictoria R. Dione Mccombie, RN, BSN, CCM [facility] Care Management Coordinator [phone]

## 2016-07-13 NOTE — Patient Outreach (Signed)
[facility](THN) Care Management  Late entry for 06/30/2016  Jimmy FootmanRobin L [name] [birth year] [id number]  Home visit completed with patient and her sister, [name] [name].  Patient currently has no complaints or concerns.  Patient stated that she and her sister have been trying to eat heathier and have been trying to cut back their sugar and salt in their diet. Educated patient and her sister regarding reading food labels and how to look at carbohydrates and sodium. Assisted patient with looking at nutrition labels for a variety of foods she had on hand in her kitchen. Educated regarding avoiding foods high in sodium. Patient reports that she eats at fast food restaurants frequently. Provided education regarding lower sodium food choices while eating out in fast food restaurants. Encouraged to eat more fresh fruits and vegetables and less packaged or processed foods. Discussed how to obtain more healthy foods on a budget. Educated patient regarding lower-priced produce at [employer] grocery store. Patient currently does not eat many vegetables or fruits. Patient also reports that she does not drink a lot of water. Patient is very active and walking is her primary mode of transportation. Patient also mows a friend's yard. Educated patient on importance of staying hydrated, particularly in the summer heat. Patient agreeable to try to drink more water.  Patient wants to implement changes to diet that are healthier. Over the next 2 weeks: Patient stated she would try to drink 2-3 (16 ounce) bottle of water per day. Patient stated she would try to eat at least one serving of fruits or vegetables per day. Patient stated she would buy 2% milk at next purchase instead of whole milk. Patient given a one week food journal and encouraged to fill in what she eats for each meal daily. Educated patient this will help us to see what her current food habits are so we can talk about what she could change to eat healthier.  Patient agreeable to filling out journal and will evaluate at next home visit. Patient stated that SW ordered her a set of scales and a blood pressure cuff. Stated that she had received the scales today, but has not opened them. Stated she has not yet received the blood pressure cuff.  Plan: Follow up in 2 weeks to determine if patient made changes to diet as above. Follow up regarding BP cuff and educate patient on monitoring of BP. Review patient food journal.  Fanny DanceVictoria R. Shamond Skelton, RN, BSN, CCM [facility] Care Management Coordinator [phone]

## 2016-07-18 ENCOUNTER — Other Ambulatory Visit: Payer: Self-pay | Admitting: Licensed Clinical Social Worker

## 2016-07-18 NOTE — Patient Outreach (Signed)
[facility]) Care Management  07/18/2016  [name] [name] 07-02-1956 [id number]   Assessment- CSW completed outreach call to patient. Patient answered. Patient reports that she is doing well. She reports that she received scales. Patient shares that she has not received blood pressure monitor. CSW informed her that she will bring blood pressure monitor to her during home visit. CSW questioned if we could complete home visit tomorrow instead of Thursday due to conflicting schedule. Patient agreeable to this. Patient wishes for CSW to assist her with completing food stamps application.  Plan-CSW will complete home visit tomorrow.  Dickie La, BSW, MSW, LCSW [email or web address] Phone: [phone] Fax: [phone]

## 2016-07-19 ENCOUNTER — Other Ambulatory Visit: Payer: Self-pay | Admitting: Licensed Clinical Social Worker

## 2016-07-19 NOTE — Patient Outreach (Signed)
[facility]) Care Management  [facility] Social Work  07/19/2016  MALITA IGNASIAK 03/01/1956 [id number]  Subjective:    Objective:   Encounter Medications:  Outpatient Encounter Prescriptions as of 07/19/2016  Medication Sig Note  . albuterol (PROVENTIL HFA;VENTOLIN HFA) 108 (90 Base) MCG/ACT inhaler Inhale 2 puffs into the lungs every 4 (four) hours as needed for wheezing or shortness of breath. 05/12/2016: Filled on 03/29/16 but has not had to use it.  Had 200 puffs left on the MDI  . budesonide-formoterol (SYMBICORT) 160-4.5 MCG/ACT inhaler Inhale 2 puffs into the lungs 2 (two) times daily. (Patient not taking: Reported on 06/09/2016) 06/09/2016: Has 42 puffs left of MD1 on 06/09/16  . naproxen sodium (ALEVE) 220 MG tablet Take 440 mg by mouth 2 (two) times daily as needed (pain). Reported on 05/12/2016   . simvastatin (ZOCOR) 20 MG tablet Take 1 tablet (20 mg total) by mouth at bedtime.    No facility-administered encounter medications on file as of 07/19/2016.     Functional Status:  In your present state of health, do you have any difficulty performing the following activities: 06/21/2016 04/28/2016  Hearing? N N  Vision? Y Y  Difficulty concentrating or making decisions? Y N  Walking or climbing stairs? N N  Dressing or bathing? N N  Doing errands, shopping? Y N  Preparing Food and eating ? - N  Using the Toilet? - N  In the past six months, have you accidently leaked urine? - N  Do you have problems with loss of bowel control? - N  Managing your Medications? - N  Managing your Finances? - N  Housekeeping or managing your Housekeeping? - N  Some recent data might be hidden    Fall/Depression Screening:  PHQ 2/9 Scores 07/19/2016 06/21/2016 04/28/2016 04/19/2016 04/14/2016 12/16/2014  PHQ - 2 Score 0 0 0 0 0 0    Assessment: CSW completed home visit today on 07/19/16. Patient, patient's sister and patient's friend were present during visit. Patient gave permission that  they could be present during this visit. Patient is interested in reapplying for food stamps today and mailing the completed application herself in the future. Patient has been unable to pay for food in the last few months. However, there seems to be more food in her refrigerator than last month. Patient is on Medicaid and is on a fixed income. Also, patient continues to have difficulty understanding what is healthy to eat and what is not healthy to eat. CSW provided education. Patient has been spending what money she receives on donuts, fast food and ice cream. CSW encouraged her to eat foods with low sugar and completed review of the food chart that [place] provided. Patient will be given a $25.00 gift card to assist her with affording food for the next month. This gift card is paid for by [employer]. Sister ask if gift card is for them both or just patient and CSW informed them that this gift card is ONLY for the patient and her food needs. Patient will use this to afford groceries. Patient was provided a blood pressure monitor and CSW put batteries in it for her and showed her how to use it. However, patient may need additional education on how to use device by [facility]. Patient is interested in completing food stamps application but instead of CSW faxing it for her she would like to hold on to it and ask family members a few questions before mailing it  in when she is ready to do so.  CSW assisted patient with completing food stamps application as patient is interested in gaining additional food stamps than what she has now. Patient put completed food stamps application somewhere safe.                Plan: CSW will complete next home visit in one month. CSW will continue to provide social work assistance.   THN CM Care Plan Problem One   Flowsheet Row Most Recent Value  THN CM Short Term Goal #1 Start Date  07/19/16  THN CM Short Term Goal #1 Met Date  -- [Not met]  Interventions for Short Term  Goal #1  CSW will provide education during home visit on available resources for food. Patient was unable to meet this goal. CSW will make this   THN CM Short Term Goal #4 (0-30 days)  Patient will gain food within 30 days as evidenced by her financial problems.  THN CM Short Term Goal #4 Met Date  07/19/16  Interventions for Short Term Goal #4  CSW completed home visit and provided patient with a $25.00 gift card to assist her with affording groceries.      Eula Fried, BSW, MSW, [email or web address] Phone: [phone] Fax: [phone]

## 2016-07-21 ENCOUNTER — Ambulatory Visit: Payer: Self-pay | Admitting: Licensed Clinical Social Worker

## 2016-07-29 ENCOUNTER — Other Ambulatory Visit: Payer: Self-pay

## 2016-07-29 NOTE — Patient Outreach (Signed)
[facility]) Care Management  07/29/16  [name] [name] 1956-03-30 [id number]  Successful outreach completed with patient. Patient identification verified.   Patient stated that she is doing ok today, has been really busy. Currently has no complaints.  Patient stated that she received her blood pressure cuff and stated that [place] CSW showed her how to use it. She stated that she knows how to use it, but is not checking her blood pressure routinely. Stated that she just checks it every now and then. She reports she has not used the BP cuff or the scales today and cannot recall what her last readings were when she had did measure them.  Patient stated that she still has a couple of pills left of her cholesterol medicine and has not yet called in a refill. Patient stated she has the money now to get it and will call her prescriptions in today so she can pick them up on Saturday.   Plan: RNCM will complete visit next week to close case. Will assess patient ability to monitor blood pressure and provide additional education as needed.  [name] R. Sarrah Fiorenza, RN, BSN, CCM [facility] Care Management Coordinator [phone]

## 2016-08-04 ENCOUNTER — Other Ambulatory Visit: Payer: Self-pay

## 2016-08-09 NOTE — Patient Outreach (Signed)
[facility] ([facility]) Care Management   08/04/2016  Jimmy FootmanRobin L Gubser [birth year][facility] [id number]  Home visit completed with patient to follow up regarding blood pressure monitoring. Patient reported that she received her blood pressure cuff and that the SW taught her how to use it. She denies using it since receiving it, but verbalizes knowledge of how to use. Manual BP today was 100/66. Patient has no swelling, no chest pain, no shortness of breath and denies headaches. Patient is very active in the community and walks everywhere as her primary method of transportation.   Patient no longer has questions/concerns about her diet. Stated that she is still eating sandwiches. Patient has not increased her water intake due to not buying any recently and she does not like drinking tap water. Encouraged to use flavored drops to help her increase water intake.  Patient has still not filled her cholesterol medication. Patient stated that she did not have the money to get it filled, but plans to call it in today. On 08/04/2016, she had 9 tablets of simvastatin left. Patient stated she has been taking every night, but she had 17 tablets on 07/12/2016.  She reports that she has been using her inhaler when she felt short of breath. Reports she used it after being outside a few days ago. Patient reports she uses the Symbicort as needed, despite multiple attempts to educate patient to use this daily. RNCM provided additional education to use Symbicort daily and patient verbalizes understanding. She reports she used 2 puffs of it, however, on this date she has 40 puffs remaining (which is the same that she had on 7/18).   Patient has no other nursing needs. Will send updates to pharmacist to let her know patient continues to struggle with medication adherence.   Patient encouraged to use 24 hour nurse line or contact RNCM if she has any other questions or concerns that arise and patient is agreeable.   TurkeyVictoria R.  Gabrille Kilbride, RN, BSN, CCM [facility] Care Management Coordinator [phone]

## 2016-08-10 ENCOUNTER — Other Ambulatory Visit: Payer: Self-pay | Admitting: Licensed Clinical Social Worker

## 2016-08-10 NOTE — Patient Outreach (Signed)
[facility](THN) Care Management  08/10/2016  [name] FootmanRobin L [name] 1956/01/23 [id number]   Assessment- CW completed outreach to patient. Patient answered. Patient reports that she continues to struggle with remembering to check her blood pressure. Patient shares that she has been weighing herself but states "It always stays the same. It doesn't change" regarding her weight at 105 pounds. Patient is still agreeable to home visit in two weeks. CSW completed resource review for food and financial in [name].  Plan-CSW will complete home visit on 08/25/16.  [name] [name], BSW, MSW, LCSW [facility] Care Management [name] Phone: [phone] Fax: [phone]

## 2016-08-18 ENCOUNTER — Other Ambulatory Visit: Payer: Self-pay | Admitting: Licensed Clinical Social Worker

## 2016-08-18 NOTE — Patient Outreach (Signed)
[facility](THN) Care Management  08/18/2016  [name] FootmanRobin L [name] [birth year] [id number]   Assessment- CSW completed outreach to patient and reminded her of upcoming home visit next week. Patient answered and expressed understanding. She remains agreeable to home visit on 08/25/16.  Plan-CSW will complete home visit next week.  Dickie LaBrooke Derrika Ruffalo, BSW, MSW, LCSW [facility] Care Management [email or web address] Phone: [phone] Fax: [phone]

## 2016-08-25 ENCOUNTER — Other Ambulatory Visit: Payer: Self-pay | Admitting: Licensed Clinical Social Worker

## 2016-08-25 ENCOUNTER — Encounter: Payer: Self-pay | Admitting: Licensed Clinical Social Worker

## 2016-08-25 NOTE — Patient Outreach (Signed)
[facility]) Care Management  [facility] Social Work  08/25/2016  [name] [name] 11-Aug-1956 [id number]  Encounter Medications:  Outpatient Encounter Prescriptions as of 08/25/2016  Medication Sig Note  . albuterol (PROVENTIL HFA;VENTOLIN HFA) 108 (90 Base) MCG/ACT inhaler Inhale 2 puffs into the lungs every 4 (four) hours as needed for wheezing or shortness of breath. 05/12/2016: Filled on 03/29/16 but has not had to use it.  Had 200 puffs left on the MDI  . budesonide-formoterol (SYMBICORT) 160-4.5 MCG/ACT inhaler Inhale 2 puffs into the lungs 2 (two) times daily. (Patient not taking: Reported on 06/09/2016) 06/09/2016: Has 42 puffs left of MD1 on 06/09/16  . naproxen sodium (ALEVE) 220 MG tablet Take 440 mg by mouth 2 (two) times daily as needed (pain). Reported on 05/12/2016   . simvastatin (ZOCOR) 20 MG tablet Take 1 tablet (20 mg total) by mouth at bedtime.    No facility-administered encounter medications on file as of 08/25/2016.     Functional Status:  In your present state of health, do you have any difficulty performing the following activities: 06/21/2016 04/28/2016  Hearing? N N  Vision? Y Y  Difficulty concentrating or making decisions? Y N  Walking or climbing stairs? N N  Dressing or bathing? N N  Doing errands, shopping? Y N  Preparing Food and eating ? - N  Using the Toilet? - N  In the past six months, have you accidently leaked urine? - N  Do you have problems with loss of bowel control? - N  Managing your Medications? - N  Managing your Finances? - N  Housekeeping or managing your Housekeeping? - N  Some recent data might be hidden    Fall/Depression Screening:  PHQ 2/9 Scores 08/25/2016 07/19/2016 06/21/2016 04/28/2016 04/19/2016 04/14/2016 12/16/2014  PHQ - 2 Score 0 0 0 0 0 0 0    Assessment: CSW completed home visit on 08/25/16 with patient and patient's sister present. Patient was provided a $50.00 gift card through [employer] and used it to pay  for food and drinks. Patient still has food Fire[employer] and is able to verbalize food resources within [facility]. CSW provided education on resources again. PatPient states that she has improved with remembering to take her medications. Patient reports that she has not been weighing herself but tries to remember to do so. Patient admits that she has not used her blood pressure monitor. CSW provided education on how to use machine again. Patient has not used [employer] yet and continues to use the regular [employer] bus. Patient states that she was not sure if [employer] would transport her to certain areas. CSW informed her that [employer] can go anywhere in [facility]. Patient informed this CSW that her sister was denied [employer]. However, patient can have someone travel with her at all times while using [employer]. CSW provided instructions on how to schedule [employer] transportation. CSW went through these steps with both patient and sister and wrote them down for patient to keep on hand. CSW contacted [employer] reservation line and demonstrated how to make an appointment. Patient reports that she continues to have difficulty remembering what foods are healthy and what foods are not. CSW provided education on what healthy eating looks like. Patient admits to eating junk food but did make the effort to cook a meal that included: baked chicken, squash and salad. CSW appraised this effort of implementation. CSW reviewed all [facility] resources that have been provided to her by this [facility]  CSW since April. Patient has kept all resources in her [facility] folder. Patient reports that she has been experiencing some back pain but is not experiencing today. Patient denies any further social work needs at this time. All goals met. Patient is agreeable to [facility] case closure. [facility] RNCM is not longer involved.   Plan: CSW will complete case closure at this time. CSW will notify PCP and [facility] Management Assistant.  Eula Fried, BSW, MSW, [email or web address] Phone: [phone] Fax: [phone]

## 2016-09-07 ENCOUNTER — Other Ambulatory Visit: Payer: Self-pay | Admitting: Pharmacist

## 2016-09-07 NOTE — Patient Outreach (Addendum)
[facility]) Care Management  [facility] Pharmacy   09/07/2016  MARCI POLITO Oct 31, 1956 [id number]  Subjective: 60 year old female referred to me for medication adherence.  She has previously been seen by [facility] pharmacy in May and June of 2017 for medication adherence with extensive education on inhaler technique and education on differences between as needed and long acting controller medications. Today she states the understands that her albuterol is for as needed and her Symbicort is long acting but she states that she "really doesn't need except when she is working outside." She denies side effects with her medications. Reports more shortness of breath when working outside but not inside and only uses her Symbicort a couple of days a week when she has shortness of breath.    She states she takes simvastatin every night and denies missed doses.  Per [facility] nurse she had missed several doses of her simvastatin based on refill dates and tablets remaining.   Objective:   Encounter Medications: Outpatient Encounter Prescriptions as of 09/07/2016  Medication Sig Note  . albuterol (PROVENTIL HFA;VENTOLIN HFA) 108 (90 Base) MCG/ACT inhaler Inhale 2 puffs into the lungs every 4 (four) hours as needed for wheezing or shortness of breath. 05/12/2016: Filled on 03/29/16 but has not had to use it.  Had 200 puffs left on the MDI  . budesonide-formoterol (SYMBICORT) 160-4.5 MCG/ACT inhaler Inhale 2 puffs into the lungs 2 (two) times daily. (Patient taking differently: Inhale 2 puffs into the lungs 2 (two) times daily as needed. )   . naproxen sodium (ALEVE) 220 MG tablet Take 440 mg by mouth 2 (two) times daily as needed (pain). Reported on 05/12/2016   . simvastatin (ZOCOR) 20 MG tablet Take 1 tablet (20 mg total) by mouth at bedtime.    No facility-administered encounter medications on file as of 09/07/2016.     Functional Status: In your present state of health, do you have any difficulty  performing the following activities: 06/21/2016 04/28/2016  Hearing? N N  Vision? Y Y  Difficulty concentrating or making decisions? Y N  Walking or climbing stairs? N N  Dressing or bathing? N N  Doing errands, shopping? Y N  Preparing Food and eating ? - N  Using the Toilet? - N  In the past six months, have you accidently leaked urine? - N  Do you have problems with loss of bowel control? - N  Managing your Medications? - N  Managing your Finances? - N  Housekeeping or managing your Housekeeping? - N  Some recent data might be hidden    Fall/Depression Screening: PHQ 2/9 Scores 08/25/2016 07/19/2016 06/21/2016 04/28/2016 04/19/2016 04/14/2016 12/16/2014  PHQ - 2 Score 0 0 0 0 0 0 0    Assessment:  Drugs sorted by system: Cardiovascular: simvastatin 20 mg every night  Pulmonary/Allergy: Symbicort 160/4.5 mcg 2 puffs twice daily (Patient currently only using as needed), albuterol as needed  Pain: naproxen 440 mg twice daily as needed   Plan: -Counseled on importance of adherence to her medications -Discussed difference between short acting/as needed inhaled medications and her long acting/controller medications.  Discussed the need to make sure she takes her Symbicort 2 puffs twice daily consistently to prevent asthma symptoms from occurring.  -Patient has set goal to take her Symbicort twice daily 75% of the time over the next week. -Will followup via telephone in 1 week to assess adherence and need for potential home visit.  Hazle Nordmann, PharmD [facility] PGY2  Pharmacy Resident [phone]   [facility] Problem One   Flowsheet Row Most Recent Value  Care Plan Problem One  Medication Adherence  Role Documenting the Problem One  Clinical Pharmacist  Care Plan for Problem One  Active  [facility] CM Short Term Goal #1 (0-30 days)  Patient will be 75% adherent to taking her Symbicort twice daily as measured by patient report over the next 7 days  [facility] CM Short Term Goal #1 Start Date   09/07/16  Interventions for Short Term Goal #1  Counseled on importance of adherence to her medications.  Discussed difference between short acting/as needed inhaled medications and her long acting/controller medications.  Discussed the need to make sure she takes her Symbicort 2 puffs twice daily consistently to prevent asthma symptoms from occurring.

## 2016-09-14 ENCOUNTER — Ambulatory Visit: Payer: Self-pay | Admitting: Pharmacist

## 2016-09-16 ENCOUNTER — Ambulatory Visit: Payer: Self-pay | Admitting: Pharmacist

## 2016-09-19 ENCOUNTER — Ambulatory Visit: Payer: Self-pay | Admitting: Pharmacist

## 2016-09-19 ENCOUNTER — Other Ambulatory Visit: Payer: Self-pay | Admitting: Pharmacist

## 2016-09-19 NOTE — Patient Outreach (Signed)
[facility]) Care Management  09/19/2016  [name] FootmanRobin L [name] 30-Jan-1956 [id number]   60 year old female referred to me for medication adherence.  Called patients home phone and spoke with [name] who stated patient is not currently home.  Left telephone number and message for patient to return call.  Plan: Followup telephone call in 2-3 days to access for medication adherence and need for potential home visit   Hazle NordmannKelsy Combs, PharmD [facility] PGY2 Pharmacy Resident [phone]

## 2016-09-23 ENCOUNTER — Other Ambulatory Visit: Payer: Self-pay | Admitting: Pharmacist

## 2016-09-23 NOTE — Patient Outreach (Addendum)
[facility]) Care Management  09/23/2016  Jimmy FootmanRobin L Kovalcik January 21, 1956 [id number]   60 year old female referred to me for medication adherence.  [facility] with extensive education on inhaler technique and education on when to use long acting vs short acting inhalers. We had discussed during the last telephone call that she would try to use her Symbicort twice daily and continue to take her Simvastatin.  Today patient states that "she does not need the Symbicort everyday and is not going to take it everyday but she has been taking her simvastatin every night at bedtime."  Following this statement we were either disconnected or she hung up the phone.    Medication Adherence: Patient is non-compliant with her Symbicort and only uses Symbicort and Albuterol as needed due to patient preference and only having occasional symptoms.  Patient has had extensive education that the Symbicort can help prevent asthma symptoms and exacerbations but still refuses to take the Symbicort on a scheduled basis.  Patient reports adherence with her simvastatin.   Plan: Counseled on importance of adherence to her medications as prescribed to prevent cardiovascular and pulmonary events [facility] will close case as patient has continually refused to take medications as prescribed - will notify Dr Okey Duprerawford and [facility] Assistant Will notify patients primary care physician and recommend obtaining repeat LDL at next visit to assess compliance/need for high intensity statin.     Hazle NordmannKelsy Tashai Catino, PharmD [facility] PGY2 Pharmacy Resident [phone]   [facility] CM Care Plan Problem One   Flowsheet Row Most Recent Value  Care Plan Problem One  Medication Adherence  Role Documenting the Problem One  Clinical Pharmacist  Care Plan for Problem One  Active  [facility] CM Short Term Goal #1 (0-30 days)  Patient will be 75% adherent to taking her Symbicort twice daily as  measured by patient report over the next 7 days  [facility] CM Short Term Goal #1 Start Date  09/07/16  Interventions for Short Term Goal #1  Counseled on importance of adherence to her medications.  Discussed difference between short acting/as needed inhaled medications and her long acting/controller medications.  Discussed the need to make sure she takes her Symbicort 2 puffs twice daily consistently to prevent asthma symptoms from occurring.

## 2016-10-14 ENCOUNTER — Encounter: Payer: Commercial Managed Care - HMO | Admitting: Internal Medicine

## 2016-11-09 ENCOUNTER — Other Ambulatory Visit (INDEPENDENT_AMBULATORY_CARE_PROVIDER_SITE_OTHER): Payer: Commercial Managed Care - HMO

## 2016-11-09 ENCOUNTER — Encounter: Payer: Self-pay | Admitting: Internal Medicine

## 2016-11-09 ENCOUNTER — Ambulatory Visit (INDEPENDENT_AMBULATORY_CARE_PROVIDER_SITE_OTHER): Payer: Commercial Managed Care - HMO | Admitting: Internal Medicine

## 2016-11-09 VITALS — BP 128/78 | HR 68 | Temp 97.3°F | Resp 14 | Ht 61.0 in | Wt 99.0 lb

## 2016-11-09 DIAGNOSIS — D509 Iron deficiency anemia, unspecified: Secondary | ICD-10-CM | POA: Diagnosis not present

## 2016-11-09 DIAGNOSIS — Z0000 Encounter for general adult medical examination without abnormal findings: Secondary | ICD-10-CM

## 2016-11-09 DIAGNOSIS — Z1159 Encounter for screening for other viral diseases: Secondary | ICD-10-CM | POA: Diagnosis not present

## 2016-11-09 DIAGNOSIS — J4520 Mild intermittent asthma, uncomplicated: Secondary | ICD-10-CM

## 2016-11-09 LAB — CBC
HCT: 35.6 % — ABNORMAL LOW (ref 36.0–46.0)
Hemoglobin: 12.2 g/dL (ref 12.0–15.0)
MCHC: 34.2 g/dL (ref 30.0–36.0)
MCV: 89.5 fl (ref 78.0–100.0)
PLATELETS: 256 10*3/uL (ref 150.0–400.0)
RBC: 3.98 Mil/uL (ref 3.87–5.11)
RDW: 13.4 % (ref 11.5–15.5)
WBC: 4.6 10*3/uL (ref 4.0–10.5)

## 2016-11-09 LAB — COMPREHENSIVE METABOLIC PANEL
ALBUMIN: 4.6 g/dL (ref 3.5–5.2)
ALK PHOS: 72 U/L (ref 39–117)
ALT: 16 U/L (ref 0–35)
AST: 19 U/L (ref 0–37)
BILIRUBIN TOTAL: 0.7 mg/dL (ref 0.2–1.2)
BUN: 14 mg/dL (ref 6–23)
CALCIUM: 10 mg/dL (ref 8.4–10.5)
CO2: 29 mEq/L (ref 19–32)
Chloride: 104 mEq/L (ref 96–112)
Creatinine, Ser: 0.87 mg/dL (ref 0.40–1.20)
GFR: 70.91 mL/min (ref >60.00)
Glucose, Bld: 85 mg/dL (ref 70–99)
Potassium: 4.2 mEq/L (ref 3.5–5.1)
Sodium: 141 mEq/L (ref 135–145)
TOTAL PROTEIN: 7.1 g/dL (ref 6.0–8.3)

## 2016-11-09 LAB — LIPID PANEL
CHOLESTEROL: 233 mg/dL — AB (ref 0–200)
HDL: 104.2 mg/dL (ref >39.00)
LDL Cholesterol: 122 mg/dL — ABNORMAL HIGH (ref 0–99)
NonHDL: 128.81
TRIGLYCERIDES: 35 mg/dL (ref 0.0–149.0)
Total CHOL/HDL Ratio: 2
VLDL: 7 mg/dL (ref 0.0–40.0)

## 2016-11-09 NOTE — Progress Notes (Signed)
   Subjective:    Patient ID: [name] [name], female    DOB: Jan 07, 1956, 60 y.o.   MRN: [id number]  HPI Here for medicare wellness, no new complaints. Please see A/P for status and treatment of chronic medical problems.   Diet: heart healthy Physical activity: sedentary Depression/mood screen: negative Hearing: intact to whispered voice Visual acuity: grossly normal, performs annual eye exam  ADLs: capable Fall risk: none Home safety: good Cognitive evaluation: intact to orientation, naming, recall and repetition EOL planning: adv directives discussed not in place  I have personally reviewed and have noted 1. The patient's medical and social history - reviewed today no changes 2. Their use of alcohol, tobacco or illicit drugs 3. Their current medications and supplements 4. The patient's functional ability including ADL's, fall risks, home safety risks and hearing or visual impairment. 5. Diet and physical activities 6. Evidence for depression or mood disorders 7. Care team reviewed and updated (available in snapshot)  Review of Systems  Constitutional: Negative.   HENT: Negative.   Eyes: Negative.   Respiratory: Negative for cough, chest tightness and shortness of breath.   Cardiovascular: Negative for chest pain, palpitations and leg swelling.  Gastrointestinal: Negative for abdominal distention, abdominal pain, constipation, diarrhea, nausea and vomiting.  Musculoskeletal: Negative.   Skin: Negative.   Neurological: Negative.   Psychiatric/Behavioral: Negative.       Objective:   Physical Exam  Constitutional: She is oriented to person, place, and time. She appears well-developed and well-nourished.  HENT:  Head: Normocephalic and atraumatic.  Eyes: EOM are normal.  Neck: Normal range of motion.  Cardiovascular: Normal rate and regular rhythm.   Pulmonary/Chest: Effort normal and breath sounds normal. No respiratory distress. She has no wheezes. She has no rales.    Abdominal: Soft. Bowel sounds are normal. She exhibits no distension. There is no tenderness. There is no rebound.  Musculoskeletal: She exhibits no edema.  Neurological: She is alert and oriented to person, place, and time. Coordination normal.  Skin: Skin is warm and dry.  Psychiatric: She has a normal mood and affect.   Vitals:   11/09/16 1100  BP: 128/78  Pulse: 68  Resp: 14  Temp: 97.3 F (36.3 C)  TempSrc: Oral  SpO2: 96%  Weight: 99 lb (44.9 kg)  Height: 5\' 1"  (1.549 m)      Assessment & Plan:

## 2016-11-09 NOTE — Patient Instructions (Addendum)
We are checking the labs today and will call you back with the results.   We have put in for the mammogram and you can call to get it done. [address], [place], [place] [address] Phone: [phone]  If you find the records for the colonoscopy send [place] a copy.   Keep up the good work with the health.   Health Maintenance, Female Introduction Adopting a healthy lifestyle and getting preventive care can go a long way to promote health and wellness. Talk with your health care provider about what schedule of regular examinations is right for you. This is a good chance for you to check in with your provider about disease prevention and staying healthy. In between checkups, there are plenty of things you can do on your own. Experts have done a lot of research about which lifestyle changes and preventive measures are most likely to keep you healthy. Ask your health care provider for more information. Weight and diet Eat a healthy diet  Be sure to include plenty of vegetables, fruits, low-fat dairy products, and lean protein.  Do not eat a lot of foods high in solid fats, added sugars, or salt.  Get regular exercise. This is one of the most important things you can do for your health.  Most adults should exercise for at least 150 minutes each week. The exercise should increase your heart rate and make you sweat (moderate-intensity exercise).  Most adults should also do strengthening exercises at least twice a week. This is in addition to the moderate-intensity exercise. Maintain a healthy weight  Body mass index (BMI) is a measurement that can be used to identify possible weight problems. It estimates body fat based on height and weight. Your health care provider can help determine your BMI and help you achieve or maintain a healthy weight.  For females 66 years of age and older:  A BMI below 18.5 is considered underweight.  A BMI of 18.5 to 24.9 is normal.  A BMI of 25 to 29.9 is  considered overweight.  A BMI of 30 and above is considered obese. Watch levels of cholesterol and blood lipids  You should start having your blood tested for lipids and cholesterol at 58 years of age, then have this test every 5 years.  You may need to have your cholesterol levels checked more often if:  Your lipid or cholesterol levels are high.  You are older than 58 years of age.  You are at high risk for heart disease. Cancer screening Lung Cancer  Lung cancer screening is recommended for adults 33-38 years old who are at high risk for lung cancer because of a history of smoking.  A yearly low-dose CT scan of the lungs is recommended for people who:  Currently smoke.  Have quit within the past 15 years.  Have at least a 30-pack-year history of smoking. A pack year is smoking an average of one pack of cigarettes a day for 1 year.  Yearly screening should continue until it has been 15 years since you quit.  Yearly screening should stop if you develop a health problem that would prevent you from having lung cancer treatment. Breast Cancer  Practice breast self-awareness. This means understanding how your breasts normally appear and feel.  It also means doing regular breast self-exams. Let your health care provider know about any changes, no matter how small.  If you are in your 20s or 30s, you should have a clinical breast  exam (CBE) by a health care provider every 1-3 years as part of a regular health exam.  If you are 21 or older, have a CBE every year. Also consider having a breast X-ray (mammogram) every year.  If you have a family history of breast cancer, talk to your health care provider about genetic screening.  If you are at high risk for breast cancer, talk to your health care provider about having an MRI and a mammogram every year.  Breast cancer gene (BRCA) assessment is recommended for women who have family members with BRCA-related cancers. BRCA-related  cancers include:  Breast.  Ovarian.  Tubal.  Peritoneal cancers.  Results of the assessment will determine the need for genetic counseling and BRCA1 and BRCA2 testing. Cervical Cancer  Your health care provider may recommend that you be screened regularly for cancer of the pelvic organs (ovaries, uterus, and vagina). This screening involves a pelvic examination, including checking for microscopic changes to the surface of your cervix (Pap test). You may be encouraged to have this screening done every 3 years, beginning at age 58.  For women ages 71-65, health care providers may recommend pelvic exams and Pap testing every 3 years, or they may recommend the Pap and pelvic exam, combined with testing for human papilloma virus (HPV), every 5 years. Some types of HPV increase your risk of cervical cancer. Testing for HPV may also be done on women of any age with unclear Pap test results.  Other health care providers may not recommend any screening for nonpregnant women who are considered low risk for pelvic cancer and who do not have symptoms. Ask your health care provider if a screening pelvic exam is right for you.  If you have had past treatment for cervical cancer or a condition that could lead to cancer, you need Pap tests and screening for cancer for at least 20 years after your treatment. If Pap tests have been discontinued, your risk factors (such as having a new sexual partner) need to be reassessed to determine if screening should resume. Some women have medical problems that increase the chance of getting cervical cancer. In these cases, your health care provider may recommend more frequent screening and Pap tests. Colorectal Cancer  This type of cancer can be detected and often prevented.  Routine colorectal cancer screening usually begins at 58 years of age and continues through 58 years of age.  Your health care provider may recommend screening at an earlier age if you have risk  factors for colon cancer.  Your health care provider may also recommend using home test kits to check for hidden blood in the stool.  A small camera at the end of a tube can be used to examine your colon directly (sigmoidoscopy or colonoscopy). This is done to check for the earliest forms of colorectal cancer.  Routine screening usually begins at age 9.  Direct examination of the colon should be repeated every 5-10 years through 58 years of age. However, you may need to be screened more often if early forms of precancerous polyps or small growths are found. Skin Cancer  Check your skin from head to toe regularly.  Tell your health care provider about any new moles or changes in moles, especially if there is a change in a mole's shape or color.  Also tell your health care provider if you have a mole that is larger than the size of a pencil eraser.  Always use sunscreen. Apply sunscreen liberally and  repeatedly throughout the day.  Protect yourself by wearing long sleeves, pants, a wide-brimmed hat, and sunglasses whenever you are outside. Heart disease, diabetes, and high blood pressure  High blood pressure causes heart disease and increases the risk of stroke. High blood pressure is more likely to develop in:  People who have blood pressure in the high end of the normal range (130-139/85-89 mm Hg).  People who are overweight or obese.  People who are African American.  If you are 18-39 years of age, have your blood pressure checked every 3-5 years. If you are 40 years of age or older, have your blood pressure checked every year. You should have your blood pressure measured twice-once when you are at a hospital or clinic, and once when you are not at a hospital or clinic. Record the average of the two measurements. To check your blood pressure when you are not at a hospital or clinic, you can use:  An automated blood pressure machine at a pharmacy.  A home blood pressure  monitor.  If you are between 55 years and 79 years old, ask your health care provider if you should take aspirin to prevent strokes.  Have regular diabetes screenings. This involves taking a blood sample to check your fasting blood sugar level.  If you are at a normal weight and have a low risk for diabetes, have this test once every three years after 58 years of age.  If you are overweight and have a high risk for diabetes, consider being tested at a younger age or more often. Preventing infection Hepatitis B  If you have a higher risk for hepatitis B, you should be screened for this virus. You are considered at high risk for hepatitis B if:  You were born in a country where hepatitis B is common. Ask your health care provider which countries are considered high risk.  Your parents were born in a high-risk country, and you have not been immunized against hepatitis B (hepatitis B vaccine).  You have HIV or AIDS.  You use needles to inject street drugs.  You live with someone who has hepatitis B.  You have had sex with someone who has hepatitis B.  You get hemodialysis treatment.  You take certain medicines for conditions, including cancer, organ transplantation, and autoimmune conditions. Hepatitis C  Blood testing is recommended for:  Everyone born from 1945 through 1965.  Anyone with known risk factors for hepatitis C. Sexually transmitted infections (STIs)  You should be screened for sexually transmitted infections (STIs) including gonorrhea and chlamydia if:  You are sexually active and are younger than 58 years of age.  You are older than 58 years of age and your health care provider tells you that you are at risk for this type of infection.  Your sexual activity has changed since you were last screened and you are at an increased risk for chlamydia or gonorrhea. Ask your health care provider if you are at risk.  If you do not have HIV, but are at risk, it may be  recommended that you take a prescription medicine daily to prevent HIV infection. This is called pre-exposure prophylaxis (PrEP). You are considered at risk if:  You are sexually active and do not regularly use condoms or know the HIV status of your partner(s).  You take drugs by injection.  You are sexually active with a partner who has HIV. Talk with your health care provider about whether you are at high risk   of being infected with HIV. If you choose to begin PrEP, you should first be tested for HIV. You should then be tested every 3 months for as long as you are taking PrEP. Pregnancy  If you are premenopausal and you may become pregnant, ask your health care provider about preconception counseling.  If you may become pregnant, take 400 to 800 micrograms (mcg) of folic acid every day.  If you want to prevent pregnancy, talk to your health care provider about birth control (contraception). Osteoporosis and menopause  Osteoporosis is a disease in which the bones lose minerals and strength with aging. This can result in serious bone fractures. Your risk for osteoporosis can be identified using a bone density scan.  If you are 68 years of age or older, or if you are at risk for osteoporosis and fractures, ask your health care provider if you should be screened.  Ask your health care provider whether you should take a calcium or vitamin D supplement to lower your risk for osteoporosis.  Menopause may have certain physical symptoms and risks.  Hormone replacement therapy may reduce some of these symptoms and risks. Talk to your health care provider about whether hormone replacement therapy is right for you. Follow these instructions at home:  Schedule regular health, dental, and eye exams.  Stay current with your immunizations.  Do not use any tobacco products including cigarettes, chewing tobacco, or electronic cigarettes.  If you are pregnant, do not drink alcohol.  If you are  breastfeeding, limit how much and how often you drink alcohol.  Limit alcohol intake to no more than 1 drink per day for nonpregnant women. One drink equals 12 ounces of beer, 5 ounces of wine, or 1 ounces of hard liquor.  Do not use street drugs.  Do not share needles.  Ask your health care provider for help if you need support or information about quitting drugs.  Tell your health care provider if you often feel depressed.  Tell your health care provider if you have ever been abused or do not feel safe at home. This information is not intended to replace advice given to you by your health care provider. Make sure you discuss any questions you have with your health care provider. Document Released: 06/27/2011 Document Revised: 05/19/2016 Document Reviewed: 09/15/2015  2017 [employer]

## 2016-11-09 NOTE — Progress Notes (Signed)
Pre visit review using our clinic review tool, if applicable. No additional management support is needed unless otherwise documented below in the visit note. 

## 2016-11-10 DIAGNOSIS — Z0000 Encounter for general adult medical examination without abnormal findings: Secondary | ICD-10-CM | POA: Insufficient documentation

## 2016-11-10 LAB — HEPATITIS C ANTIBODY: HCV Ab: NEGATIVE

## 2016-11-10 NOTE — Assessment & Plan Note (Signed)
Checking CBC and adjust as needed. Not taking iron right now.

## 2016-11-10 NOTE — Assessment & Plan Note (Signed)
Taking symbicort and has albuterol if needed. No flare today. No major SOB with activity.

## 2016-11-10 NOTE — Assessment & Plan Note (Addendum)
Mammogram ordered, checking labs. Flu shot done previously, declines tetanus. Colonoscopy she thinks was done around 52 but is not sure where to get records. She has a copy at home and will try to bring it here. Has not had a pap smear recently. Checking hep c screening. Given 10 year screening recommendations.

## 2016-12-12 ENCOUNTER — Other Ambulatory Visit: Payer: Self-pay | Admitting: Internal Medicine

## 2016-12-12 DIAGNOSIS — Z1231 Encounter for screening mammogram for malignant neoplasm of breast: Secondary | ICD-10-CM

## 2017-02-22 ENCOUNTER — Telehealth: Payer: Self-pay | Admitting: Internal Medicine

## 2017-02-22 NOTE — Telephone Encounter (Signed)
Pt called stated she has been sick: coughing,dsypnea. Pt was hopping to get something call in, advise pt she need an appt first but pt req this massage send first.

## 2017-02-22 NOTE — Telephone Encounter (Signed)
She can try otc cough medicine and start taking flonase or zyrtec for symptoms.

## 2017-02-22 NOTE — Telephone Encounter (Signed)
Patient contacted and stated awareness 

## 2017-04-28 DIAGNOSIS — F6381 Intermittent explosive disorder: Secondary | ICD-10-CM | POA: Diagnosis not present

## 2017-06-16 DIAGNOSIS — F6381 Intermittent explosive disorder: Secondary | ICD-10-CM | POA: Diagnosis not present

## 2017-10-25 DIAGNOSIS — F6381 Intermittent explosive disorder: Secondary | ICD-10-CM | POA: Diagnosis not present

## 2017-11-28 DIAGNOSIS — F6381 Intermittent explosive disorder: Secondary | ICD-10-CM | POA: Diagnosis not present

## 2018-01-25 DIAGNOSIS — F6381 Intermittent explosive disorder: Secondary | ICD-10-CM | POA: Diagnosis not present

## 2018-02-05 DIAGNOSIS — Z131 Encounter for screening for diabetes mellitus: Secondary | ICD-10-CM | POA: Diagnosis not present

## 2018-02-05 DIAGNOSIS — Z13220 Encounter for screening for lipoid disorders: Secondary | ICD-10-CM | POA: Diagnosis not present

## 2018-02-05 DIAGNOSIS — Z1329 Encounter for screening for other suspected endocrine disorder: Secondary | ICD-10-CM | POA: Diagnosis not present

## 2018-02-05 DIAGNOSIS — Z113 Encounter for screening for infections with a predominantly sexual mode of transmission: Secondary | ICD-10-CM | POA: Diagnosis not present

## 2018-02-05 DIAGNOSIS — J45909 Unspecified asthma, uncomplicated: Secondary | ICD-10-CM | POA: Diagnosis not present

## 2018-02-05 DIAGNOSIS — Z139 Encounter for screening, unspecified: Secondary | ICD-10-CM | POA: Diagnosis not present

## 2018-02-05 DIAGNOSIS — Z01419 Encounter for gynecological examination (general) (routine) without abnormal findings: Secondary | ICD-10-CM | POA: Diagnosis not present

## 2018-02-05 DIAGNOSIS — Z0000 Encounter for general adult medical examination without abnormal findings: Secondary | ICD-10-CM | POA: Diagnosis not present

## 2018-02-05 DIAGNOSIS — F6381 Intermittent explosive disorder: Secondary | ICD-10-CM | POA: Diagnosis not present

## 2018-02-22 ENCOUNTER — Encounter: Payer: Self-pay | Admitting: Gastroenterology

## 2018-02-22 ENCOUNTER — Other Ambulatory Visit: Payer: Self-pay | Admitting: "Women's Health Care

## 2018-02-22 DIAGNOSIS — Z1231 Encounter for screening mammogram for malignant neoplasm of breast: Secondary | ICD-10-CM

## 2018-03-05 DIAGNOSIS — F6381 Intermittent explosive disorder: Secondary | ICD-10-CM | POA: Diagnosis not present

## 2018-03-22 ENCOUNTER — Ambulatory Visit: Payer: Commercial Managed Care - HMO

## 2018-03-28 ENCOUNTER — Telehealth: Payer: Self-pay | Admitting: *Deleted

## 2018-03-28 NOTE — Telephone Encounter (Signed)
Called number listed, line busy, unable to leave a message. Canceled colon and PV and mailed no show letter.

## 2018-03-28 NOTE — Telephone Encounter (Signed)
No show pv today. Called pt., line busy will try again later.

## 2018-04-02 ENCOUNTER — Ambulatory Visit: Payer: Commercial Managed Care - HMO

## 2018-04-05 DIAGNOSIS — F6381 Intermittent explosive disorder: Secondary | ICD-10-CM | POA: Diagnosis not present

## 2018-04-09 ENCOUNTER — Encounter: Payer: Self-pay | Admitting: Gastroenterology

## 2018-04-11 ENCOUNTER — Encounter: Payer: Commercial Managed Care - HMO | Admitting: Gastroenterology

## 2018-05-07 DIAGNOSIS — F6381 Intermittent explosive disorder: Secondary | ICD-10-CM | POA: Diagnosis not present

## 2018-05-11 ENCOUNTER — Encounter (HOSPITAL_COMMUNITY): Payer: Self-pay | Admitting: Emergency Medicine

## 2018-05-11 ENCOUNTER — Emergency Department (HOSPITAL_COMMUNITY): Payer: Medicare HMO

## 2018-05-11 ENCOUNTER — Inpatient Hospital Stay (HOSPITAL_COMMUNITY)
Admission: EM | Admit: 2018-05-11 | Discharge: 2018-05-15 | DRG: 190 | Disposition: A | Discharge status: Home / Self Care | Payer: Medicare HMO | Attending: Internal Medicine | Admitting: Internal Medicine

## 2018-05-11 ENCOUNTER — Other Ambulatory Visit: Payer: Self-pay

## 2018-05-11 DIAGNOSIS — I959 Hypotension, unspecified: Secondary | ICD-10-CM | POA: Diagnosis not present

## 2018-05-11 DIAGNOSIS — F70 Mild intellectual disabilities: Secondary | ICD-10-CM

## 2018-05-11 DIAGNOSIS — J9811 Atelectasis: Secondary | ICD-10-CM | POA: Diagnosis not present

## 2018-05-11 DIAGNOSIS — T17890A Other foreign object in other parts of respiratory tract causing asphyxiation, initial encounter: Secondary | ICD-10-CM | POA: Diagnosis not present

## 2018-05-11 DIAGNOSIS — R000 Tachycardia, unspecified: Secondary | ICD-10-CM | POA: Diagnosis not present

## 2018-05-11 DIAGNOSIS — J9601 Acute respiratory failure with hypoxia: Secondary | ICD-10-CM | POA: Diagnosis present

## 2018-05-11 DIAGNOSIS — R062 Wheezing: Secondary | ICD-10-CM | POA: Diagnosis not present

## 2018-05-11 DIAGNOSIS — E8770 Fluid overload, unspecified: Secondary | ICD-10-CM | POA: Diagnosis present

## 2018-05-11 DIAGNOSIS — J45901 Unspecified asthma with (acute) exacerbation: Secondary | ICD-10-CM | POA: Diagnosis not present

## 2018-05-11 DIAGNOSIS — R55 Syncope and collapse: Secondary | ICD-10-CM | POA: Diagnosis present

## 2018-05-11 DIAGNOSIS — K219 Gastro-esophageal reflux disease without esophagitis: Secondary | ICD-10-CM | POA: Diagnosis present

## 2018-05-11 DIAGNOSIS — X58XXXA Exposure to other specified factors, initial encounter: Secondary | ICD-10-CM | POA: Diagnosis present

## 2018-05-11 DIAGNOSIS — J441 Chronic obstructive pulmonary disease with (acute) exacerbation: Principal | ICD-10-CM

## 2018-05-11 DIAGNOSIS — E785 Hyperlipidemia, unspecified: Secondary | ICD-10-CM | POA: Diagnosis present

## 2018-05-11 DIAGNOSIS — R0602 Shortness of breath: Secondary | ICD-10-CM | POA: Diagnosis not present

## 2018-05-11 DIAGNOSIS — J45909 Unspecified asthma, uncomplicated: Secondary | ICD-10-CM | POA: Diagnosis not present

## 2018-05-11 DIAGNOSIS — J4551 Severe persistent asthma with (acute) exacerbation: Secondary | ICD-10-CM | POA: Diagnosis not present

## 2018-05-11 DIAGNOSIS — Z825 Family history of asthma and other chronic lower respiratory diseases: Secondary | ICD-10-CM | POA: Diagnosis not present

## 2018-05-11 DIAGNOSIS — R069 Unspecified abnormalities of breathing: Secondary | ICD-10-CM | POA: Diagnosis not present

## 2018-05-11 DIAGNOSIS — A419 Sepsis, unspecified organism: Secondary | ICD-10-CM | POA: Diagnosis present

## 2018-05-11 LAB — CBC
HEMATOCRIT: 37.6 % (ref 36.0–46.0)
HEMOGLOBIN: 12.5 g/dL (ref 12.0–15.0)
MCH: 30.4 pg (ref 26.0–34.0)
MCHC: 33.2 g/dL (ref 30.0–36.0)
MCV: 91.5 fL (ref 78.0–100.0)
Platelets: 210 10*3/uL (ref 150–400)
RBC: 4.11 MIL/uL (ref 3.87–5.11)
RDW: 12.5 % (ref 11.5–15.5)
WBC: 10.5 10*3/uL (ref 4.0–10.5)

## 2018-05-11 LAB — BASIC METABOLIC PANEL
Anion gap: 10 (ref 5–15)
BUN: 11 mg/dL (ref 6–20)
CALCIUM: 9.8 mg/dL (ref 8.9–10.3)
CHLORIDE: 107 mmol/L (ref 101–111)
CO2: 22 mmol/L (ref 22–32)
Creatinine, Ser: 0.79 mg/dL (ref 0.44–1.00)
GFR calc Af Amer: >60 mL/min (ref >60)
GFR calc non Af Amer: >60 mL/min (ref >60)
GLUCOSE: 160 mg/dL — AB (ref 65–99)
POTASSIUM: 3.5 mmol/L (ref 3.5–5.1)
Sodium: 139 mmol/L (ref 135–145)

## 2018-05-11 LAB — HEPATIC FUNCTION PANEL
ALBUMIN: 4.2 g/dL (ref 3.5–5.0)
ALT: 16 U/L (ref 14–54)
AST: 22 U/L (ref 15–41)
Alkaline Phosphatase: 60 U/L (ref 38–126)
BILIRUBIN INDIRECT: 1.4 mg/dL — AB (ref 0.3–0.9)
Bilirubin, Direct: 0.2 mg/dL (ref 0.1–0.5)
TOTAL PROTEIN: 6.9 g/dL (ref 6.5–8.1)
Total Bilirubin: 1.6 mg/dL — ABNORMAL HIGH (ref 0.3–1.2)

## 2018-05-11 LAB — I-STAT ARTERIAL BLOOD GAS, ED
Acid-base deficit: 2 mmol/L (ref 0.0–2.0)
Bicarbonate: 22 mmol/L (ref 20.0–28.0)
O2 Saturation: 92 %
PCO2 ART: 35 mmHg (ref 32.0–48.0)
Patient temperature: 98.6
TCO2: 23 mmol/L (ref 22–32)
pH, Arterial: 7.407 (ref 7.350–7.450)
pO2, Arterial: 63 mmHg — ABNORMAL LOW (ref 83.0–108.0)

## 2018-05-11 LAB — I-STAT CG4 LACTIC ACID, ED
LACTIC ACID, VENOUS: 2 mmol/L — AB (ref 0.5–1.9)
LACTIC ACID, VENOUS: 3.59 mmol/L — AB (ref 0.5–1.9)

## 2018-05-11 LAB — TROPONIN I

## 2018-05-11 LAB — PROCALCITONIN: Procalcitonin: 0.19 ng/mL

## 2018-05-11 LAB — LACTIC ACID, PLASMA: Lactic Acid, Venous: 4.6 mmol/L (ref 0.5–1.9)

## 2018-05-11 LAB — I-STAT BETA HCG BLOOD, ED (MC, WL, AP ONLY): I-stat hCG, quantitative: 7.6 m[IU]/mL — ABNORMAL HIGH (ref <5)

## 2018-05-11 MED ORDER — METHYLPREDNISOLONE SODIUM SUCC 125 MG IJ SOLR
125.0000 mg | Freq: Once | INTRAMUSCULAR | Status: AC
  Administered 2018-05-11: 125 mg via INTRAVENOUS
  Filled 2018-05-11: qty 2

## 2018-05-11 MED ORDER — SODIUM CHLORIDE 0.9 % IV BOLUS (SEPSIS)
1000.0000 mL | Freq: Once | INTRAVENOUS | Status: AC
  Administered 2018-05-11: 1000 mL via INTRAVENOUS

## 2018-05-11 MED ORDER — SODIUM CHLORIDE 0.9 % IV SOLN
500.0000 mg | INTRAVENOUS | Status: DC
  Administered 2018-05-11 – 2018-05-12 (×2): 500 mg via INTRAVENOUS
  Filled 2018-05-11 (×3): qty 500

## 2018-05-11 MED ORDER — ACETAMINOPHEN 325 MG PO TABS
650.0000 mg | ORAL_TABLET | Freq: Four times a day (QID) | ORAL | Status: DC | PRN
  Filled 2018-05-11: qty 2

## 2018-05-11 MED ORDER — ONDANSETRON HCL 4 MG/2ML IJ SOLN
4.0000 mg | Freq: Three times a day (TID) | INTRAMUSCULAR | Status: DC | PRN
  Administered 2018-05-15: 4 mg via INTRAVENOUS
  Filled 2018-05-11: qty 2

## 2018-05-11 MED ORDER — SODIUM CHLORIDE 0.9 % IV SOLN
INTRAVENOUS | Status: DC
  Administered 2018-05-11 – 2018-05-14 (×4): via INTRAVENOUS

## 2018-05-11 MED ORDER — ALBUTEROL (5 MG/ML) CONTINUOUS INHALATION SOLN
10.0000 mg/h | INHALATION_SOLUTION | Freq: Once | RESPIRATORY_TRACT | Status: AC
  Administered 2018-05-11: 10 mg/h via RESPIRATORY_TRACT
  Filled 2018-05-11: qty 20

## 2018-05-11 MED ORDER — IPRATROPIUM BROMIDE 0.02 % IN SOLN
1.0000 mg | Freq: Once | RESPIRATORY_TRACT | Status: AC
  Administered 2018-05-11: 1 mg via RESPIRATORY_TRACT
  Filled 2018-05-11: qty 5

## 2018-05-11 MED ORDER — ZOLPIDEM TARTRATE 5 MG PO TABS: 5.0000 mg | ORAL_TABLET | Freq: Every evening | ORAL | Status: DC | PRN

## 2018-05-11 MED ORDER — FUROSEMIDE 10 MG/ML IJ SOLN
INTRAMUSCULAR | Status: AC
  Filled 2018-05-11: qty 2

## 2018-05-11 MED ORDER — FUROSEMIDE 10 MG/ML IJ SOLN
20.0000 mg | Freq: Once | INTRAMUSCULAR | Status: AC
  Administered 2018-05-11: 20 mg via INTRAVENOUS

## 2018-05-11 MED ORDER — METHYLPREDNISOLONE SODIUM SUCC 125 MG IJ SOLR
60.0000 mg | Freq: Three times a day (TID) | INTRAMUSCULAR | Status: DC
  Administered 2018-05-11 – 2018-05-13 (×5): 60 mg via INTRAVENOUS
  Filled 2018-05-11 (×5): qty 2

## 2018-05-11 MED ORDER — SODIUM CHLORIDE 0.9 % IV BOLUS
500.0000 mL | Freq: Once | INTRAVENOUS | Status: AC
Start: 2018-05-11 — End: 2018-05-11
  Administered 2018-05-11: 500 mL via INTRAVENOUS

## 2018-05-11 MED ORDER — LEVALBUTEROL HCL 1.25 MG/0.5ML IN NEBU
1.2500 mg | INHALATION_SOLUTION | Freq: Four times a day (QID) | RESPIRATORY_TRACT | Status: DC
  Administered 2018-05-12 (×2): 1.25 mg via RESPIRATORY_TRACT
  Filled 2018-05-11 (×7): qty 0.5

## 2018-05-11 MED ORDER — DM-GUAIFENESIN ER 30-600 MG PO TB12: 1.0000 | ORAL_TABLET | Freq: Two times a day (BID) | ORAL | Status: DC | PRN

## 2018-05-11 MED ORDER — SODIUM CHLORIDE 0.9 % IV BOLUS (SEPSIS)
500.0000 mL | Freq: Once | INTRAVENOUS | Status: AC
  Administered 2018-05-11: 500 mL via INTRAVENOUS

## 2018-05-11 MED ORDER — ENOXAPARIN SODIUM 30 MG/0.3ML ~~LOC~~ SOLN
30.0000 mg | SUBCUTANEOUS | Status: DC
  Administered 2018-05-12 – 2018-05-14 (×3): 30 mg via SUBCUTANEOUS
  Filled 2018-05-11 (×4): qty 0.3

## 2018-05-11 MED ORDER — SODIUM CHLORIDE 0.9 % IV BOLUS
1000.0000 mL | Freq: Once | INTRAVENOUS | Status: AC
  Administered 2018-05-11: 1000 mL via INTRAVENOUS

## 2018-05-11 MED ORDER — IPRATROPIUM BROMIDE 0.02 % IN SOLN
0.5000 mg | RESPIRATORY_TRACT | Status: DC
  Administered 2018-05-11 (×2): 0.5 mg via RESPIRATORY_TRACT
  Filled 2018-05-11 (×2): qty 2.5

## 2018-05-11 MED ORDER — CEFTRIAXONE SODIUM 2 G IJ SOLR
2.0000 g | INTRAMUSCULAR | Status: DC
Start: 2018-05-11 — End: 2018-05-14
  Administered 2018-05-11 – 2018-05-13 (×3): 2 g via INTRAVENOUS
  Filled 2018-05-11 (×4): qty 20

## 2018-05-11 MED ORDER — IOPAMIDOL (ISOVUE-370) INJECTION 76%
INTRAVENOUS | Status: AC
  Administered 2018-05-11: 100 mL
  Filled 2018-05-11: qty 100

## 2018-05-11 NOTE — ED Notes (Signed)
Writer attempted to collect urine, but pt kept falling asleep on the bedpan.

## 2018-05-11 NOTE — ED Provider Notes (Signed)
Care transferred to me.  CT scan without acute PE.  Does show mucous plugging and likely atelectasis.  I think with her fever and other symptoms this is consistent with pneumonia.  She is not in distress but does still have increased work of breathing.  No significant wheezing on my exam.  Her blood pressures have fluctuated between high 80s and low 90s but map has been around 65.  Good mental status and no signs of septic shock.  Her lactate did increase but this was and after an hour-long albuterol nebulizer and so I do not think this is sepsis related but rather from the albuterol.  Dr. Clyde Lundborg to admit   [name] Loveless, MD 05/11/18 [phone]

## 2018-05-11 NOTE — ED Triage Notes (Signed)
Patient brought in for shortness of breath. Prior to triage while in room with EMS patient became unresponsive and started seizing, episode lasted approximately 15 seconds, then patient started vomiting. Maintained strong corotid pulse during event. Patient lethargic but oriented at this time.

## 2018-05-11 NOTE — ED Notes (Signed)
Portable at the bedside.  

## 2018-05-11 NOTE — ED Notes (Signed)
Pt requested something to eat. Ok per [employer]. Pt given a [place] sandwich, apple sauce, graham crackers, peanut butter and diet coke to drink

## 2018-05-11 NOTE — ED Notes (Signed)
Tech noticed patient was working harder. RN went to assess patient and noticed she had labored breathing. MD came by and evaluated patient and placed orders

## 2018-05-11 NOTE — ED Provider Notes (Signed)
[facility] [address] EMERGENCY DEPARTMENT Provider Note   CSN: [id number] Arrival date & time: 05/11/18  1315     History   Chief Complaint Chief Complaint  Patient presents with  . Shortness of Breath    HPI [name] [name] is a 62 y.o. female.  HPI   61 year old female with history of asthma and intellectual disabilities here with shortness of breath.  The patient states that her symptoms started over the last 2 to 3 days.  She has had progressive worsening cough and sputum production.  She said yellow-green sputum.  She is had fevers up to 102.  She said general fatigue.  She states over the last 24 hours, she is gotten significantly worse and has begun to develop severe shortness of breath.  Per report, she was found to be satting 82% on room air and does not use oxygen at baseline.  She had a near syncopal episode in the waiting room.  She feels better after breathing treatments and oxygen.  She denies any chest pain.  Denies any recent medication changes.  No recent illnesses.  Denies any abdominal pain, nausea, or vomiting.  No headache or neck stiffness.  No other complaints.  Past Medical History:  Diagnosis Date  . Acute bronchitis   . Anemia, unspecified   . Esophageal reflux   . Mild intellectual disabilities   . Other acute reactions to stress   . Pneumonia, organism unspecified(486)   . Unspecified asthma(493.90)     Patient Active Problem List   Diagnosis Date Noted  . Routine general medical examination at a health care facility 11/10/2016  . Iron deficiency anemia 12/16/2014  . Asthma, chronic 04/10/2013    Past Surgical History:  Procedure Laterality Date  . TONSILLECTOMY       OB History   None      Home Medications    Prior to Admission medications   Medication Sig Start Date End Date Taking? Authorizing Provider  albuterol (PROVENTIL HFA;VENTOLIN HFA) 108 (90 Base) MCG/ACT inhaler Inhale 2 puffs into the lungs every 4 (four) hours  as needed for wheezing or shortness of breath. Patient not taking: Reported on 05/11/2018 03/20/16   Gerhard Munch, MD  budesonide-formoterol [facility]) 160-4.5 MCG/ACT inhaler Inhale 2 puffs into the lungs 2 (two) times daily. Patient taking differently: Inhale 2 puffs into the lungs 2 (two) times daily as needed.  10/14/15   [name], MD  simvastatin (ZOCOR) 20 MG tablet Take 1 tablet (20 mg total) by mouth at bedtime. Patient not taking: Reported on 05/11/2018 10/22/15   [name], MD    Family History Family History  Problem Relation Age of Onset  . Colon cancer Father   . Asthma Sister     Social History Social History   Tobacco Use  . Smoking status: Never Smoker  . Smokeless tobacco: Never Used  Substance Use Topics  . Alcohol use: No  . Drug use: No     Allergies   Patient has no known allergies.   Review of Systems Review of Systems  Constitutional: Positive for chills, fatigue and fever.  HENT: Negative for congestion and rhinorrhea.   Eyes: Negative for visual disturbance.  Respiratory: Positive for cough, shortness of breath and wheezing.   Cardiovascular: Negative for chest pain and leg swelling.  Gastrointestinal: Negative for abdominal pain, diarrhea, nausea and vomiting.  Genitourinary: Negative for dysuria and flank pain.  Musculoskeletal: Negative for neck pain and neck stiffness.  Skin:  Negative for rash and wound.  Allergic/Immunologic: Negative for immunocompromised state.  Neurological: Positive for weakness. Negative for syncope and headaches.  All other systems reviewed and are negative.    Physical Exam Updated Vital Signs BP (!) 88/51   Pulse (!) 101   Temp 98.5 F (36.9 C) (Oral)   Resp (!) 22   Ht  (1.575 m)   Wt 46.3 kg (102 lb)   SpO2 93%   BMI 18.66 kg/m   Physical Exam  Constitutional: She is oriented to person, place, and time. She appears well-developed and well-nourished. She appears ill.  She appears distressed.  HENT:  Head: Normocephalic and atraumatic.  Eyes: Conjunctivae are normal.  Neck: Neck supple.  Cardiovascular: Normal rate, regular rhythm and normal heart sounds. Exam reveals no friction rub.  No murmur heard. Pulmonary/Chest: Effort normal. Tachypnea noted. No respiratory distress. She has decreased breath sounds. She has wheezes in the right upper field, the right middle field, the right lower field, the left upper field, the left middle field and the left lower field. She has no rales.  Abdominal: She exhibits no distension.  Musculoskeletal: She exhibits no edema.  Neurological: She is alert and oriented to person, place, and time. She exhibits normal muscle tone.  Skin: Skin is warm. Capillary refill takes less than 2 seconds.  Psychiatric: She has a normal mood and affect.  Nursing note and vitals reviewed.    ED Treatments / Results  Labs (all labs ordered are listed, but only abnormal results are displayed) Labs Reviewed  BASIC METABOLIC PANEL - Abnormal; Notable for the following components:      Result Value   Glucose, Bld 160 (*)    All other components within normal limits  I-STAT BETA HCG BLOOD, ED ([facility], [facility], [facility] ONLY) - Abnormal; Notable for the following components:   I-stat hCG, quantitative 7.6 (*)    All other components within normal limits  I-STAT CG4 LACTIC ACID, ED - Abnormal; Notable for the following components:   Lactic Acid, Venous 2.00 (*)    All other components within normal limits  I-STAT ARTERIAL BLOOD GAS, ED - Abnormal; Notable for the following components:   pO2, Arterial 63.0 (*)    All other components within normal limits  I-STAT CG4 LACTIC ACID, ED - Abnormal; Notable for the following components:   Lactic Acid, Venous 3.59 (*)    All other components within normal limits  CULTURE, BLOOD (ROUTINE X 2)  CULTURE, BLOOD (ROUTINE X 2)  URINE CULTURE  CBC  URINALYSIS, ROUTINE W REFLEX MICROSCOPIC  HEPATIC FUNCTION  PANEL  BRAIN NATRIURETIC PEPTIDE  TROPONIN I    EKG EKG Interpretation  Date/Time:  Friday May 11 2018 13:27:22 EDT Ventricular Rate:  107 PR Interval:  178 QRS Duration: 92 QT Interval:  338 QTC Calculation: 451 R Axis:   75 Text Interpretation:  Sinus tachycardia Nonspecific T wave abnormality Abnormal ECG Since last EKG, rate has increased Confirmed by Shaune Pollack [phone]) on 05/11/2018 5:07:49 PM   Radiology Dg Chest Portable 1 View  Result Date: 05/11/2018 CLINICAL DATA:  Shortness of breath EXAM: PORTABLE CHEST 1 VIEW COMPARISON:  03/20/2016 FINDINGS: Cardiac shadow is within normal limits. Lungs are well aerated bilaterally. Previously seen infiltrative changes have resolved. No acute bony abnormality is noted. IMPRESSION: No acute abnormality noted. Electronically Signed   By: Alcide Clever M.D.   On: 05/11/2018 15:02    Procedures .Critical Care Performed by: Shaune Pollack, MD Authorized by: Erma Heritage,  Sheria Lang, MD   Critical care provider statement:    Critical care time (minutes):  45   Critical care time was exclusive of:  Separately billable procedures and treating other patients and teaching time   Critical care was necessary to treat or prevent imminent or life-threatening deterioration of the following conditions:  Respiratory failure, circulatory failure, cardiac failure and sepsis   Critical care was time spent personally by me on the following activities:  Development of treatment plan with patient or surrogate, discussions with consultants, evaluation of patient's response to treatment, examination of patient, obtaining history from patient or surrogate, ordering and performing treatments and interventions, ordering and review of laboratory studies, ordering and review of radiographic studies, pulse oximetry, re-evaluation of patient's condition and review of old charts   I assumed direction of critical care for this patient from another provider in my specialty:  no     (including critical care time)  Medications Ordered in ED Medications  cefTRIAXone (ROCEPHIN) 2 g in sodium chloride 0.9 % 100 mL IVPB (0 g Intravenous Stopped 05/11/18 1508)  azithromycin (ZITHROMAX) 500 mg in sodium chloride 0.9 % 250 mL IVPB (0 mg Intravenous Stopped 05/11/18 1606)  sodium chloride 0.9 % bolus 1,000 mL (1,000 mLs Intravenous New Bag/Given 05/11/18 1633)  iopamidol (ISOVUE-370) 76 % injection (has no administration in time range)  sodium chloride 0.9 % bolus 1,000 mL (has no administration in time range)  sodium chloride 0.9 % bolus 1,000 mL (0 mLs Intravenous Stopped 05/11/18 1537)    And  sodium chloride 0.9 % bolus 500 mL (0 mLs Intravenous Stopped 05/11/18 1537)  methylPREDNISolone sodium succinate (SOLU-MEDROL) 125 mg/2 mL injection 125 mg (125 mg Intravenous Given 05/11/18 1433)  albuterol (PROVENTIL,VENTOLIN) solution continuous neb (10 mg/hr Nebulization Given 05/11/18 1436)  ipratropium (ATROVENT) nebulizer solution 1 mg (1 mg Nebulization Given 05/11/18 1436)     Initial Impression / Assessment and Plan / ED Course  I have reviewed the triage vital signs and the nursing notes.  Pertinent labs & imaging results that were available during my care of the patient were reviewed by me and considered in my medical decision making (see chart for details).     62 year old female here with cough, shortness of breath, fever, and transient syncopal episode in the waiting room.  EKG shows sinus tachycardia.  Patient is hypoxic, tachycardic, and hypotensive.  Code sepsis initiated with treatment for suspected pulmonary source given this is the primary complaint.  She does have soft blood pressures but is mentating well.  Steroids given.  Will monitor closely.  Chest x-ray is clear.  Patient continues to only voiced complaints of cough and shortness of breath.  Oropharynx is clear.  No neck stiffness, headache, or signs of meningitis or encephalitis.  Abdomen is completely  soft, nontender, nondistended.  Denies any nausea or vomiting.  No diarrhea.  No skin rashes.  Will check CT angios given this is her primary complaint, continue fluids.  Depending on response to fluids, will need ICU versus stepdown.  Patient care transferred to Dr. Criss Alvine at the end of my shift. Patient presentation, ED course, and plan of care discussed with review of all pertinent labs and imaging. Please see his/her note for further details regarding further ED course and disposition.   Final Clinical Impressions(s) / ED Diagnoses   Final diagnoses:  Sepsis, due to unspecified organism [facility])  COPD exacerbation [facility])    ED Discharge Orders    None  Shaune Pollack, MD 05/11/18 [phone]

## 2018-05-11 NOTE — ED Notes (Signed)
ED Provider at bedside. 

## 2018-05-11 NOTE — ED Notes (Signed)
EMS report Shortness of breath that started yesterday, history of asthma. Does not have albuterol inhaler at home but she does have a prescription. Productive cough with yellow sputum. EMS reports temporal temp 102.74F. HR 115. RR 24. SpO2 82% on room air.  albuterol, 0.5mg  atrovent. 18g saline lock in left forearm.

## 2018-05-11 NOTE — H&P (Addendum)
History and Physical    [name] [name] WUJ:[id number] DOB: 10-04-1956 DOA: 05/11/2018  Referring MD/NP/PA:   PCP: [name], MD   Patient coming from:  The patient is coming from home.  At baseline, pt is independent for most of ADL.  Chief Complaint: SOB, cough and fever  HPI: [name] [name] is a 62 y.o. female with medical history significant of asthma, hyperlipidemia, GERD, intellectual disability, who presents with shortness breath, cough and fever.  Patient states that she has been having cough and shortness of breath for more than 2 days, which has been progressively getting worse.  She coughs up yellow-colored sputum. She has chills and fever up to 102 at home. Patient states that she has intermittent mild chest pain at home, but no chest pain currently. Per report, she was found to be satting 82% on room air and does not use oxygen at baseline. Per report, pt had a near syncopal episode in the waiting room.  Patient does not have nausea, vomiting, diarrhea, abdominal pain, symptoms of UTI or unilateral weakness. Does not have albuterol inhaler at home.  Patient was initially hypotensive with blood pressure 85/58, which improved to SBP>90s after 2.5 L of NS (her BW is 46 Kg).   ED Course: pt was found to have WBC 10.5, lactic acid 2.06, 3.59, 4.6, pending urinalysis, electrolytes renal function okay, temperature 99.2, tachycardia, tachypnea, oxygen saturation 92% on room air, negative chest x-ray for infiltration.  CT angiogram of chest is negative for PE, showed left lower lobe mucous plugging in the left basilar atelectasis.  Patient is admitted to stepdown seen patient.  ABG with pH 7.407, PCO2 35, PO2 63.  Review of Systems:   General: has fevers, chills, no body weight gain, has poor appetite, has fatigue HEENT: no blurry vision, hearing changes or sore throat Respiratory: has dyspnea, coughing, no wheezing CV: has chest pain, no palpitations GI: no nausea, vomiting,  abdominal pain, diarrhea, constipation GU: no dysuria, burning on urination, increased urinary frequency, hematuria  Ext: no leg edema Neuro: no unilateral weakness, numbness, or tingling, no vision change or hearing loss Skin: no rash, no skin tear. MSK: No muscle spasm, no deformity, no limitation of range of movement in spin Heme: No easy bruising.  Travel history: No recent long distant travel.  Allergy: No Known Allergies  Past Medical History:  Diagnosis Date  . Acute bronchitis   . Anemia, unspecified   . Esophageal reflux   . Mild intellectual disabilities   . Other acute reactions to stress   . Pneumonia, organism unspecified(486)   . Unspecified asthma(493.90)     Past Surgical History:  Procedure Laterality Date  . TONSILLECTOMY      Social History:  reports that she has never smoked. She has never used smokeless tobacco. She reports that she does not drink alcohol or use drugs.  Family History:  Family History  Problem Relation Age of Onset  . Colon cancer Father   . Asthma Sister      Prior to Admission medications   Medication Sig Start Date End Date Taking? Authorizing Provider  budesonide-formoterol (SYMBICORT) 160-4.5 MCG/ACT inhaler Inhale 2 puffs into the lungs 2 (two) times daily. Patient taking differently: Inhale 2 puffs into the lungs 2 (two) times daily as needed.  10/14/15  Yes [name], MD  albuterol (PROVENTIL HFA;VENTOLIN HFA) 108 (90 Base) MCG/ACT inhaler Inhale 2 puffs into the lungs every 4 (four) hours as needed for wheezing or shortness  of breath. Patient not taking: Reported on 05/11/2018 03/20/16   Gerhard Munch, MD  simvastatin (ZOCOR) 20 MG tablet Take 1 tablet (20 mg total) by mouth at bedtime. Patient not taking: Reported on 05/11/2018 10/22/15   [name], MD    Physical Exam: Vitals:   05/11/18 2315 05/11/18 2330 05/11/18 2345 05/11/18 2353  BP: 97/61 112/67 104/66   Pulse: 74 82 70 67  Resp: (!) 22  (!) 23 (!) 26 (!) 23  Temp:      TempSrc:      SpO2: 95% 98% 92% 100%  Weight:      Height:       General: Not in acute distress HEENT:       Eyes: PERRL, EOMI, no scleral icterus.       ENT: No discharge from the ears and nose, no pharynx injection, no tonsillar enlargement.        Neck: No JVD, no bruit, no mass felt. Heme: No neck lymph node enlargement. Cardiac: S1/S2, RRR, No murmurs, No gallops or rubs. Respiratory: Severely decreased air movement bilaterally.  GI: Soft, nondistended, nontender, no rebound pain, no organomegaly, BS present. GU: No hematuria Ext: No pitting leg edema bilaterally. 2+DP/PT pulse bilaterally. Musculoskeletal: No joint deformities, No joint redness or warmth, no limitation of ROM in spin. Skin: No rashes.  Neuro: Alert, oriented X3, cranial nerves II-XII grossly intact, moves all extremities normally.  Psych: Patient is not psychotic, no suicidal or hemocidal ideation.  Labs on Admission: I have personally reviewed following labs and imaging studies  CBC: Recent Labs  Lab 05/11/18 1334  WBC 10.5  HGB 12.5  HCT 37.6  MCV 91.5  PLT 210   Basic Metabolic Panel: Recent Labs  Lab 05/11/18 1334  NA 139  K 3.5  CL 107  CO2 22  GLUCOSE 160*  BUN 11  CREATININE 0.79  CALCIUM 9.8   GFR: Estimated Creatinine Clearance: 54.7 mL/min (by C-G formula based on SCr of 0.79 mg/dL). Liver Function Tests: Recent Labs  Lab 05/11/18 1928  AST 22  ALT 16  ALKPHOS 60  BILITOT 1.6*  PROT 6.9  ALBUMIN 4.2   No results for input(s): LIPASE, AMYLASE in the last 168 hours. No results for input(s): AMMONIA in the last 168 hours. Coagulation Profile: No results for input(s): INR, PROTIME in the last 168 hours. Cardiac Enzymes: Recent Labs  Lab 05/11/18 1928  TROPONINI <0.03   BNP (last 3 results) No results for input(s): PROBNP in the last 8760 hours. HbA1C: No results for input(s): HGBA1C in the last 72 hours. CBG: No results for  input(s): GLUCAP in the last 168 hours. Lipid Profile: No results for input(s): CHOL, HDL, LDLCALC, TRIG, CHOLHDL, LDLDIRECT in the last 72 hours. Thyroid Function Tests: No results for input(s): TSH, T4TOTAL, FREET4, T3FREE, THYROIDAB in the last 72 hours. Anemia Panel: No results for input(s): VITAMINB12, FOLATE, FERRITIN, TIBC, IRON, RETICCTPCT in the last 72 hours. Urine analysis:    Component Value Date/Time   COLORURINE YELLOW 12/07/2014 0513   APPEARANCEUR CLEAR 12/07/2014 0513   LABSPEC 1.018 12/07/2014 0513   PHURINE 5.5 12/07/2014 0513   GLUCOSEU NEGATIVE 12/07/2014 0513   HGBUR NEGATIVE 12/07/2014 0513   BILIRUBINUR NEGATIVE 12/07/2014 0513   KETONESUR 15 (A) 12/07/2014 0513   PROTEINUR NEGATIVE 12/07/2014 0513   UROBILINOGEN 0.2 12/07/2014 0513   NITRITE NEGATIVE 12/07/2014 0513   LEUKOCYTESUR NEGATIVE 12/07/2014 0513   Sepsis Labs: (procalcitonin:4,lacticidven:4) )No results found for this or any  previous visit (from the past 240 hour(s)).   Radiological Exams on Admission: Ct Angio Chest Pe W Or Wo Contrast  Result Date: 05/11/2018 CLINICAL DATA:  62 year old female with acute chest pain and shortness of breath today. EXAM: CT ANGIOGRAPHY CHEST WITH CONTRAST TECHNIQUE: Multidetector CT imaging of the chest was performed using the standard protocol during bolus administration of intravenous contrast. Multiplanar CT image reconstructions and MIPs were obtained to evaluate the vascular anatomy. CONTRAST:  ISOVUE-370 IOPAMIDOL (ISOVUE-370) INJECTION 76% COMPARISON:  05/11/2018 and prior chest radiographs. 11/19/2004 chest CT FINDINGS: Cardiovascular: This is a technically satisfactory study. No pulmonary emboli are identified. UPPER limits normal heart size noted. Mild coronary artery and thoracic aortic atherosclerotic calcifications are noted. There is no evidence of thoracic aortic aneurysm or definite dissection. No pericardial effusion identified.  Mediastinum/Nodes: No enlarged mediastinal, hilar, or axillary lymph nodes. Thyroid gland, trachea, and esophagus demonstrate no significant findings. Lungs/Pleura: LEFT basilar atelectasis noted. Mucous filling LEFT LOWER lobe bronchi noted. There is no evidence of airspace disease, consolidation, mass, nodule, pleural effusion or pneumothorax. Upper Abdomen: No acute abnormality Musculoskeletal: No acute or suspicious bony abnormalities. Review of the MIP images confirms the above findings. IMPRESSION: 1. No evidence of pulmonary emboli or thoracic aortic aneurysm/definite dissection. 2. LEFT LOWER lobe mucous plugging with LEFT basilar atelectasis. 3. Coronary artery and Aortic Atherosclerosis (ICD10-I70.0). Electronically Signed   By: Harmon Pier M.D.   On: 05/11/2018 18:13   Dg Chest Portable 1 View  Result Date: 05/11/2018 CLINICAL DATA:  Shortness of breath EXAM: PORTABLE CHEST 1 VIEW COMPARISON:  03/20/2016 FINDINGS: Cardiac shadow is within normal limits. Lungs are well aerated bilaterally. Previously seen infiltrative changes have resolved. No acute bony abnormality is noted. IMPRESSION: No acute abnormality noted. Electronically Signed   By: Alcide Clever M.D.   On: 05/11/2018 15:02     EKG: Independently reviewed.  Sinus rhythm, QTC 451, biphasic T wave in inferior leads, V5-V6  Assessment/Plan Principal Problem:   Acute respiratory failure with hypoxia (HCC) Active Problems:   Asthma exacerbation   Sepsis (HCC)   HLD (hyperlipidemia)   Acute respiratory failure with hypoxia due to asthma exacerbation: Patient does not have wheezing or rhonchi, but has severely decreased air movement indicating possible asthma exacerbation.  Chest x-ray negative for infiltration.  CT angiogram is negative for PE, but showed left lower lobe mucous plugging in the left base atelectasis. Since pt is septic with hypotension, will start patient with IV antibiotics.  -will admit to SDU as inpt -Nebulizers:  Scheduled Atrovent and prn Xopenex nebs -Solu-Medrol 60 mg IV q8h  -Rocephin and azithromycin -Mucinex for cough   -Urine S. pneumococcal antigen -Follow up blood culture x2, sputum culture, respiratory virus panel, Flu pcr  Sepsis due to asthma exacerbation: Patient meets criteria for sepsis with hypotension, tachycardia and tachypnea.  Temperature 99.2, no leukocytosis.  Lactic acid is elevated up to 4.6.  Blood pressure responded to aggressive IV fluid.  Patient received 3.5 L normal saline bolus in ED. Because of soft blood pressure, 500 cc normal saline was given again.  The patient has maintenance IV fluid normal saline at 125 cc/h. Later on, patient developed worsening shortness of breath and has crackles on auscultation, indicating fluid overloaded.  Patient was given 1 dose of Lasix, 20 mg by IV. Started BiPAP. -Will get ABG -will get Procalcitonin and trend lactic acid levels per sepsis protocol. -IVF: 4.0L of NS bolus in ED, followed by 125 cc/h-->d/'ced IVF -Abx as  above  HLD (hyperlipidemia): LDL was 122 on 11/09/16. Not on statin -check FLP    DVT ppx: SQ Lovenox Code Status: Full code Family Communication: None at bed side.   Disposition Plan:  Anticipate discharge back to previous home environment Consults called:  none Admission status: SDU/inpation       Date of Service 05/12/2018    Lorretta Harp [facility] Pager [phone]  If 7PM-7AM, please contact night-coverage [email or web address] Password [email or web address] 05/12/2018, 12:08 AM

## 2018-05-11 NOTE — ED Notes (Signed)
Pt. Placed in POd C 29, placed on the monitor, call bell at the bedside.

## 2018-05-12 ENCOUNTER — Other Ambulatory Visit: Payer: Self-pay

## 2018-05-12 ENCOUNTER — Encounter (HOSPITAL_COMMUNITY): Payer: Self-pay | Admitting: *Deleted

## 2018-05-12 LAB — RESPIRATORY PANEL BY PCR
ADENOVIRUS-RVPPCR: NOT DETECTED
Bordetella pertussis: NOT DETECTED
CORONAVIRUS NL63-RVPPCR: NOT DETECTED
CORONAVIRUS OC43-RVPPCR: NOT DETECTED
Chlamydophila pneumoniae: NOT DETECTED
Coronavirus 229E: NOT DETECTED
Coronavirus HKU1: NOT DETECTED
INFLUENZA A-RVPPCR: NOT DETECTED
Influenza B: NOT DETECTED
Metapneumovirus: NOT DETECTED
Mycoplasma pneumoniae: NOT DETECTED
PARAINFLUENZA VIRUS 1-RVPPCR: NOT DETECTED
PARAINFLUENZA VIRUS 2-RVPPCR: NOT DETECTED
PARAINFLUENZA VIRUS 3-RVPPCR: NOT DETECTED
PARAINFLUENZA VIRUS 4-RVPPCR: NOT DETECTED
RHINOVIRUS / ENTEROVIRUS - RVPPCR: DETECTED — AB
Respiratory Syncytial Virus: NOT DETECTED

## 2018-05-12 LAB — STREP PNEUMONIAE URINARY ANTIGEN: Strep Pneumo Urinary Antigen: NEGATIVE

## 2018-05-12 LAB — URINALYSIS, ROUTINE W REFLEX MICROSCOPIC
BILIRUBIN URINE: NEGATIVE
Bacteria, UA: NONE SEEN
KETONES UR: NEGATIVE mg/dL
LEUKOCYTES UA: NEGATIVE
NITRITE: NEGATIVE
Protein, ur: NEGATIVE mg/dL
Specific Gravity, Urine: 1.009 (ref 1.005–1.030)
pH: 6 (ref 5.0–8.0)

## 2018-05-12 LAB — I-STAT VENOUS BLOOD GAS, ED
ACID-BASE DEFICIT: 9 mmol/L — AB (ref 0.0–2.0)
BICARBONATE: 16.6 mmol/L — AB (ref 20.0–28.0)
O2 Saturation: 99 %
TCO2: 18 mmol/L — ABNORMAL LOW (ref 22–32)
pCO2, Ven: 32.6 mmHg — ABNORMAL LOW (ref 44.0–60.0)
pH, Ven: 7.315 (ref 7.250–7.430)
pO2, Ven: 136 mmHg — ABNORMAL HIGH (ref 32.0–45.0)

## 2018-05-12 LAB — LIPID PANEL
Cholesterol: 182 mg/dL (ref 0–200)
HDL: 71 mg/dL (ref >40)
LDL CALC: 105 mg/dL — AB (ref 0–99)
Total CHOL/HDL Ratio: 2.6 RATIO
Triglycerides: 29 mg/dL (ref <150)
VLDL: 6 mg/dL (ref 0–40)

## 2018-05-12 LAB — INFLUENZA PANEL BY PCR (TYPE A & B)
Influenza A By PCR: NEGATIVE
Influenza B By PCR: NEGATIVE

## 2018-05-12 LAB — LACTIC ACID, PLASMA
LACTIC ACID, VENOUS: 1.5 mmol/L (ref 0.5–1.9)
Lactic Acid, Venous: 2.2 mmol/L (ref 0.5–1.9)
Lactic Acid, Venous: 4.2 mmol/L (ref 0.5–1.9)

## 2018-05-12 MED ORDER — IPRATROPIUM BROMIDE 0.02 % IN SOLN
0.5000 mg | Freq: Four times a day (QID) | RESPIRATORY_TRACT | Status: DC
  Administered 2018-05-12 (×3): 0.5 mg via RESPIRATORY_TRACT
  Filled 2018-05-12 (×3): qty 2.5

## 2018-05-12 NOTE — Progress Notes (Signed)
Patient ID: [name] [name], female   DOB: 04-29-1956, 62 y.o.   MRN: [id number]                                                                PROGRESS NOTE                                                                                                                                                                                                             Patient Demographics:    [name] [name], is a 62 y.o. female, DOB - 06/23/1956, JXB:[id number]  Admit date - 05/11/2018   Admitting Physician Lorretta Harp, MD  Outpatient Primary MD for the patient is [name], MD  LOS - 1  Outpatient Specialists:     Chief Complaint  Patient presents with  . Shortness of Breath       Brief Narrative    62 y.o. female with medical history significant of asthma, hyperlipidemia, GERD, intellectual disability, who presents with shortness breath, cough and fever.  Patient states that she has been having cough and shortness of breath for more than 2 days, which has been progressively getting worse.  She coughs up yellow-colored sputum. She has chills and fever up to 102 at home. Patient states that she has intermittent mild chest pain at home, but no chest pain currently.Per report, she was found to be satting 82% on room air and does not use oxygen at baseline. Per report, pt had a near syncopal episode in the waiting room.  Patient does not have nausea, vomiting, diarrhea, abdominal pain, symptoms of UTI or unilateral weakness. Does not have albuterol inhaler at home.  Patient was initially hypotensive with blood pressure 85/58, which improved to SBP>90s after 2.5 L of NS (her BW is 46 Kg).   ED Course: pt was found to have WBC 10.5, lactic acid 2.06, 3.59, 4.6, pending urinalysis, electrolytes renal function okay, temperature 99.2, tachycardia, tachypnea, oxygen saturation 92% on room air, negative chest x-ray for infiltration.  CT angiogram of chest is negative for PE, showed left lower lobe  mucous plugging in the left basilar atelectasis.  Patient is admitted to stepdown seen patient.  ABG with pH 7.407, PCO2 35, PO2 63.      Subjective:    [name] [name] today states that  her breathing is better.  Afebrile overnite.  Slight cough, mostly dry.  Denies cp, palp, n/v, abd pain, diarrhea, brbpr, black stool.  Tolerating abx   No headache,  No new weakness tingling or numbness,    Assessment  & Plan :    Principal Problem:   Acute respiratory failure with hypoxia (HCC) Active Problems:   Asthma exacerbation   Sepsis (HCC)   HLD (hyperlipidemia)    Acute respiratory failure with hypoxia due to asthma exacerbation:  Per admission physician (Patient does not have wheezing or rhonchi, but has severely decreased air movement indicating possible asthma exacerbation.  Chest x-ray negative for infiltration.  CT angiogram is negative for PE, but showed left lower lobe mucous plugging in the left base atelectasis.) Urine strep pneumo antigen pending resp panel => +rhinovirus, enterovirus Blood culture x2 pending Cont rocephin, zithromax vi Cont solumedrol  iv q8h, try to taper to prednisone tomorrow Cont breathing treatments  Sepsis due to asthma exacerbation:  Per admission physician (Patient meets criteria for sepsis with hypotension, tachycardia and tachypnea.  Temperature 99.2, no leukocytosis.  Lactic acid is elevated up to 4.6.  Blood pressure responded to aggressive IV fluid.  Patient received 3.5 L normal saline bolus in ED. Because of soft blood pressure, 500 cc normal saline was given again.  The patient has maintenance IV fluid normal saline at 125 cc/h. Later on, patient developed worsening shortness of breath and has crackles on auscultation, indicating fluid overloaded.  Patient was given 1 dose of Lasix, 20 mg by IV. Started BiPAP) Abx as above Check cbc, cmp in am  HLD (hyperlipidemia): LDL was 122 on 11/09/16. Not on statin Check FLP=> LDL  105    DVT ppx: SQ Lovenox Code Status: Full code Family Communication: None at bed side.   Disposition Plan:  Anticipate discharge back to previous home environment Consults called:  none Admission status: SDU/inpation        .    Lab Results  Component Value Date   PLT 210 05/11/2018    Antibiotics  :  Rocephin/ zithromax 5/17=>   Anti-infectives (From admission, onward)   Start     Dose/Rate Route Frequency Ordered Stop   05/11/18 1415  cefTRIAXone (ROCEPHIN) 2 g in sodium chloride 0.9 % 100 mL IVPB     2 g 200 mL/hr over 30 Minutes Intravenous Every 24 hours 05/11/18 1413     05/11/18 1415  azithromycin (ZITHROMAX) 500 mg in sodium chloride 0.9 % 250 mL IVPB     500 mg 250 mL/hr over 60 Minutes Intravenous Every 24 hours 05/11/18 1413          Objective:   Vitals:   05/12/18 0757 05/12/18 0800 05/12/18 0815 05/12/18 0830  BP:  131/83 (!) 126/96 116/81  Pulse: 63 63 (!) 58 (!) 55  Resp: (!) 21 20 (!) 25 (!) 27  Temp:      TempSrc:      SpO2: 100% 100% 100% 100%  Weight:      Height:        Wt Readings from Last 3 Encounters:  05/11/18 46.3 kg (102 lb)  11/09/16 44.9 kg (99 lb)  03/29/16 49.9 kg (110 lb)    No intake or output data in the 24 hours ending 05/12/18 0846   Physical Exam  Awake Alert, Oriented X 3, No new F.N deficits, Normal affect [place].AT,PERRAL Supple Neck,No JVD, No cervical lymphadenopathy appriciated.  Symmetrical Chest wall movement, slight bilateral exp wheezing, no  crackles.  RRR,No Gallops,Rubs or new Murmurs, No Parasternal Heave +ve B.Sounds, Abd Soft, No tenderness, No organomegaly appriciated, No rebound - guarding or rigidity. No Cyanosis, Clubbing or edema, No new Rash or bruise  Cachexia     Data Review:    CBC Recent Labs  Lab 05/11/18 1334  WBC 10.5  HGB 12.5  HCT 37.6  PLT 210  MCV 91.5  MCH 30.4  MCHC 33.2  RDW 12.5    Chemistries  Recent Labs  Lab 05/11/18 1334 05/11/18 1928  NA 139  --    K 3.5  --   CL 107  --   CO2 22  --   GLUCOSE 160*  --   BUN 11  --   CREATININE 0.79  --   CALCIUM 9.8  --   AST  --  22  ALT  --  16  ALKPHOS  --  60  BILITOT  --  1.6*   ------------------------------------------------------------------------------------------------------------------ Recent Labs    05/12/18 0322  CHOL 182  HDL 71  LDLCALC 105*  TRIG 29  CHOLHDL 2.6    Lab Results  Component Value Date   HGBA1C 5.4 12/16/2014   ------------------------------------------------------------------------------------------------------------------ No results for input(s): TSH, T4TOTAL, T3FREE, THYROIDAB in the last 72 hours.  Invalid input(s): FREET3 ------------------------------------------------------------------------------------------------------------------ No results for input(s): VITAMINB12, FOLATE, FERRITIN, TIBC, IRON, RETICCTPCT in the last 72 hours.  Coagulation profile No results for input(s): INR, PROTIME in the last 168 hours.  No results for input(s): DDIMER in the last 72 hours.  Cardiac Enzymes Recent Labs  Lab 05/11/18 1928  TROPONINI <0.03   ------------------------------------------------------------------------------------------------------------------ No results found for: BNP  Inpatient Medications  Scheduled Meds: . enoxaparin (LOVENOX) injection  30 mg Subcutaneous Q24H  . ipratropium  0.5 mg Nebulization Q6H  . levalbuterol  1.25 mg Nebulization Q6H  . methylPREDNISolone (SOLU-MEDROL) injection  60 mg Intravenous TID   Continuous Infusions: . sodium chloride Stopped (05/12/18 0720)  . azithromycin Stopped (05/11/18 1606)  . cefTRIAXone (ROCEPHIN)  IV Stopped (05/11/18 1508)   PRN Meds:.acetaminophen, dextromethorphan-guaiFENesin, ondansetron (ZOFRAN) IV, zolpidem  Micro Results Recent Results (from the past 240 hour(s))  Respiratory Panel by PCR     Status: Abnormal   Collection Time: 05/11/18 11:50 PM  Result Value Ref Range  Status   Adenovirus NOT DETECTED NOT DETECTED Final   Coronavirus 229E NOT DETECTED NOT DETECTED Final   Coronavirus HKU1 NOT DETECTED NOT DETECTED Final   Coronavirus NL63 NOT DETECTED NOT DETECTED Final   Coronavirus OC43 NOT DETECTED NOT DETECTED Final   Metapneumovirus NOT DETECTED NOT DETECTED Final   Rhinovirus / Enterovirus DETECTED (A) NOT DETECTED Final   Influenza A NOT DETECTED NOT DETECTED Final   Influenza B NOT DETECTED NOT DETECTED Final   Parainfluenza Virus 1 NOT DETECTED NOT DETECTED Final   Parainfluenza Virus 2 NOT DETECTED NOT DETECTED Final   Parainfluenza Virus 3 NOT DETECTED NOT DETECTED Final   Parainfluenza Virus 4 NOT DETECTED NOT DETECTED Final   Respiratory Syncytial Virus NOT DETECTED NOT DETECTED Final   Bordetella pertussis NOT DETECTED NOT DETECTED Final   Chlamydophila pneumoniae NOT DETECTED NOT DETECTED Final   Mycoplasma pneumoniae NOT DETECTED NOT DETECTED Final    Radiology Reports Ct Angio Chest Pe W Or Wo Contrast  Result Date: 05/11/2018 CLINICAL DATA:  62 year old female with acute chest pain and shortness of breath today. EXAM: CT ANGIOGRAPHY CHEST WITH CONTRAST TECHNIQUE: Multidetector CT imaging of the chest was performed using the standard protocol  during bolus administration of intravenous contrast. Multiplanar CT image reconstructions and MIPs were obtained to evaluate the vascular anatomy. CONTRAST:  ISOVUE-370 IOPAMIDOL (ISOVUE-370) INJECTION 76% COMPARISON:  05/11/2018 and prior chest radiographs. 11/19/2004 chest CT FINDINGS: Cardiovascular: This is a technically satisfactory study. No pulmonary emboli are identified. UPPER limits normal heart size noted. Mild coronary artery and thoracic aortic atherosclerotic calcifications are noted. There is no evidence of thoracic aortic aneurysm or definite dissection. No pericardial effusion identified. Mediastinum/Nodes: No enlarged mediastinal, hilar, or axillary lymph nodes. Thyroid gland,  trachea, and esophagus demonstrate no significant findings. Lungs/Pleura: LEFT basilar atelectasis noted. Mucous filling LEFT LOWER lobe bronchi noted. There is no evidence of airspace disease, consolidation, mass, nodule, pleural effusion or pneumothorax. Upper Abdomen: No acute abnormality Musculoskeletal: No acute or suspicious bony abnormalities. Review of the MIP images confirms the above findings. IMPRESSION: 1. No evidence of pulmonary emboli or thoracic aortic aneurysm/definite dissection. 2. LEFT LOWER lobe mucous plugging with LEFT basilar atelectasis. 3. Coronary artery and Aortic Atherosclerosis (ICD10-I70.0). Electronically Signed   By: Harmon Pier M.D.   On: 05/11/2018 18:13   Dg Chest Portable 1 View  Result Date: 05/11/2018 CLINICAL DATA:  Shortness of breath EXAM: PORTABLE CHEST 1 VIEW COMPARISON:  03/20/2016 FINDINGS: Cardiac shadow is within normal limits. Lungs are well aerated bilaterally. Previously seen infiltrative changes have resolved. No acute bony abnormality is noted. IMPRESSION: No acute abnormality noted. Electronically Signed   By: Alcide Clever M.D.   On: 05/11/2018 15:02    Time Spent in minutes  30   Pearson Grippe M.D on 05/12/2018 at 8:46 AM  Between 7am to 7pm - Pager - [phone]  After 7pm go to [email or web address] - password [facility]  [facility] -  Office  [phone]

## 2018-05-12 NOTE — Progress Notes (Signed)
ABG 7.31/32.3/135/16.6 99%. ABG obtained on Bipap 12/6 35% BUR 8.

## 2018-05-12 NOTE — ED Notes (Signed)
Lunch ordered; heart healthy 

## 2018-05-12 NOTE — Progress Notes (Signed)
Pt alert and oriented x4, no complaints of pain or discomfort.  Bed in low position, call bell within reach.  Bed alarms on and functioning.  Assessment done and charted.  O2  liters [place].  Tele monitor applied to pt as ordered.   Will continue to monitor and do hourly rounding throughout the shift/  Pain noted   0/10.

## 2018-05-13 DIAGNOSIS — J441 Chronic obstructive pulmonary disease with (acute) exacerbation: Secondary | ICD-10-CM

## 2018-05-13 LAB — CBC
HEMATOCRIT: 31.6 % — AB (ref 36.0–46.0)
Hemoglobin: 10 g/dL — ABNORMAL LOW (ref 12.0–15.0)
MCH: 29.8 pg (ref 26.0–34.0)
MCHC: 31.6 g/dL (ref 30.0–36.0)
MCV: 94 fL (ref 78.0–100.0)
Platelets: 178 10*3/uL (ref 150–400)
RBC: 3.36 MIL/uL — ABNORMAL LOW (ref 3.87–5.11)
RDW: 12.6 % (ref 11.5–15.5)
WBC: 10.6 10*3/uL — ABNORMAL HIGH (ref 4.0–10.5)

## 2018-05-13 LAB — COMPREHENSIVE METABOLIC PANEL
ALT: 29 U/L (ref 14–54)
AST: 30 U/L (ref 15–41)
Albumin: 3.2 g/dL — ABNORMAL LOW (ref 3.5–5.0)
Alkaline Phosphatase: 54 U/L (ref 38–126)
Anion gap: 8 (ref 5–15)
BILIRUBIN TOTAL: 0.3 mg/dL (ref 0.3–1.2)
BUN: 15 mg/dL (ref 6–20)
CO2: 20 mmol/L — ABNORMAL LOW (ref 22–32)
Calcium: 8.6 mg/dL — ABNORMAL LOW (ref 8.9–10.3)
Chloride: 113 mmol/L — ABNORMAL HIGH (ref 101–111)
Creatinine, Ser: 0.79 mg/dL (ref 0.44–1.00)
GFR calc Af Amer: >60 mL/min (ref >60)
Glucose, Bld: 174 mg/dL — ABNORMAL HIGH (ref 65–99)
POTASSIUM: 3.8 mmol/L (ref 3.5–5.1)
Sodium: 141 mmol/L (ref 135–145)
TOTAL PROTEIN: 5.6 g/dL — AB (ref 6.5–8.1)

## 2018-05-13 LAB — URINE CULTURE: Culture: <10000 — AB

## 2018-05-13 LAB — MRSA PCR SCREENING: MRSA BY PCR: NEGATIVE

## 2018-05-13 MED ORDER — AZITHROMYCIN 250 MG PO TABS
500.0000 mg | ORAL_TABLET | ORAL | Status: DC
  Administered 2018-05-13: 500 mg via ORAL
  Filled 2018-05-13: qty 2

## 2018-05-13 MED ORDER — PREDNISONE 20 MG PO TABS
60.0000 mg | ORAL_TABLET | Freq: Every day | ORAL | Status: DC
  Administered 2018-05-14: 60 mg via ORAL
  Filled 2018-05-13: qty 3

## 2018-05-13 MED ORDER — PREDNISONE 20 MG PO TABS: 60.0000 mg | ORAL_TABLET | Freq: Every day | ORAL | Status: DC

## 2018-05-13 MED ORDER — LEVALBUTEROL HCL 1.25 MG/0.5ML IN NEBU
1.2500 mg | INHALATION_SOLUTION | Freq: Three times a day (TID) | RESPIRATORY_TRACT | Status: DC
  Administered 2018-05-13 – 2018-05-14 (×4): 1.25 mg via RESPIRATORY_TRACT
  Filled 2018-05-13 (×4): qty 0.5

## 2018-05-13 MED ORDER — IPRATROPIUM BROMIDE 0.02 % IN SOLN
0.5000 mg | Freq: Three times a day (TID) | RESPIRATORY_TRACT | Status: DC
  Administered 2018-05-13 – 2018-05-14 (×4): 0.5 mg via RESPIRATORY_TRACT
  Filled 2018-05-13 (×4): qty 2.5

## 2018-05-13 MED ORDER — LEVALBUTEROL HCL 0.63 MG/3ML IN NEBU
0.6300 mg | INHALATION_SOLUTION | RESPIRATORY_TRACT | Status: DC | PRN
  Administered 2018-05-13: 0.63 mg via RESPIRATORY_TRACT
  Filled 2018-05-13: qty 3

## 2018-05-13 NOTE — Progress Notes (Signed)
Patient ID: [name] [name], female   DOB: August 28, 1956, 62 y.o.   MRN: [id number]                                                                PROGRESS NOTE                                                                                                                                                                                                             Patient Demographics:    [name] [name], is a 62 y.o. female, DOB - [birth year], JXB:[id number]  Admit date - 05/11/2018   Admitting Physician [name] Harp, MD  Outpatient Primary MD for the patient is [name] Broker, MD  LOS - 2  Outpatient Specialists:   Chief Complaint  Patient presents with  . Shortness of Breath       Brief Narrative   62 y.o.femalewith medical history significant ofasthma, hyperlipidemia, GERD, intellectual disability, who presents with shortness breath, cough and fever.  Patient states that she has been having cough and shortness of breath for more than 2 days, which has been progressively getting worse. She coughs up yellow-colored sputum. She has chills and fever up to 102at home.Patient states that she has intermittent mild chest pain at home, but no chest pain currently.Per report, she was found to be satting 82% on room air and does not use oxygen at baseline.Per report, pthad a near syncopal episode in the waiting room. Patient does not have nausea, vomiting, diarrhea, abdominal pain, symptoms of UTI or unilateral weakness.Does not have albuterol inhaler at home.Patient was initially hypotensive with blood pressure 85/58, which improved to SBP>90s after 2.5 L of NS (her BW is 46 Kg).  ED Course:pt was found to haveWBC 10.5, lactic acid 2.06, 3.59, 4.6, pending urinalysis, electrolytes renal function okay, temperature 99.2, tachycardia, tachypnea, oxygen saturation 92% on room air, negative chest x-ray for infiltration. CT angiogram of chest is negative for PE, showed left lower lobe mucous  plugging in the left basilar atelectasis. Patient is admitted to stepdown seen patient. ABG with pH 7.407, PCO2 35, PO2 63.      Subjective:    [name] [name] today is feeling like her breathing is better.  Afebrile.  Trace cough (dry)  No headache, No chest pain, No abdominal pain -  No Nausea, No new weakness tingling or numbness   Assessment  & Plan :    Principal Problem:   Acute respiratory failure with hypoxia (HCC) Active Problems:   Asthma exacerbation   Sepsis (HCC)   HLD (hyperlipidemia)    Acute respiratory failure with hypoxiadue to asthma exacerbation: Per admission physician (Patient does not have wheezing or rhonchi, but has severely decreased air movement indicating possible asthma exacerbation. Chest x-ray negative for infiltration. CT angiogram is negative for PE, but showed left lower lobe mucous plugging in the left base atelectasis.) Urine strep pneumo antigen pending resp panel => +rhinovirus, enterovirus Blood culture x2 pending Cont rocephin, zithromax vi Stop solumedrol  iv q8h 5/19,  Prednisone  po qday Cont breathing treatments  Sepsis due to asthma exacerbation: Per admission physician (Patient meets criteria for sepsis with hypotension, tachycardia and tachypnea. Temperature 99.2, no leukocytosis. Lactic acid is elevated up to 4.6. Blood pressure responded to aggressive IV fluid. Patient received 3.5 L normal saline bolus in ED. Because of soft blood pressure, 500 cc normal saline was given again.Thepatient has maintenance IV fluid normal saline at 125 cc/h. Later on,patient developed worsening shortness of breath and has crackles on auscultation, indicating fluid overloaded. Patient was given 1 dose of Lasix, 20 mg by IV. Started BiPAP) Abx as above Check cbc, cmp in am  HLD (hyperlipidemia): LDL was 122 on 11/09/16. Not on statin Check FLP=> LDL 105    DVT ppx: SQ Lovenox Code Status:Full code Family  Communication: None at bed side.  Disposition Plan: Anticipate discharge back to previous home environment Consults called:none Admission status:SDU/inpation    .  Antibiotics  :  Rocephin/ zithromax 5/17=>           Lab Results  Component Value Date   PLT 178 05/13/2018     Anti-infectives (From admission, onward)   Start     Dose/Rate Route Frequency Ordered Stop   05/13/18 1400  azithromycin (ZITHROMAX) tablet 500 mg     500 mg Oral Every 24 hours 05/13/18 1308     05/11/18 1415  cefTRIAXone (ROCEPHIN) 2 g in sodium chloride 0.9 % 100 mL IVPB     2 g 200 mL/hr over 30 Minutes Intravenous Every 24 hours 05/11/18 1413     05/11/18 1415  azithromycin (ZITHROMAX) 500 mg in sodium chloride 0.9 % 250 mL IVPB  Status:  Discontinued     500 mg 250 mL/hr over 60 Minutes Intravenous Every 24 hours 05/11/18 1413 05/13/18 1308        Objective:   Vitals:   05/13/18 0815 05/13/18 0932 05/13/18 1100 05/13/18 1150  BP: 133/87   (!) 127/112  Pulse: (!) 56  66 73  Resp: (!) 27  20 (!) 21  Temp: 98.4 F (36.9 C)   97.6 F (36.4 C)  TempSrc: Oral   Oral  SpO2: 95% 97% 98% 96%  Weight:      Height:        Wt Readings from Last 3 Encounters:  05/11/18 46.3 kg (102 lb)  11/09/16 44.9 kg (99 lb)  03/29/16 49.9 kg (110 lb)     Intake/Output Summary (Last 24 hours) at 05/13/2018 1348 Last data filed at 05/13/2018 1100 Gross per 24 hour  Intake 1800 ml  Output 400 ml  Net 1400 ml     Physical Exam  Awake Alert, Oriented X 3, No new F.N deficits, Normal affect [place].AT,PERRAL Supple Neck,No JVD, No cervical lymphadenopathy appriciated.  Symmetrical Chest wall  movement, Good air movement bilaterally, trace wheezing, no crackles.  RRR,No Gallops,Rubs or new Murmurs, No Parasternal Heave +ve B.Sounds, Abd Soft, No tenderness, No organomegaly appriciated, No rebound - guarding or rigidity. No Cyanosis, Clubbing or edema, No new Rash or bruise      Data  Review:    CBC Recent Labs  Lab 05/11/18 1334 05/13/18 0310  WBC 10.5 10.6*  HGB 12.5 10.0*  HCT 37.6 31.6*  PLT 210 178  MCV 91.5 94.0  MCH 30.4 29.8  MCHC 33.2 31.6  RDW 12.5 12.6    Chemistries  Recent Labs  Lab 05/11/18 1334 05/11/18 1928 05/13/18 0310  NA 139  --  141  K 3.5  --  3.8  CL 107  --  113*  CO2 22  --  20*  GLUCOSE 160*  --  174*  BUN 11  --  15  CREATININE 0.79  --  0.79  CALCIUM 9.8  --  8.6*  AST  --  22 30  ALT  --  16 29  ALKPHOS  --  60 54  BILITOT  --  1.6* 0.3   ------------------------------------------------------------------------------------------------------------------ Recent Labs    05/12/18 0322  CHOL 182  HDL 71  LDLCALC 105*  TRIG 29  CHOLHDL 2.6    Lab Results  Component Value Date   HGBA1C 5.4 12/16/2014   ------------------------------------------------------------------------------------------------------------------ No results for input(s): TSH, T4TOTAL, T3FREE, THYROIDAB in the last 72 hours.  Invalid input(s): FREET3 ------------------------------------------------------------------------------------------------------------------ No results for input(s): VITAMINB12, FOLATE, FERRITIN, TIBC, IRON, RETICCTPCT in the last 72 hours.  Coagulation profile No results for input(s): INR, PROTIME in the last 168 hours.  No results for input(s): DDIMER in the last 72 hours.  Cardiac Enzymes Recent Labs  Lab 05/11/18 1928  TROPONINI <0.03   ------------------------------------------------------------------------------------------------------------------ No results found for: BNP  Inpatient Medications  Scheduled Meds: . azithromycin  500 mg Oral Q24H  . enoxaparin (LOVENOX) injection  30 mg Subcutaneous Q24H  . ipratropium  0.5 mg Nebulization TID  . levalbuterol  1.25 mg Nebulization TID  . methylPREDNISolone (SOLU-MEDROL) injection  60 mg Intravenous TID   Continuous Infusions: . sodium chloride 125  mL/hr at 05/13/18 0700  . cefTRIAXone (ROCEPHIN)  IV Stopped (05/12/18 1713)   PRN Meds:.acetaminophen, dextromethorphan-guaiFENesin, levalbuterol, ondansetron (ZOFRAN) IV, zolpidem  Micro Results Recent Results (from the past 240 hour(s))  Blood Culture (routine x 2)     Status: None (Preliminary result)   Collection Time: 05/11/18  2:20 PM  Result Value Ref Range Status   Specimen Description BLOOD RIGHT ANTECUBITAL  Final   Special Requests   Final    BOTTLES DRAWN AEROBIC AND ANAEROBIC Blood Culture adequate volume   Culture   Final    NO GROWTH 2 DAYS Performed at [facility], [address] [place], [place] [address]    Report Status PENDING  Incomplete  Blood Culture (routine x 2)     Status: None (Preliminary result)   Collection Time: 05/11/18  2:36 PM  Result Value Ref Range Status   Specimen Description BLOOD BLOOD RIGHT WRIST  Final   Special Requests   Final    BOTTLES DRAWN AEROBIC AND ANAEROBIC Blood Culture results may not be optimal due to an inadequate volume of blood received in culture bottles   Culture   Final    NO GROWTH 2 DAYS Performed at [facility], [address] [place], [place] [address]    Report Status PENDING  Incomplete  Respiratory Panel by PCR     Status: Abnormal   Collection Time: 05/11/18 11:50 PM  Result Value Ref Range Status   Adenovirus NOT DETECTED NOT DETECTED Final   Coronavirus 229E NOT DETECTED NOT DETECTED Final   Coronavirus HKU1 NOT DETECTED NOT DETECTED Final   Coronavirus NL63 NOT DETECTED NOT DETECTED Final   Coronavirus OC43 NOT DETECTED NOT DETECTED Final   Metapneumovirus NOT DETECTED NOT DETECTED Final   Rhinovirus / Enterovirus DETECTED (A) NOT DETECTED Final   Influenza A NOT DETECTED NOT DETECTED Final   Influenza B NOT DETECTED NOT DETECTED Final   Parainfluenza Virus 1 NOT DETECTED NOT DETECTED Final   Parainfluenza Virus 2 NOT DETECTED NOT DETECTED Final   Parainfluenza Virus 3 NOT DETECTED NOT  DETECTED Final   Parainfluenza Virus 4 NOT DETECTED NOT DETECTED Final   Respiratory Syncytial Virus NOT DETECTED NOT DETECTED Final   Bordetella pertussis NOT DETECTED NOT DETECTED Final   Chlamydophila pneumoniae NOT DETECTED NOT DETECTED Final   Mycoplasma pneumoniae NOT DETECTED NOT DETECTED Final  Urine culture     Status: Abnormal   Collection Time: 05/12/18 12:17 AM  Result Value Ref Range Status   Specimen Description URINE, CLEAN CATCH  Final   Special Requests NONE  Final   Culture (A)  Final    <10,000 COLONIES/mL INSIGNIFICANT GROWTH Performed at [facility], [address] [place], [place] [address]    Report Status 05/13/2018 FINAL  Final  MRSA PCR Screening     Status: None   Collection Time: 05/13/18 12:46 AM  Result Value Ref Range Status   MRSA by PCR NEGATIVE NEGATIVE Final    Comment:        The GeneXpert MRSA Assay (FDA approved for NASAL specimens only), is one component of a comprehensive MRSA colonization surveillance program. It is not intended to diagnose MRSA infection nor to guide or monitor treatment for MRSA infections. Performed at [facility], [address], [place], [place] [address]     Radiology Reports Ct Angio Chest Pe W Or Wo Contrast  Result Date: 05/11/2018 CLINICAL DATA:  62 year old female with acute chest pain and shortness of breath today. EXAM: CT ANGIOGRAPHY CHEST WITH CONTRAST TECHNIQUE: Multidetector CT imaging of the chest was performed using the standard protocol during bolus administration of intravenous contrast. Multiplanar CT image reconstructions and MIPs were obtained to evaluate the vascular anatomy. CONTRAST:  ISOVUE-370 IOPAMIDOL (ISOVUE-370) INJECTION 76% COMPARISON:  05/11/2018 and prior chest radiographs. 11/19/2004 chest CT FINDINGS: Cardiovascular: This is a technically satisfactory study. No pulmonary emboli are identified. UPPER limits normal heart size noted. Mild coronary artery and thoracic  aortic atherosclerotic calcifications are noted. There is no evidence of thoracic aortic aneurysm or definite dissection. No pericardial effusion identified. Mediastinum/Nodes: No enlarged mediastinal, hilar, or axillary lymph nodes. Thyroid gland, trachea, and esophagus demonstrate no significant findings. Lungs/Pleura: LEFT basilar atelectasis noted. Mucous filling LEFT LOWER lobe bronchi noted. There is no evidence of airspace disease, consolidation, mass, nodule, pleural effusion or pneumothorax. Upper Abdomen: No acute abnormality Musculoskeletal: No acute or suspicious bony abnormalities. Review of the MIP images confirms the above findings. IMPRESSION: 1. No evidence of pulmonary emboli or thoracic aortic aneurysm/definite dissection. 2. LEFT LOWER lobe mucous plugging with LEFT basilar atelectasis. 3. Coronary artery and Aortic Atherosclerosis (ICD10-I70.0). Electronically Signed   By: Harmon Pier M.D.   On: 05/11/2018 18:13   Dg Chest Portable 1 View  Result Date: 05/11/2018 CLINICAL DATA:  Shortness of breath EXAM: PORTABLE CHEST 1 VIEW COMPARISON:  03/20/2016 FINDINGS: Cardiac shadow is within normal limits. Lungs are well aerated bilaterally. Previously seen infiltrative changes have resolved. No acute bony abnormality is noted. IMPRESSION: No acute abnormality noted. Electronically Signed   By: Alcide Clever M.D.   On: 05/11/2018 15:02    Time Spent in minutes  30   Pearson Grippe M.D on 05/13/2018 at 1:48 PM  Between 7am to 7pm - Pager - [phone]   After 7pm go to [email or web address] - password [facility]  [facility] -  Office  [phone]

## 2018-05-14 LAB — COMPREHENSIVE METABOLIC PANEL
ALT: 29 U/L (ref 14–54)
AST: 25 U/L (ref 15–41)
Albumin: 3.1 g/dL — ABNORMAL LOW (ref 3.5–5.0)
Alkaline Phosphatase: 43 U/L (ref 38–126)
Anion gap: 8 (ref 5–15)
BUN: 10 mg/dL (ref 6–20)
CALCIUM: 8.4 mg/dL — AB (ref 8.9–10.3)
CO2: 23 mmol/L (ref 22–32)
Chloride: 111 mmol/L (ref 101–111)
Creatinine, Ser: 0.63 mg/dL (ref 0.44–1.00)
GLUCOSE: 123 mg/dL — AB (ref 65–99)
Potassium: 3.4 mmol/L — ABNORMAL LOW (ref 3.5–5.1)
Sodium: 142 mmol/L (ref 135–145)
TOTAL PROTEIN: 5.6 g/dL — AB (ref 6.5–8.1)
Total Bilirubin: 0.3 mg/dL (ref 0.3–1.2)

## 2018-05-14 LAB — CBC
HCT: 30.8 % — ABNORMAL LOW (ref 36.0–46.0)
Hemoglobin: 10 g/dL — ABNORMAL LOW (ref 12.0–15.0)
MCH: 30.6 pg (ref 26.0–34.0)
MCHC: 32.5 g/dL (ref 30.0–36.0)
MCV: 94.2 fL (ref 78.0–100.0)
PLATELETS: 174 10*3/uL (ref 150–400)
RBC: 3.27 MIL/uL — ABNORMAL LOW (ref 3.87–5.11)
RDW: 12.7 % (ref 11.5–15.5)
WBC: 8.5 10*3/uL (ref 4.0–10.5)

## 2018-05-14 LAB — HIV ANTIBODY (ROUTINE TESTING W REFLEX): HIV SCREEN 4TH GENERATION: NONREACTIVE

## 2018-05-14 MED ORDER — IPRATROPIUM-ALBUTEROL 0.5-2.5 (3) MG/3ML IN SOLN
3.0000 mL | Freq: Three times a day (TID) | RESPIRATORY_TRACT | Status: DC
  Administered 2018-05-14: 3 mL via RESPIRATORY_TRACT
  Filled 2018-05-14 (×3): qty 3

## 2018-05-14 MED ORDER — IPRATROPIUM-ALBUTEROL 0.5-2.5 (3) MG/3ML IN SOLN: 3.0000 mL | RESPIRATORY_TRACT | Status: DC | PRN

## 2018-05-14 MED ORDER — PREDNISONE 20 MG PO TABS
40.0000 mg | ORAL_TABLET | Freq: Every day | ORAL | Status: DC
  Administered 2018-05-15: 40 mg via ORAL
  Filled 2018-05-14 (×2): qty 2

## 2018-05-14 NOTE — Progress Notes (Signed)
PROGRESS NOTE    TAMICKA SHIMON  ZOX:[id number] DOB: Dec 20, 1956 DOA: 05/11/2018 PCP: [name], MD    Brief Narrative:  62 year old female who presented with dyspnea, cough and fever. She does have a significant vascular history for asthma, dyslipidemia, and cognitive impairment. Patient reported 2 days history of worsening dyspnea, associated with a productive cough, chills and fever. Her oxygen saturation was found to be 82% on room air. She suffered from a near syncope episode in the waiting room her blood pressure was 85/58 mmHg. after IV fluids, blood pressure 97/61, 112 for 67, heart rate 70-82, respiratory rate 26, oxygen saturation 92%. Moist mucous membranes, heart S1-S2 present, rhythmic, no gallops, rubs or murmurs, her lungs had severely decreased air movement bilaterally. Her abdomen was soft nontender, no lower extremity edema. Arterial blood gas 7.40/35/63/22/92%, sodium 139, potassium 3.5, chloride 107, bicarbonate 2, glucose 160 commonly and 11, creatinine 0.79, white count 10.5, he'll 12.5, crit 37.6, platelets 210. Chest x-ray, left rotation, hyperinflation. Chest CT negative for pulmonary embolism, left lower lobe mucus plugging. EKG sinus tachycardia, normal axis, normal controls, inferior T-wave inversions, positive LVH, per EKG criteria.  Patient was admitted to the hospital working diagnosis of acute hypoxic respiratory failure due to asthma exacerbation.   Assessment & Plan:   Principal Problem:   Acute respiratory failure with hypoxia (HCC) Active Problems:   Asthma exacerbation   Sepsis (HCC)   HLD (hyperlipidemia)   COPD exacerbation (HCC)   1. Acute hypoxic respiratory failure due to asthma exacerbation. Today with improve ventilation, positive bilateral rhonchi and improved dyspnea. Will need nebulizer machine at discharge. Will continue bronchodilator therapy, oxymetry monitoring and supplemental 02 per [place]. Taper systemic steroids. Out of bed as  tolerated, physical therapy evaluation.   2. Hypotension. Continue blood pressure monitoring.   3. Dyslipidemia. Continue statin therapy.    DVT prophylaxis: enoxaparin   Code Status: full Family Communication: no family at the bedside  Disposition Plan: home in am if continue to impriove   Consultants:     Procedures:     Antimicrobials:       Subjective: Patient feeling better, dyspnea has improved but is not back to baseline, no nausea or vomiting, no chest pain. Lives with her sister.   Objective: Vitals:   05/13/18 2028 05/14/18 0027 05/14/18 0359 05/14/18 0800  BP:  126/89  (!) 134/91  Pulse:  (!) 58 (!) 51 69  Resp:  20 (!) 21 (!) 21  Temp:  98 F (36.7 C) 97.8 F (36.6 C) 98 F (36.7 C)  TempSrc:  Oral Oral Oral  SpO2: 99% 93% 96% 97%  Weight:      Height:        Intake/Output Summary (Last 24 hours) at 05/14/2018 0804 Last data filed at 05/14/2018 0600 Gross per 24 hour  Intake 3075 ml  Output 2000 ml  Net 1075 ml   Filed Weights   05/11/18 1419  Weight: 46.3 kg (102 lb)    Examination:   General: Not in pain or dyspnea, deconditioned  Neurology: Awake and alert, non focal  E ENT: no pallor, no icterus, oral mucosa moist Cardiovascular: No JVD. S1-S2 present, rhythmic, no gallops, rubs, or murmurs. No lower extremity edema. Pulmonary: positive breath sounds bilaterally, no wheezing, but scattered rhonchi and rales bilaterally. Gastrointestinal. Abdomen with no organomegaly, non tender, no rebound or guarding Skin. No rashes Musculoskeletal: no joint deformities     Data Reviewed: I have personally reviewed following labs and imaging  studies  CBC: Recent Labs  Lab 05/11/18 1334 05/13/18 0310 05/14/18 0337  WBC 10.5 10.6* 8.5  HGB 12.5 10.0* 10.0*  HCT 37.6 31.6* 30.8*  MCV 91.5 94.0 94.2  PLT 210 178 174   Basic Metabolic Panel: Recent Labs  Lab 05/11/18 1334 05/13/18 0310 05/14/18 0337  NA 139 141 142  K 3.5 3.8 3.4*    CL 107 113* 111  CO2 22 20* 23  GLUCOSE 160* 174* 123*  BUN CREATININE 0.79 0.79 0.63  CALCIUM 9.8 8.6* 8.4*   GFR: Estimated Creatinine Clearance: 54.7 mL/min (by C-G formula based on SCr of 0.63 mg/dL). Liver Function Tests: Recent Labs  Lab 05/11/18 1928 05/13/18 0310 05/14/18 0337  AST ALT ALKPHOS 60 54 43  BILITOT 1.6* 0.3 0.3  PROT 6.9 5.6* 5.6*  ALBUMIN 4.2 3.2* 3.1*   No results for input(s): LIPASE, AMYLASE in the last 168 hours. No results for input(s): AMMONIA in the last 168 hours. Coagulation Profile: No results for input(s): INR, PROTIME in the last 168 hours. Cardiac Enzymes: Recent Labs  Lab 05/11/18 1928  TROPONINI <0.03   BNP (last 3 results) No results for input(s): PROBNP in the last 8760 hours. HbA1C: No results for input(s): HGBA1C in the last 72 hours. CBG: No results for input(s): GLUCAP in the last 168 hours. Lipid Profile: Recent Labs    05/12/18 0322  CHOL 182  HDL 71  LDLCALC 105*  TRIG 29  CHOLHDL 2.6   Thyroid Function Tests: No results for input(s): TSH, T4TOTAL, FREET4, T3FREE, THYROIDAB in the last 72 hours. Anemia Panel: No results for input(s): VITAMINB12, FOLATE, FERRITIN, TIBC, IRON, RETICCTPCT in the last 72 hours.    Radiology Studies: I have reviewed all of the imaging during this hospital visit personally     Scheduled Meds: . azithromycin  500 mg Oral Q24H  . enoxaparin (LOVENOX) injection  30 mg Subcutaneous Q24H  . ipratropium  0.5 mg Nebulization TID  . levalbuterol  1.25 mg Nebulization TID  . predniSONE  60 mg Oral Q breakfast   Continuous Infusions: . sodium chloride 125 mL/hr at 05/14/18 0637  . cefTRIAXone (ROCEPHIN)  IV Stopped (05/13/18 1525)     LOS: 3 days        Alayzha An Annett Gula, MD [facility] Pager [phone]

## 2018-05-14 NOTE — Care Management Important Message (Signed)
Important Message  Patient Details  Name: ISAMAR NAZIR MRN: [id number] Date of Birth: Sep 08, 1956   Medicare Important Message Given:  Yes    Leone Haven, RN 05/14/2018, 4:36 PM

## 2018-05-15 ENCOUNTER — Telehealth: Payer: Self-pay | Admitting: Internal Medicine

## 2018-05-15 MED ORDER — IPRATROPIUM-ALBUTEROL 0.5-2.5 (3) MG/3ML IN SOLN: 3.0000 mL | Freq: Four times a day (QID) | RESPIRATORY_TRACT | 0 refills | Status: DC | PRN

## 2018-05-15 MED ORDER — ALUM & MAG HYDROXIDE-SIMETH 200-200-20 MG/5ML PO SUSP
15.0000 mL | ORAL | Status: DC | PRN
  Administered 2018-05-15: 15 mL via ORAL
  Filled 2018-05-15 (×2): qty 30

## 2018-05-15 MED ORDER — POLYETHYLENE GLYCOL 3350 17 G PO PACK
17.0000 g | PACK | Freq: Every day | ORAL | Status: DC
  Administered 2018-05-15: 17 g via ORAL
  Filled 2018-05-15: qty 1

## 2018-05-15 NOTE — Discharge Summary (Signed)
Physician Discharge Summary  [name] [name] JXB:[id number] DOB: 08-12-1956 DOA: 05/11/2018  PCP: [name], MD  Admit date: 05/11/2018 Discharge date: 05/15/2018  Admitted From: Home  Disposition:  Home   Recommendations for Outpatient Follow-up and new medication changes:  1. Follow up with Dr. Okey Dupre in 1- week 2. Patient placed on as needed albuterol and ipratropium   Home Health: No   Equipment/Devices: Nebulizer machine    Discharge Condition: stable  CODE STATUS: full   Diet recommendation: Regular.   Brief/Interim Summary: 62 year old female who presented with dyspnea, cough and fever. She does have a significant past medical history for asthma, dyslipidemia, and cognitive impairment. Patient reported 2 days history of worsening dyspnea, associated with a productive cough, chills and fever. Her oxygen saturation was found to be 82% on room air. She suffered from a near syncope episode in the waiting room, when her blood pressure was 85/58 mmHg. After IV fluids, her blood pressure improved 97/61, 112 /67, heart rate 70-82, respiratory rate 26, oxygen saturation 92%. Moist mucous membranes, heart S1-S2 present, rhythmic, no gallops, rubs or murmurs, her lungs had severely decreased air movement bilaterally. Her abdomen was soft nontender, no lower extremity edema. Arterial blood gas 7.40/35/63/22/92%, sodium 139, potassium 3.5, chloride 107, bicarbonate 22, glucose 160. BUN 11, creatinine 0.79, white count 10.5, Hb 12.5, Hct 37.6, platelets 210. Chest x-ray, left rotation, hyperinflation. Chest CT negative for pulmonary embolism, left lower lobe mucus plugging. EKG sinus tachycardia, normal axis, normal intervals, inferior T-wave inversions, positive LVH, per EKG criteria.  Patient was admitted to the hospital with the working diagnosis of acute hypoxic respiratory failure due to asthma exacerbation.  1.  Acute hypoxic respiratory failure due to asthma exacerbation.   Patient was admitted to the stepdown unit, she was placed on aggressive bronchodilator therapy, and systemic steroids with improvement of her symptoms.  She completed 5 days of steroids, she has been ambulating, and her oxygen saturation at discharge is 99% on room air.  Patient will continue using as needed DuoNeb, and nebulizer machine has been prescribed.  Continue Symbicort.  2.  Hypotension.  Likely volume related, she ruled out for sepsis, responded well to IV fluids, her discharge systolic blood pressure 128 to 140 mmHg.  3.  Dyslipidemia.  Continue statin therapy.    Discharge Diagnoses:  Principal Problem:   Acute respiratory failure with hypoxia (HCC) Active Problems:   Asthma exacerbation   Sepsis (HCC)   HLD (hyperlipidemia)   COPD exacerbation (HCC)    Discharge Instructions   Allergies as of 05/15/2018   No Known Allergies     Medication List    TAKE these medications   albuterol 108 (90 Base) MCG/ACT inhaler Commonly known as:  PROVENTIL HFA;VENTOLIN HFA Inhale 2 puffs into the lungs every 4 (four) hours as needed for wheezing or shortness of breath.   budesonide-formoterol 160-4.5 MCG/ACT inhaler Commonly known as:  SYMBICORT Inhale 2 puffs into the lungs 2 (two) times daily. What changed:    when to take this  reasons to take this   ipratropium-albuterol 0.5-2.5 (3) MG/3ML Soln Commonly known as:  DUONEB Take 3 mLs by nebulization every 6 (six) hours as needed (as needed for shortness of breath.).   simvastatin 20 MG tablet Commonly known as:  ZOCOR Take 1 tablet (20 mg total) by mouth at bedtime.            Durable Medical Equipment  (From admission, onward)  Start     Ordered   05/15/18 0000  DME Nebulizer machine    Question:  Patient needs a nebulizer to treat with the following condition  Answer:  Asthma   05/15/18 1003      No Known Allergies  Consultations:     Procedures/Studies: Ct Angio Chest Pe W Or Wo  Contrast  Result Date: 05/11/2018 CLINICAL DATA:  62 year old female with acute chest pain and shortness of breath today. EXAM: CT ANGIOGRAPHY CHEST WITH CONTRAST TECHNIQUE: Multidetector CT imaging of the chest was performed using the standard protocol during bolus administration of intravenous contrast. Multiplanar CT image reconstructions and MIPs were obtained to evaluate the vascular anatomy. CONTRAST:  ISOVUE-370 IOPAMIDOL (ISOVUE-370) INJECTION 76% COMPARISON:  05/11/2018 and prior chest radiographs. 11/19/2004 chest CT FINDINGS: Cardiovascular: This is a technically satisfactory study. No pulmonary emboli are identified. UPPER limits normal heart size noted. Mild coronary artery and thoracic aortic atherosclerotic calcifications are noted. There is no evidence of thoracic aortic aneurysm or definite dissection. No pericardial effusion identified. Mediastinum/Nodes: No enlarged mediastinal, hilar, or axillary lymph nodes. Thyroid gland, trachea, and esophagus demonstrate no significant findings. Lungs/Pleura: LEFT basilar atelectasis noted. Mucous filling LEFT LOWER lobe bronchi noted. There is no evidence of airspace disease, consolidation, mass, nodule, pleural effusion or pneumothorax. Upper Abdomen: No acute abnormality Musculoskeletal: No acute or suspicious bony abnormalities. Review of the MIP images confirms the above findings. IMPRESSION: 1. No evidence of pulmonary emboli or thoracic aortic aneurysm/definite dissection. 2. LEFT LOWER lobe mucous plugging with LEFT basilar atelectasis. 3. Coronary artery and Aortic Atherosclerosis (ICD10-I70.0). Electronically Signed   By: Harmon Pier M.D.   On: 05/11/2018 18:13   Dg Chest Portable 1 View  Result Date: 05/11/2018 CLINICAL DATA:  Shortness of breath EXAM: PORTABLE CHEST 1 VIEW COMPARISON:  03/20/2016 FINDINGS: Cardiac shadow is within normal limits. Lungs are well aerated bilaterally. Previously seen infiltrative changes have resolved. No  acute bony abnormality is noted. IMPRESSION: No acute abnormality noted. Electronically Signed   By: Alcide Clever M.D.   On: 05/11/2018 15:02       Subjective: Patient is feeling better, dyspnea has resolved, she is back to her baseline, no nausea or vomiting, ambulating.   Discharge Exam: Vitals:   05/15/18 0300 05/15/18 0751  BP: 128/86 (!) 140/99  Pulse: 68 66  Resp: 18 14  Temp: 98 F (36.7 C) 98.1 F (36.7 C)  SpO2: 99% 97%   Vitals:   05/14/18 2255 05/14/18 2300 05/15/18 0300 05/15/18 0751  BP:  (!) 138/91 128/86 (!) 140/99  Pulse:  62 68 66  Resp:  Temp:  98.4 F (36.9 C) 98 F (36.7 C) 98.1 F (36.7 C)  TempSrc:  Oral Oral Oral  SpO2: 97% 98% 99% 97%  Weight:      Height:        General: Not in pain or dyspnea Neurology: Awake and alert, non focal  E ENT: mild pallor, no icterus, oral mucosa moist Cardiovascular: No JVD. S1-S2 present, rhythmic, no gallops, rubs, or murmurs. No lower extremity edema. Pulmonary: vesicular breath sounds bilaterally, adequate air movement, no wheezing, rhonchi or rales. Gastrointestinal. Abdomen with no organomegaly, non tender, no rebound or guarding Skin. No rashes Musculoskeletal: no joint deformities   The results of significant diagnostics from this hospitalization (including imaging, microbiology, ancillary and laboratory) are listed below for reference.     Microbiology: Recent Results (from the past 240 hour(s))  Blood Culture (routine x  2)     Status: None (Preliminary result)   Collection Time: 05/11/18  2:20 PM  Result Value Ref Range Status   Specimen Description BLOOD RIGHT ANTECUBITAL  Final   Special Requests   Final    BOTTLES DRAWN AEROBIC AND ANAEROBIC Blood Culture adequate volume   Culture   Final    NO GROWTH 3 DAYS Performed at [facility], [address] [place], [place] [address]    Report Status PENDING  Incomplete  Blood Culture (routine x 2)     Status: None (Preliminary  result)   Collection Time: 05/11/18  2:36 PM  Result Value Ref Range Status   Specimen Description BLOOD BLOOD RIGHT WRIST  Final   Special Requests   Final    BOTTLES DRAWN AEROBIC AND ANAEROBIC Blood Culture results may not be optimal due to an inadequate volume of blood received in culture bottles   Culture   Final    NO GROWTH 3 DAYS Performed at [facility], [address] [place], [place] [address]    Report Status PENDING  Incomplete  Respiratory Panel by PCR     Status: Abnormal   Collection Time: 05/11/18 11:50 PM  Result Value Ref Range Status   Adenovirus NOT DETECTED NOT DETECTED Final   Coronavirus 229E NOT DETECTED NOT DETECTED Final   Coronavirus HKU1 NOT DETECTED NOT DETECTED Final   Coronavirus NL63 NOT DETECTED NOT DETECTED Final   Coronavirus OC43 NOT DETECTED NOT DETECTED Final   Metapneumovirus NOT DETECTED NOT DETECTED Final   Rhinovirus / Enterovirus DETECTED (A) NOT DETECTED Final   Influenza A NOT DETECTED NOT DETECTED Final   Influenza B NOT DETECTED NOT DETECTED Final   Parainfluenza Virus 1 NOT DETECTED NOT DETECTED Final   Parainfluenza Virus 2 NOT DETECTED NOT DETECTED Final   Parainfluenza Virus 3 NOT DETECTED NOT DETECTED Final   Parainfluenza Virus 4 NOT DETECTED NOT DETECTED Final   Respiratory Syncytial Virus NOT DETECTED NOT DETECTED Final   Bordetella pertussis NOT DETECTED NOT DETECTED Final   Chlamydophila pneumoniae NOT DETECTED NOT DETECTED Final   Mycoplasma pneumoniae NOT DETECTED NOT DETECTED Final  Urine culture     Status: Abnormal   Collection Time: 05/12/18 12:17 AM  Result Value Ref Range Status   Specimen Description URINE, CLEAN CATCH  Final   Special Requests NONE  Final   Culture (A)  Final    <10,000 COLONIES/mL INSIGNIFICANT GROWTH Performed at [facility], [address] [place], [place] [address]    Report Status 05/13/2018 FINAL  Final  MRSA PCR Screening     Status: None   Collection Time: 05/13/18  12:46 AM  Result Value Ref Range Status   MRSA by PCR NEGATIVE NEGATIVE Final    Comment:        The GeneXpert MRSA Assay (FDA approved for NASAL specimens only), is one component of a comprehensive MRSA colonization surveillance program. It is not intended to diagnose MRSA infection nor to guide or monitor treatment for MRSA infections. Performed at [facility], [address], [place], [place] [address]      Labs: BNP (last 3 results) No results for input(s): BNP in the last 8760 hours. Basic Metabolic Panel: Recent Labs  Lab 05/11/18 1334 05/13/18 0310 05/14/18 0337  NA 139 141 142  K 3.5 3.8 3.4*  CL 107 113* 111  CO2 22 20* 23  GLUCOSE 160* 174* 123*  BUN 11 15  10  CREATININE 0.79 0.79 0.63  CALCIUM 9.8 8.6* 8.4*   Liver Function Tests: Recent Labs  Lab 05/11/18 1928 05/13/18 0310 05/14/18 0337  AST ALT ALKPHOS 60 54 43  BILITOT 1.6* 0.3 0.3  PROT 6.9 5.6* 5.6*  ALBUMIN 4.2 3.2* 3.1*   No results for input(s): LIPASE, AMYLASE in the last 168 hours. No results for input(s): AMMONIA in the last 168 hours. CBC: Recent Labs  Lab 05/11/18 1334 05/13/18 0310 05/14/18 0337  WBC 10.5 10.6* 8.5  HGB 12.5 10.0* 10.0*  HCT 37.6 31.6* 30.8*  MCV 91.5 94.0 94.2  PLT 210 178 174   Cardiac Enzymes: Recent Labs  Lab 05/11/18 1928  TROPONINI <0.03   BNP: Invalid input(s): POCBNP CBG: No results for input(s): GLUCAP in the last 168 hours. D-Dimer No results for input(s): DDIMER in the last 72 hours. Hgb A1c No results for input(s): HGBA1C in the last 72 hours. Lipid Profile No results for input(s): CHOL, HDL, LDLCALC, TRIG, CHOLHDL, LDLDIRECT in the last 72 hours. Thyroid function studies No results for input(s): TSH, T4TOTAL, T3FREE, THYROIDAB in the last 72 hours.  Invalid input(s): FREET3 Anemia work up No results for input(s): VITAMINB12, FOLATE, FERRITIN, TIBC, IRON, RETICCTPCT in the last 72 hours. Urinalysis     Component Value Date/Time   COLORURINE STRAW (A) 05/12/2018 0017   APPEARANCEUR CLEAR 05/12/2018 0017   LABSPEC 1.009 05/12/2018 0017   PHURINE 6.0 05/12/2018 0017   GLUCOSEU >=500 (A) 05/12/2018 0017   HGBUR SMALL (A) 05/12/2018 0017   BILIRUBINUR NEGATIVE 05/12/2018 0017   KETONESUR NEGATIVE 05/12/2018 0017   PROTEINUR NEGATIVE 05/12/2018 0017   UROBILINOGEN 0.2 12/07/2014 0513   NITRITE NEGATIVE 05/12/2018 0017   LEUKOCYTESUR NEGATIVE 05/12/2018 0017   Sepsis Labs Invalid input(s): PROCALCITONIN,  WBC,  LACTICIDVEN Microbiology Recent Results (from the past 240 hour(s))  Blood Culture (routine x 2)     Status: None (Preliminary result)   Collection Time: 05/11/18  2:20 PM  Result Value Ref Range Status   Specimen Description BLOOD RIGHT ANTECUBITAL  Final   Special Requests   Final    BOTTLES DRAWN AEROBIC AND ANAEROBIC Blood Culture adequate volume   Culture   Final    NO GROWTH 3 DAYS Performed at [facility], [address], [place], [place] [address]    Report Status PENDING  Incomplete  Blood Culture (routine x 2)     Status: None (Preliminary result)   Collection Time: 05/11/18  2:36 PM  Result Value Ref Range Status   Specimen Description BLOOD BLOOD RIGHT WRIST  Final   Special Requests   Final    BOTTLES DRAWN AEROBIC AND ANAEROBIC Blood Culture results may not be optimal due to an inadequate volume of blood received in culture bottles   Culture   Final    NO GROWTH 3 DAYS Performed at [facility], [address] [place], [place] [address]    Report Status PENDING  Incomplete  Respiratory Panel by PCR     Status: Abnormal   Collection Time: 05/11/18 11:50 PM  Result Value Ref Range Status   Adenovirus NOT DETECTED NOT DETECTED Final   Coronavirus 229E NOT DETECTED NOT DETECTED Final   Coronavirus HKU1 NOT DETECTED NOT DETECTED Final   Coronavirus NL63 NOT DETECTED NOT DETECTED Final   Coronavirus OC43 NOT DETECTED NOT DETECTED Final    Metapneumovirus NOT DETECTED NOT DETECTED Final   Rhinovirus /  Enterovirus DETECTED (A) NOT DETECTED Final   Influenza A NOT DETECTED NOT DETECTED Final   Influenza B NOT DETECTED NOT DETECTED Final   Parainfluenza Virus 1 NOT DETECTED NOT DETECTED Final   Parainfluenza Virus 2 NOT DETECTED NOT DETECTED Final   Parainfluenza Virus 3 NOT DETECTED NOT DETECTED Final   Parainfluenza Virus 4 NOT DETECTED NOT DETECTED Final   Respiratory Syncytial Virus NOT DETECTED NOT DETECTED Final   Bordetella pertussis NOT DETECTED NOT DETECTED Final   Chlamydophila pneumoniae NOT DETECTED NOT DETECTED Final   Mycoplasma pneumoniae NOT DETECTED NOT DETECTED Final  Urine culture     Status: Abnormal   Collection Time: 05/12/18 12:17 AM  Result Value Ref Range Status   Specimen Description URINE, CLEAN CATCH  Final   Special Requests NONE  Final   Culture (A)  Final    <10,000 COLONIES/mL INSIGNIFICANT GROWTH Performed at [facility], [address] [place], [place] [address]    Report Status 05/13/2018 FINAL  Final  MRSA PCR Screening     Status: None   Collection Time: 05/13/18 12:46 AM  Result Value Ref Range Status   MRSA by PCR NEGATIVE NEGATIVE Final    Comment:        The GeneXpert MRSA Assay (FDA approved for NASAL specimens only), is one component of a comprehensive MRSA colonization surveillance program. It is not intended to diagnose MRSA infection nor to guide or monitor treatment for MRSA infections. Performed at [facility], [address] [place], [place] [address]      Time coordinating discharge: 45 minutes  SIGNED:   Coralie Keens, MD  [facility] 05/15/2018, 9:05 AM Pager [phone]  If 7PM-7AM, please contact night-coverage [email or web address] Password [email or web address]

## 2018-05-15 NOTE — Telephone Encounter (Signed)
Copied from CRM [id number]. Topic: Quick Communication - See Telephone Encounter >> May 15, 2018  3:37 PM Louie Bun, Rosey Bath D wrote: CRM for notification. See Telephone encounter for: 05/15/18. Lorain from [employer] called and would like to talk to Kinde or Dr. Okey Dupre about a referral to be sent to them for continue or care. Also she said that patient needs a hospital f/u with provider but the number that is in the system is not a working number for patient right now. If there are any questions she can be reached at [phone].

## 2018-05-15 NOTE — Progress Notes (Signed)
Patient reported she is unable to get a hold of her sister and requires transport. Staff feel she is unsafe for a bus. CSW will provide taxi voucher.   CSW signing off.  Osborne Casco Ekaterini Capitano LCSW [phone]

## 2018-05-15 NOTE — Progress Notes (Signed)
Reviewed discharge instructions with patient. Removed IV from right forearm with band-aid placed. No questions noted. Patient is alert x4 with VS stable. Patient is waiting for sister to provide transportation home.

## 2018-05-15 NOTE — Telephone Encounter (Signed)
FYI

## 2018-05-15 NOTE — Consult Note (Signed)
[facility] CM Primary Care Navigator  05/15/2018  [name] [name] [birth year] [id number]   Met with patientat the bedside to identify possible discharge needs. Patient reportshaving "productive coughing, shortness of breath, weakness, and fever" that had ledto this admission.  Patient endorsesDr. Pricilla [name] with [place] [facility] as herprimary care provider.   [facility] [place].   Patientreportsmanaginghermedications at home with sister's assistance, straight out of the containers.  Patientverbalizedthat sister [name] [name]) provides transportationto her doctors'appointments.Patient was made aware of [employer] transportation benefits.  Patient statesthatshe lives with sister who serves as her primary caregiver at home.  Anticipated discharge plan ishomeper patient.  Patientvoiced understanding to call primary care provider's officewhen she gets homefor a post discharge follow-up appointment within1- 2 weeksor sooner if needs arise.Patient letter (with PCP's contact number) wasprovided as a reminder.Patient states that she will be able to use neighbor's phone if needed because she does not have a working phone at the moment.  Per chart review, patient has significant past medical history of asthma, dyslipidemia,andmild intellectual disabilities. Patient was admitted to the hospital with working diagnosis of acute hypoxic respiratory failure due to asthma exacerbation; COPD exacerbation, sepsis and hyperlipidemia.  Patient verbalized needing further information/ education in managingasthma/ COPD. She is not awareof thezones/ tool and Action plan as well as the signs and symptoms ofasthma/ COPDthat needs medical assistance.  Explained to patientregardingTHN CM services available for healthmanagement and resources at home.Patient  expressedwillingness and interest to get help in managing chronic health issues after discharge, but has no working phone to use until June, when she will get her money to reconnect back her phone services at home.  Primary care provider's office is listed as doing transition of care (TOC) follow-up. Primary care provider's office called [name] [name] for [name] [name]) to notify of patient's discharge and need for post hospital follow-up and transition of care. Notified of health issues needing follow-up (mainly asthma/ COPD exacerbation).  Made aware to refer patient to [facility] care management as deemed necessary and appropriate for services, on her next follow-up visit since she does not have a working phone to contact her until June.  THNcare managementinformation provided to patient for future needs that she may have.    For additional questions please contact:  [name] [name], BSN, RN-BC [facility] PRIMARY CARE Navigator Cell: [phone]

## 2018-05-15 NOTE — Progress Notes (Signed)
PT Cancellation Note  Patient Details Name: [name] [name] MRN: [id number] DOB: [birth year]   Cancelled Treatment:    Reason Eval/Treat Not Completed: (P) Other (comment)(RN screened pt with no PT needs identified. ) PT signing off  Lanora Manis B. Beverely Risen PT, DPT Acute Rehabilitation  [phone] Pager [phone]   Elon Alas Fleet 05/15/2018, 11:34 AM

## 2018-05-15 NOTE — Care Management Note (Signed)
Case Management Note  Patient Details  Name: ALECE KOPPEL MRN: [id number] Date of Birth: 1956-10-05  Subjective/Objective:     From home, pta indep, for dc today, will need neb machine, patient would like to go thru [facility], referral made to [facility] with [facility] for neb machine, she will bring to patient's room prior to dc.               Action/Plan: DC home when neb machine has been delivered.  Expected Discharge Date:  05/15/18               Expected Discharge Plan:  Home/Self Care  In-House Referral:  NA  Discharge planning Services  CM Consult  Post Acute Care Choice:  Durable Medical Equipment Choice offered to:  Patient  DME Arranged:  Nebulizer machine DME Agency:  [facility]  HH Arranged:  NA HH Agency:  NA  Status of Service:  Completed, signed off  If discussed at Long Length of Stay Meetings, dates discussed:    Additional Comments:  Leone Haven, RN 05/15/2018, 10:25 AM

## 2018-05-15 NOTE — Telephone Encounter (Signed)
Copied from CRM [id number]. Topic: Quick Communication - See Telephone Encounter >> May 15, 2018 11:41 AM Lorrine Kin, NT wrote: CRM for notification. See Telephone encounter for: 05/15/18. Nedra Hai, from [place], calling to let Dr Okey Dupre know that her patient was admitted on 05/11/18 for sepsis. Patient is still inpatient at this time.

## 2018-05-16 ENCOUNTER — Encounter: Payer: Self-pay | Admitting: *Deleted

## 2018-05-16 LAB — CULTURE, BLOOD (ROUTINE X 2)
CULTURE: NO GROWTH
CULTURE: NO GROWTH
Special Requests: ADEQUATE

## 2018-05-16 NOTE — Telephone Encounter (Signed)
Pt was not on TCM list, but according to D/C summary admitted to hosp 05/11/18 for acute hypoxic respiratory failure due to asthma. She was inform to f/u w/PCP after discharge on 05/15/18. # on file has been disconnected, and emergency contact person number is the same. Pt has not seen MD since 11/09/16. Will mail out letter to patient to call to schedule hosp f/u appt ASAP.Marland KitchenRaechel Chute

## 2018-06-07 DIAGNOSIS — F6381 Intermittent explosive disorder: Secondary | ICD-10-CM | POA: Diagnosis not present

## 2018-06-08 ENCOUNTER — Ambulatory Visit (AMBULATORY_SURGERY_CENTER): Payer: Self-pay

## 2018-06-08 ENCOUNTER — Encounter: Payer: Self-pay | Admitting: Gastroenterology

## 2018-06-08 VITALS — Ht 61.5 in | Wt 109.2 lb

## 2018-06-08 DIAGNOSIS — Z800 Family history of malignant neoplasm of digestive organs: Secondary | ICD-10-CM

## 2018-06-08 MED ORDER — PEG 3350-KCL-NA BICARB-NACL 420 G PO SOLR: 4000.0000 mL | Freq: Once | ORAL | 0 refills | Status: AC

## 2018-06-08 NOTE — Progress Notes (Signed)
Per pt, no allergies to soy or egg products.Pt not taking any weight loss meds or using  O2 at home.  Pt refused emmi video. 

## 2018-06-15 DIAGNOSIS — J45909 Unspecified asthma, uncomplicated: Secondary | ICD-10-CM | POA: Diagnosis not present

## 2018-06-15 DIAGNOSIS — R062 Wheezing: Secondary | ICD-10-CM | POA: Diagnosis not present

## 2018-06-22 ENCOUNTER — Ambulatory Visit (AMBULATORY_SURGERY_CENTER): Payer: Medicare HMO | Admitting: Gastroenterology

## 2018-06-22 ENCOUNTER — Other Ambulatory Visit: Payer: Self-pay

## 2018-06-22 ENCOUNTER — Encounter: Payer: Self-pay | Admitting: Gastroenterology

## 2018-06-22 VITALS — BP 109/73 | HR 62 | Temp 99.6°F | Resp 14 | Ht 61.0 in | Wt 109.0 lb

## 2018-06-22 DIAGNOSIS — Z1211 Encounter for screening for malignant neoplasm of colon: Secondary | ICD-10-CM

## 2018-06-22 DIAGNOSIS — Z800 Family history of malignant neoplasm of digestive organs: Secondary | ICD-10-CM

## 2018-06-22 MED ORDER — SODIUM CHLORIDE 0.9 % IV SOLN: 500.0000 mL | Freq: Once | INTRAVENOUS | Status: DC

## 2018-06-22 NOTE — Progress Notes (Signed)
Upon discharge it was noted that client had her right lower extremity wrapped in saran wrap and an ingrown toenail. When asked about her leg, she said it keeps breaking out in a rash.

## 2018-06-22 NOTE — Patient Instructions (Signed)
YOU HAD AN ENDOSCOPIC PROCEDURE TODAY AT THE [facility]:   Refer to the procedure report that was given to you for any specific questions about what was found during the examination.  If the procedure report does not answer your questions, please call your gastroenterologist to clarify.  If you requested that your care partner not be given the details of your procedure findings, then the procedure report has been included in a sealed envelope for you to review at your convenience later.  YOU SHOULD EXPECT: Some feelings of bloating in the abdomen. Passage of more gas than usual.  Walking can help get rid of the air that was put into your GI tract during the procedure and reduce the bloating. If you had a lower endoscopy (such as a colonoscopy or flexible sigmoidoscopy) you may notice spotting of blood in your stool or on the toilet paper. If you underwent a bowel prep for your procedure, you may not have a normal bowel movement for a few days.  Please Note:  You might notice some irritation and congestion in your nose or some drainage.  This is from the oxygen used during your procedure.  There is no need for concern and it should clear up in a day or so.  SYMPTOMS TO REPORT IMMEDIATELY:   Following lower endoscopy (colonoscopy or flexible sigmoidoscopy):  Excessive amounts of blood in the stool  Significant tenderness or worsening of abdominal pains  Swelling of the abdomen that is new, acute  Fever of 100F or higher  For urgent or emergent issues, a gastroenterologist can be reached at any hour by calling [phone].   DIET:  We do recommend a small meal at first, but then you may proceed to your regular diet.  Drink plenty of fluids but you should avoid alcoholic beverages for 24 hours.  ACTIVITY:  You should plan to take it easy for the rest of today and you should NOT DRIVE or use heavy machinery until tomorrow (because of the sedation medicines used during the test).     FOLLOW UP: Our staff will call the number listed on your records the next business day following your procedure to check on you and address any questions or concerns that you may have regarding the information given to you following your procedure. If we do not reach you, we will leave a message.  However, if you are feeling well and you are not experiencing any problems, there is no need to return our call.  We will assume that you have returned to your regular daily activities without incident.  If any biopsies were taken you will be contacted by phone or by letter within the next 1-3 weeks.  Please call us at [phone] if you have not heard about the biopsies in 3 weeks.    SIGNATURES/CONFIDENTIALITY: You and/or your care partner have signed paperwork which will be entered into your electronic medical record.  These signatures attest to the fact that that the information above on your After Visit Summary has been reviewed and is understood.  Full responsibility of the confidentiality of this discharge information lies with you and/or your care-partner. 

## 2018-06-22 NOTE — Progress Notes (Signed)
I have reviewed the patient's medical history in detail and updated the computerized patient record.

## 2018-06-22 NOTE — Progress Notes (Signed)
To PACU, VSS. Report to [email or web address] 

## 2018-06-22 NOTE — Op Note (Signed)
[facility] Patient Name: [name] [name] Procedure Date: 06/22/2018 10:52 AM MRN: [id number] Endoscopist: Rachael Fee , MD Age: 62 Referring MD:  Date of Birth: May 02, 1956 Gender: Female Account #: [id number] Procedure:                Colonoscopy Indications:              Screening for colorectal malignant neoplasm; father                            had colon cancer, diagnosed in his 44s or 71s Medicines:                Monitored Anesthesia Care Procedure:                Pre-Anesthesia Assessment:                           - Prior to the procedure, a History and Physical                            was performed, and patient medications and                            allergies were reviewed. The patient's tolerance of                            previous anesthesia was also reviewed. The risks                            and benefits of the procedure and the sedation                            options and risks were discussed with the patient.                            All questions were answered, and informed consent                            was obtained. Prior Anticoagulants: The patient has                            taken no previous anticoagulant or antiplatelet                            agents. ASA Grade Assessment: II - A patient with                            mild systemic disease. After reviewing the risks                            and benefits, the patient was deemed in                            satisfactory condition to undergo the procedure.  After obtaining informed consent, the colonoscope                            was passed under direct vision. Throughout the                            procedure, the patient's blood pressure, pulse, and                            oxygen saturations were monitored continuously. The                            Colonoscope was introduced through the anus and                            advanced to  the the cecum, identified by                            appendiceal orifice and ileocecal valve. The                            colonoscopy was performed without difficulty. The                            patient tolerated the procedure well. The quality                            of the bowel preparation was adequate. The                            ileocecal valve, appendiceal orifice, and rectum                            were photographed. Scope In: 10:57:32 AM Scope Out: 11:09:43 AM Scope Withdrawal Time: 0 hours 6 minutes 48 seconds  Total Procedure Duration: 0 hours 12 minutes 11 seconds  Findings:                 The entire examined colon appeared normal on direct                            and retroflexion views. Complications:            No immediate complications. Estimated blood loss:                            None. Estimated Blood Loss:     Estimated blood loss: none. Impression:               - The entire examined colon is normal on direct and                            retroflexion views.                           - No polyps or cancers. Recommendation:           -  Patient has a contact number available for                            emergencies. The signs and symptoms of potential                            delayed complications were discussed with the                            patient. Return to normal activities tomorrow.                            Written discharge instructions were provided to the                            patient.                           - Resume previous diet.                           - Continue present medications.                           - Repeat colonoscopy in 10 years for screening. Rachael Fee, MD 06/22/2018 11:14:25 AM This report has been signed electronically.

## 2018-06-25 ENCOUNTER — Telehealth: Payer: Self-pay

## 2018-06-25 NOTE — Telephone Encounter (Signed)
Post procedure call, phone number given was disconnected.

## 2018-07-09 DIAGNOSIS — F6381 Intermittent explosive disorder: Secondary | ICD-10-CM | POA: Diagnosis not present

## 2018-07-10 IMAGING — CT CT ANGIO CHEST
2 of 6 series · 18 of 36 positions shown · IV contrast (Omni 300)
Comparison: 05/11/2018 and prior chest radiographs. 11/19/2004
chest CT

CLINICAL DATA: 62-year-old female with acute chest pain and
shortness of breath today.

EXAM:
CT ANGIOGRAPHY CHEST WITH CONTRAST
TECHNIQUE: Multidetector CT imaging of the chest was performed using the
standard protocol during bolus administration of intravenous
contrast. Multiplanar CT image reconstructions and MIPs were
obtained to evaluate the vascular anatomy.
CONTRAST:  100mL VOXZ8V-MUT IOPAMIDOL (VOXZ8V-MUT) INJECTION 76%

[Series 7: pe thins · axial · 0.61mm/px · z∈[+1305,+1573]mm · 17 of 302 slices shown]
[im 17/302  lung]
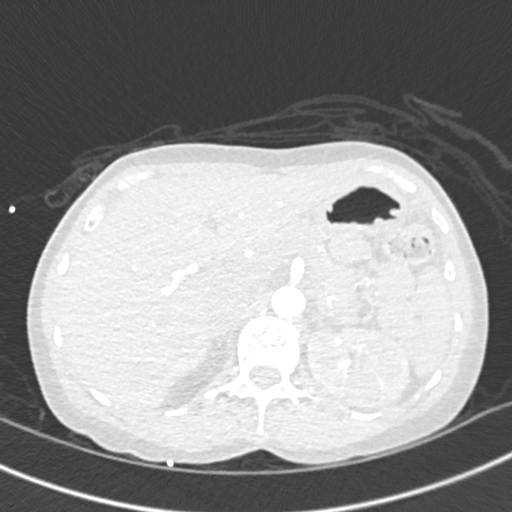
[im 34/302  mediastinal]
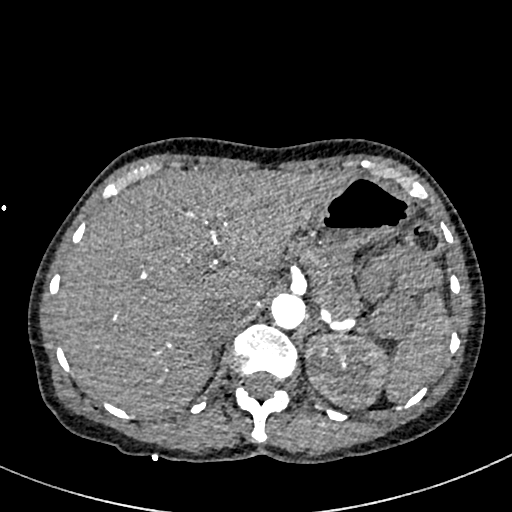
[im 51/302  lung]
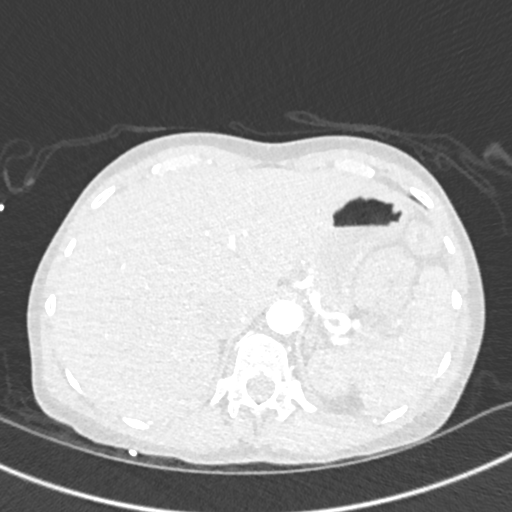
[im 67/302  mediastinal]
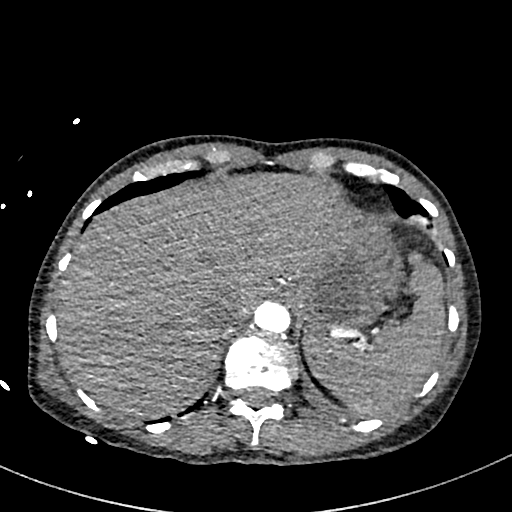
[im 84/302  lung]
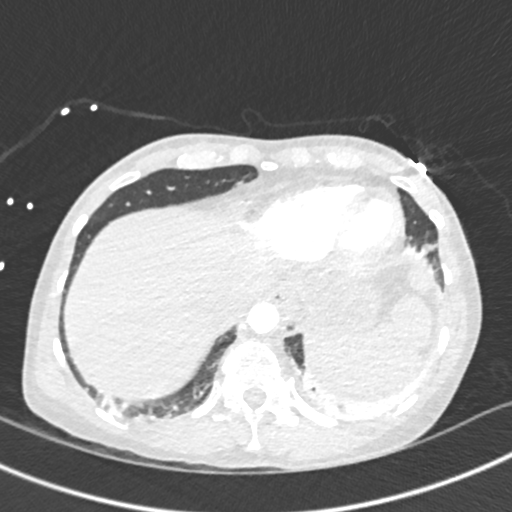
[im 101/302  mediastinal]
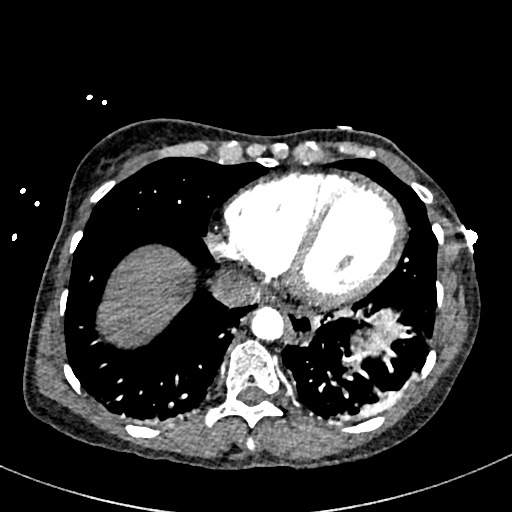
[im 118/302  lung]
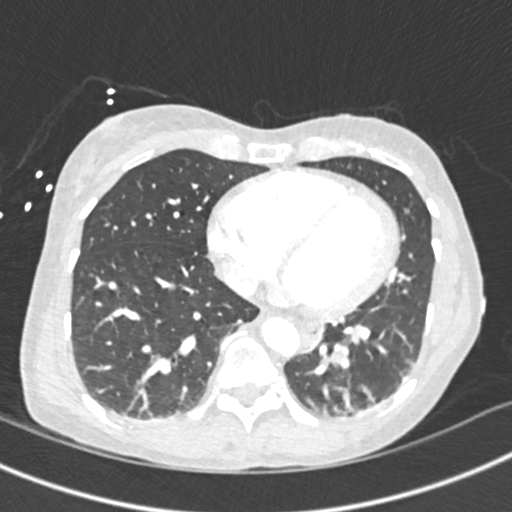
[im 134/302  mediastinal]
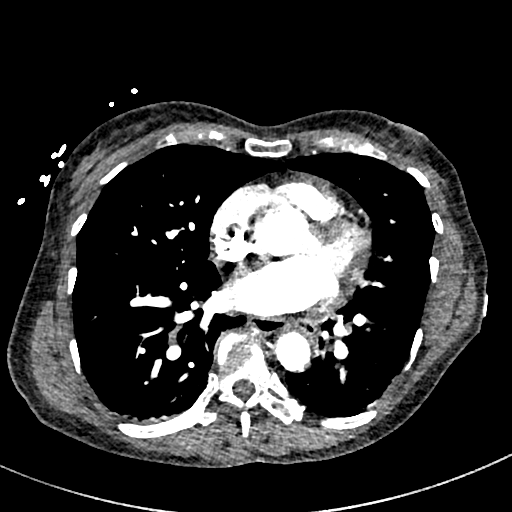
[im 151/302  lung]
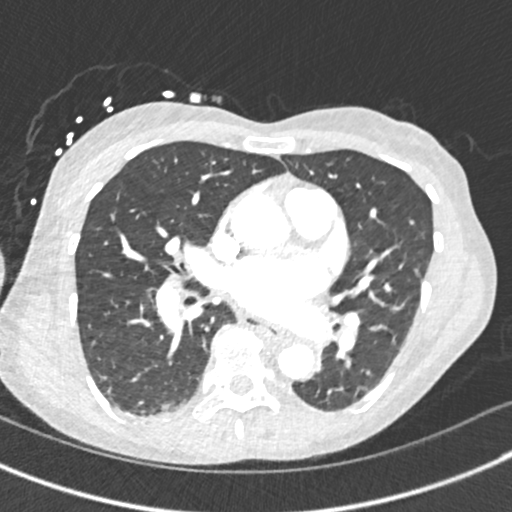
[im 168/302  mediastinal]
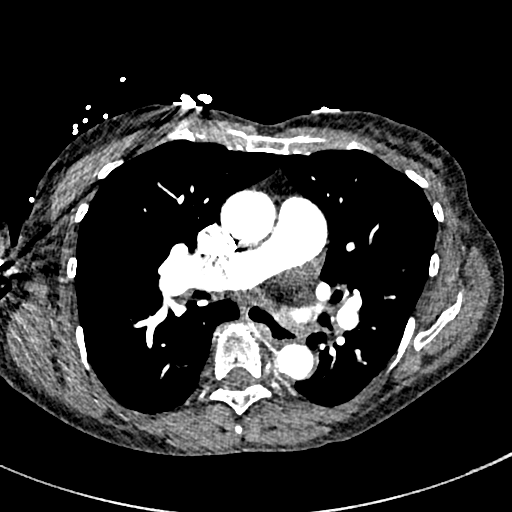
[im 184/302  lung]
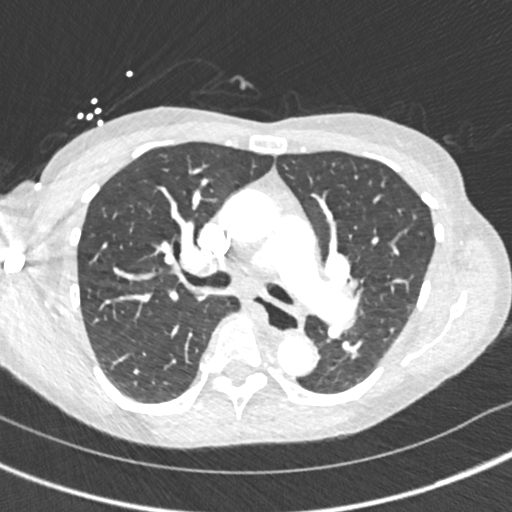
[im 201/302  mediastinal]
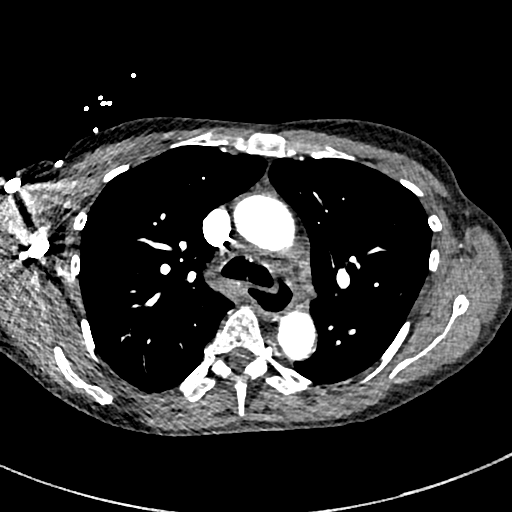
[im 218/302  lung]
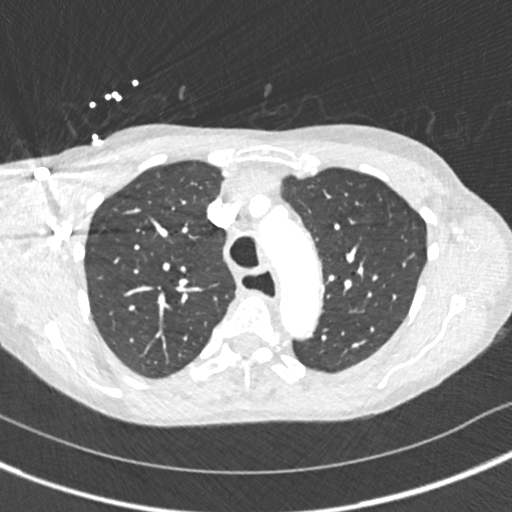
[im 235/302  mediastinal]
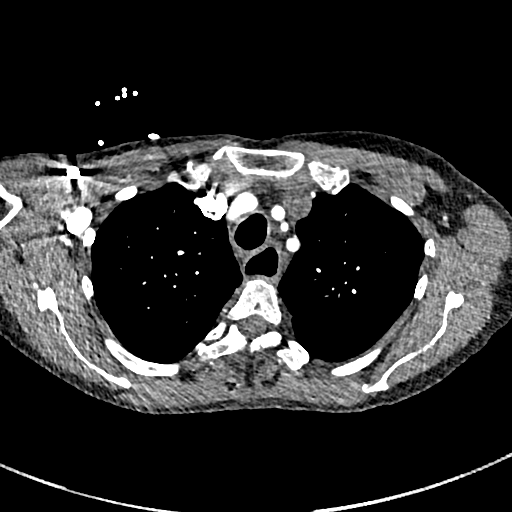
[im 251/302  lung]
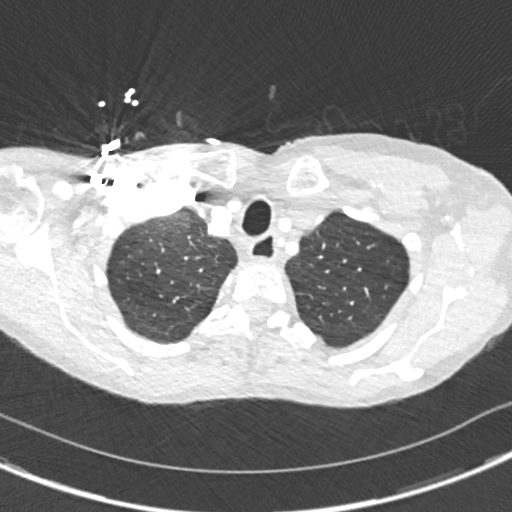
[im 268/302  mediastinal]
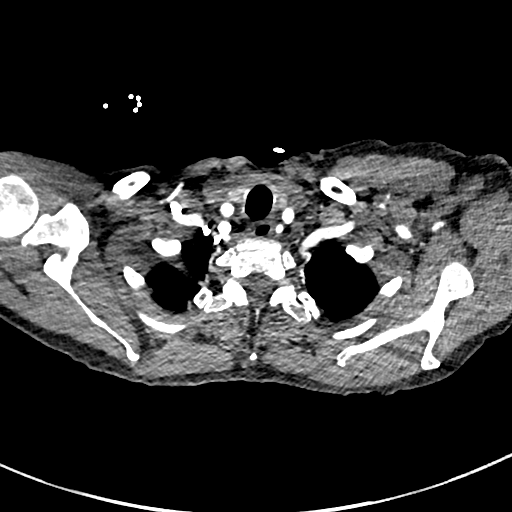
[im 285/302  lung]
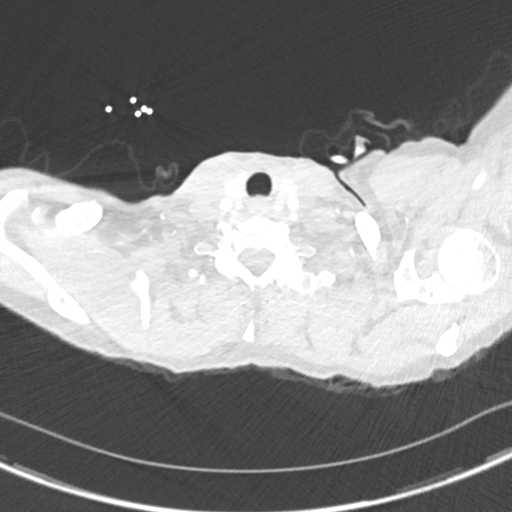

[Series 8: pe 2mm cor · coronal · 0.53mm/px · 1 of 100 slices shown]
[im 50/100  mediastinal]
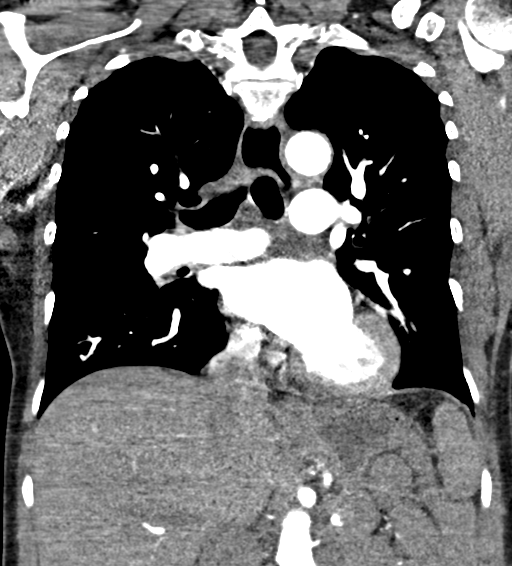

[18 of 36 positions shown; findings below may reference images not displayed]

FINDINGS: Cardiovascular: This is a technically satisfactory study. No
pulmonary emboli are identified. UPPER limits normal heart size
noted. Mild coronary artery and thoracic aortic atherosclerotic
calcifications are noted. There is no evidence of thoracic aortic
aneurysm or definite dissection. No pericardial effusion identified.

Mediastinum/Nodes: No enlarged mediastinal, hilar, or axillary lymph
nodes. Thyroid gland, trachea, and esophagus demonstrate no
significant findings.

Lungs/Pleura: LEFT basilar atelectasis noted. Mucous filling LEFT
LOWER lobe bronchi noted. There is no evidence of airspace disease,
consolidation, mass, nodule, pleural effusion or pneumothorax.

Upper Abdomen: No acute abnormality

Musculoskeletal: No acute or suspicious bony abnormalities.

Review of the MIP images confirms the above findings.
IMPRESSION: 1. No evidence of pulmonary emboli or thoracic aortic
aneurysm/definite dissection.
2. LEFT LOWER lobe mucous plugging with LEFT basilar atelectasis.
3. Coronary artery and Aortic Atherosclerosis ([id number]).

## 2018-07-15 DIAGNOSIS — J45909 Unspecified asthma, uncomplicated: Secondary | ICD-10-CM | POA: Diagnosis not present

## 2018-07-15 DIAGNOSIS — R062 Wheezing: Secondary | ICD-10-CM | POA: Diagnosis not present

## 2018-08-09 DIAGNOSIS — F6381 Intermittent explosive disorder: Secondary | ICD-10-CM | POA: Diagnosis not present

## 2018-08-15 DIAGNOSIS — R062 Wheezing: Secondary | ICD-10-CM | POA: Diagnosis not present

## 2018-08-15 DIAGNOSIS — J45909 Unspecified asthma, uncomplicated: Secondary | ICD-10-CM | POA: Diagnosis not present

## 2018-09-06 ENCOUNTER — Telehealth: Payer: Self-pay

## 2018-09-06 NOTE — Telephone Encounter (Signed)
Opened in error

## 2018-09-06 NOTE — Telephone Encounter (Signed)
Phone number disconnected

## 2018-09-10 DIAGNOSIS — F6381 Intermittent explosive disorder: Secondary | ICD-10-CM | POA: Diagnosis not present

## 2018-09-15 DIAGNOSIS — J45909 Unspecified asthma, uncomplicated: Secondary | ICD-10-CM | POA: Diagnosis not present

## 2018-09-15 DIAGNOSIS — R062 Wheezing: Secondary | ICD-10-CM | POA: Diagnosis not present

## 2018-10-09 DIAGNOSIS — F6381 Intermittent explosive disorder: Secondary | ICD-10-CM | POA: Diagnosis not present

## 2018-10-15 DIAGNOSIS — J45909 Unspecified asthma, uncomplicated: Secondary | ICD-10-CM | POA: Diagnosis not present

## 2018-10-15 DIAGNOSIS — R062 Wheezing: Secondary | ICD-10-CM | POA: Diagnosis not present

## 2018-11-15 DIAGNOSIS — R062 Wheezing: Secondary | ICD-10-CM | POA: Diagnosis not present

## 2018-11-15 DIAGNOSIS — J45909 Unspecified asthma, uncomplicated: Secondary | ICD-10-CM | POA: Diagnosis not present

## 2018-12-05 ENCOUNTER — Encounter (HOSPITAL_COMMUNITY): Payer: Self-pay

## 2018-12-05 ENCOUNTER — Inpatient Hospital Stay (HOSPITAL_COMMUNITY)
Admission: EM | Admit: 2018-12-05 | Discharge: 2018-12-07 | DRG: 193 | Disposition: A | Discharge status: Home / Self Care | Payer: Medicare HMO | Attending: Internal Medicine | Admitting: Internal Medicine

## 2018-12-05 ENCOUNTER — Other Ambulatory Visit: Payer: Self-pay

## 2018-12-05 ENCOUNTER — Inpatient Hospital Stay (HOSPITAL_COMMUNITY): Payer: Medicare HMO

## 2018-12-05 ENCOUNTER — Emergency Department (HOSPITAL_COMMUNITY): Payer: Medicare HMO

## 2018-12-05 DIAGNOSIS — Z8701 Personal history of pneumonia (recurrent): Secondary | ICD-10-CM | POA: Diagnosis not present

## 2018-12-05 DIAGNOSIS — J9621 Acute and chronic respiratory failure with hypoxia: Secondary | ICD-10-CM | POA: Diagnosis not present

## 2018-12-05 DIAGNOSIS — R0602 Shortness of breath: Secondary | ICD-10-CM | POA: Diagnosis not present

## 2018-12-05 DIAGNOSIS — J189 Pneumonia, unspecified organism: Secondary | ICD-10-CM | POA: Diagnosis present

## 2018-12-05 DIAGNOSIS — E785 Hyperlipidemia, unspecified: Secondary | ICD-10-CM | POA: Diagnosis present

## 2018-12-05 DIAGNOSIS — J9601 Acute respiratory failure with hypoxia: Secondary | ICD-10-CM | POA: Diagnosis not present

## 2018-12-05 DIAGNOSIS — E876 Hypokalemia: Secondary | ICD-10-CM | POA: Diagnosis not present

## 2018-12-05 DIAGNOSIS — J205 Acute bronchitis due to respiratory syncytial virus: Secondary | ICD-10-CM | POA: Diagnosis present

## 2018-12-05 DIAGNOSIS — Z6820 Body mass index (BMI) 20.0-20.9, adult: Secondary | ICD-10-CM | POA: Diagnosis not present

## 2018-12-05 DIAGNOSIS — F70 Mild intellectual disabilities: Secondary | ICD-10-CM | POA: Diagnosis present

## 2018-12-05 DIAGNOSIS — R079 Chest pain, unspecified: Secondary | ICD-10-CM | POA: Diagnosis present

## 2018-12-05 DIAGNOSIS — E440 Moderate protein-calorie malnutrition: Secondary | ICD-10-CM | POA: Diagnosis not present

## 2018-12-05 DIAGNOSIS — J45909 Unspecified asthma, uncomplicated: Secondary | ICD-10-CM | POA: Diagnosis present

## 2018-12-05 DIAGNOSIS — R0902 Hypoxemia: Secondary | ICD-10-CM | POA: Diagnosis present

## 2018-12-05 DIAGNOSIS — J181 Lobar pneumonia, unspecified organism: Principal | ICD-10-CM | POA: Diagnosis present

## 2018-12-05 DIAGNOSIS — D649 Anemia, unspecified: Secondary | ICD-10-CM | POA: Diagnosis present

## 2018-12-05 DIAGNOSIS — J4520 Mild intermittent asthma, uncomplicated: Secondary | ICD-10-CM | POA: Diagnosis not present

## 2018-12-05 DIAGNOSIS — R109 Unspecified abdominal pain: Secondary | ICD-10-CM | POA: Diagnosis not present

## 2018-12-05 DIAGNOSIS — K219 Gastro-esophageal reflux disease without esophagitis: Secondary | ICD-10-CM | POA: Diagnosis not present

## 2018-12-05 DIAGNOSIS — M545 Low back pain: Secondary | ICD-10-CM | POA: Diagnosis not present

## 2018-12-05 LAB — COMPREHENSIVE METABOLIC PANEL
ALK PHOS: 68 U/L (ref 38–126)
ALT: 14 U/L (ref 0–44)
AST: 20 U/L (ref 15–41)
Albumin: 4.3 g/dL (ref 3.5–5.0)
Anion gap: 9 (ref 5–15)
BUN: 17 mg/dL (ref 6–20)
CO2: 26 mmol/L (ref 22–32)
Calcium: 8.9 mg/dL (ref 8.9–10.3)
Chloride: 103 mmol/L (ref 98–111)
Creatinine, Ser: 0.64 mg/dL (ref 0.44–1.00)
GFR calc Af Amer: >60 mL/min (ref >60)
GFR calc non Af Amer: >60 mL/min (ref >60)
Glucose, Bld: 105 mg/dL — ABNORMAL HIGH (ref 70–99)
Potassium: 3.4 mmol/L — ABNORMAL LOW (ref 3.5–5.1)
Sodium: 138 mmol/L (ref 135–145)
Total Bilirubin: 1.1 mg/dL (ref 0.3–1.2)
Total Protein: 7.5 g/dL (ref 6.5–8.1)

## 2018-12-05 LAB — CBC WITH DIFFERENTIAL/PLATELET
ABS IMMATURE GRANULOCYTES: 0.03 10*3/uL (ref 0.00–0.07)
Basophils Absolute: 0 10*3/uL (ref 0.0–0.1)
Basophils Relative: 0 %
EOS ABS: 0 10*3/uL (ref 0.0–0.5)
EOS PCT: 0 %
HCT: 37.9 % (ref 36.0–46.0)
HEMOGLOBIN: 12.4 g/dL (ref 12.0–15.0)
IMMATURE GRANULOCYTES: 0 %
Lymphocytes Relative: 12 %
Lymphs Abs: 1 10*3/uL (ref 0.7–4.0)
MCH: 30.5 pg (ref 26.0–34.0)
MCHC: 32.7 g/dL (ref 30.0–36.0)
MCV: 93.3 fL (ref 80.0–100.0)
MONO ABS: 0.6 10*3/uL (ref 0.1–1.0)
Monocytes Relative: 7 %
NEUTROS ABS: 6.5 10*3/uL (ref 1.7–7.7)
NEUTROS PCT: 81 %
NRBC: 0 % (ref 0.0–0.2)
Platelets: 209 10*3/uL (ref 150–400)
RBC: 4.06 MIL/uL (ref 3.87–5.11)
RDW: 12.4 % (ref 11.5–15.5)
WBC: 8.1 10*3/uL (ref 4.0–10.5)

## 2018-12-05 LAB — TROPONIN I: Troponin I: <0.03 ng/mL (ref <0.03)

## 2018-12-05 LAB — D-DIMER, QUANTITATIVE: D-Dimer, Quant: 0.51 ug/mL-FEU — ABNORMAL HIGH (ref 0.00–0.50)

## 2018-12-05 LAB — RESPIRATORY PANEL BY PCR

## 2018-12-05 LAB — URINALYSIS, ROUTINE W REFLEX MICROSCOPIC
BILIRUBIN URINE: NEGATIVE
Glucose, UA: NEGATIVE mg/dL
KETONES UR: 80 mg/dL — AB
Nitrite: NEGATIVE
PROTEIN: 30 mg/dL — AB
Specific Gravity, Urine: 1.021 (ref 1.005–1.030)
pH: 5 (ref 5.0–8.0)

## 2018-12-05 LAB — MRSA PCR SCREENING: MRSA by PCR: NEGATIVE

## 2018-12-05 LAB — MAGNESIUM: Magnesium: 2.1 mg/dL (ref 1.7–2.4)

## 2018-12-05 MED ORDER — SODIUM CHLORIDE (PF) 0.9 % IJ SOLN
INTRAMUSCULAR | Status: AC
  Filled 2018-12-05: qty 50

## 2018-12-05 MED ORDER — ONDANSETRON HCL 4 MG PO TABS: 4.0000 mg | ORAL_TABLET | Freq: Four times a day (QID) | ORAL | Status: DC | PRN

## 2018-12-05 MED ORDER — ACETAMINOPHEN 325 MG PO TABS: 650.0000 mg | ORAL_TABLET | Freq: Four times a day (QID) | ORAL | Status: DC | PRN

## 2018-12-05 MED ORDER — ONDANSETRON HCL 4 MG/2ML IJ SOLN: 4.0000 mg | Freq: Four times a day (QID) | INTRAMUSCULAR | Status: DC | PRN

## 2018-12-05 MED ORDER — GUAIFENESIN ER 600 MG PO TB12
600.0000 mg | ORAL_TABLET | Freq: Two times a day (BID) | ORAL | Status: DC
  Administered 2018-12-05 – 2018-12-07 (×4): 600 mg via ORAL
  Filled 2018-12-05 (×4): qty 1

## 2018-12-05 MED ORDER — ENOXAPARIN SODIUM 30 MG/0.3ML ~~LOC~~ SOLN
30.0000 mg | SUBCUTANEOUS | Status: DC
  Administered 2018-12-05 – 2018-12-06 (×2): 30 mg via SUBCUTANEOUS
  Filled 2018-12-05 (×2): qty 0.3

## 2018-12-05 MED ORDER — ACETAMINOPHEN 650 MG RE SUPP: 650.0000 mg | Freq: Four times a day (QID) | RECTAL | Status: DC | PRN

## 2018-12-05 MED ORDER — FOLIC ACID 1 MG PO TABS
1.0000 mg | ORAL_TABLET | Freq: Every day | ORAL | Status: DC
  Administered 2018-12-05 – 2018-12-07 (×3): 1 mg via ORAL
  Filled 2018-12-05 (×3): qty 1

## 2018-12-05 MED ORDER — IPRATROPIUM-ALBUTEROL 0.5-2.5 (3) MG/3ML IN SOLN
3.0000 mL | Freq: Four times a day (QID) | RESPIRATORY_TRACT | Status: DC
  Administered 2018-12-05 – 2018-12-07 (×8): 3 mL via RESPIRATORY_TRACT
  Filled 2018-12-05 (×8): qty 3

## 2018-12-05 MED ORDER — SODIUM CHLORIDE 0.9 % IV SOLN
INTRAVENOUS | Status: DC
  Administered 2018-12-05: 22:00:00 via INTRAVENOUS

## 2018-12-05 MED ORDER — SODIUM CHLORIDE 0.9 % IV SOLN
1.0000 g | INTRAVENOUS | Status: DC
  Administered 2018-12-05 – 2018-12-06 (×2): 1 g via INTRAVENOUS
  Filled 2018-12-05 (×2): qty 1

## 2018-12-05 MED ORDER — SIMVASTATIN 20 MG PO TABS
20.0000 mg | ORAL_TABLET | Freq: Every day | ORAL | Status: DC
  Administered 2018-12-05 – 2018-12-06 (×2): 20 mg via ORAL
  Filled 2018-12-05 (×2): qty 1

## 2018-12-05 MED ORDER — IOPAMIDOL (ISOVUE-370) INJECTION 76%
INTRAVENOUS | Status: AC
  Filled 2018-12-05: qty 100

## 2018-12-05 MED ORDER — SODIUM CHLORIDE 0.9 % IV SOLN
500.0000 mg | INTRAVENOUS | Status: DC
  Administered 2018-12-05: 500 mg via INTRAVENOUS
  Filled 2018-12-05: qty 500

## 2018-12-05 MED ORDER — MOMETASONE FURO-FORMOTEROL FUM 200-5 MCG/ACT IN AERO
2.0000 | INHALATION_SPRAY | Freq: Two times a day (BID) | RESPIRATORY_TRACT | Status: DC
  Administered 2018-12-05 – 2018-12-07 (×4): 2 via RESPIRATORY_TRACT
  Filled 2018-12-05: qty 8.8

## 2018-12-05 MED ORDER — HYDROCODONE-ACETAMINOPHEN 5-325 MG PO TABS: 1.0000 | ORAL_TABLET | ORAL | Status: DC | PRN

## 2018-12-05 MED ORDER — POTASSIUM CHLORIDE CRYS ER 20 MEQ PO TBCR
20.0000 meq | EXTENDED_RELEASE_TABLET | Freq: Once | ORAL | Status: AC
  Administered 2018-12-05: 20 meq via ORAL
  Filled 2018-12-05: qty 1

## 2018-12-05 MED ORDER — VITAMIN B-1 100 MG PO TABS
100.0000 mg | ORAL_TABLET | Freq: Every day | ORAL | Status: DC
  Administered 2018-12-05 – 2018-12-07 (×3): 100 mg via ORAL
  Filled 2018-12-05 (×3): qty 1

## 2018-12-05 MED ORDER — IOPAMIDOL (ISOVUE-370) INJECTION 76%
100.0000 mL | Freq: Once | INTRAVENOUS | Status: AC | PRN
  Administered 2018-12-05: 100 mL via INTRAVENOUS

## 2018-12-05 MED ORDER — ALBUTEROL SULFATE (2.5 MG/3ML) 0.083% IN NEBU: 2.5000 mg | INHALATION_SOLUTION | RESPIRATORY_TRACT | Status: DC | PRN

## 2018-12-05 NOTE — H&P (Signed)
[name] [name]:[id number] DOB: 1955/12/27 DOA: 12/05/2018     PCP: [name], MD   Outpatient Specialists:     GI  Dr.  Shearon Stalls) Christella Hartigan   Patient arrived to ER on 12/05/18 at 1442  Patient coming from: home Lives   With family    Chief Complaint:  Chief Complaint  Patient presents with  . Shortness of Breath  . Cough    HPI: [name] is a 62 y.o. female with medical history significant of asthma, dyslipidemia,andcognitive impairment.     Presented with shortness of breath for the past few days getting progressively worse productive cough she has been trying to use oxygen on and off for the past few weeks she had to borrow it from her sister who she is living with. Reports weight loss decreased appetite.  No smoking hx   Denies any fever or chills, she has been coughing some white stuff and yellow stuff for the past 1 week  She reports some chest pain it has been coming and going. It gets better when she pats her  Chest. She reports  Back pain when she takes a deep  Breath.    Presented to emergency department oxygen saturation was noted to be 87% on room air started 3 L and improved to 92% Scribes chest pressure will going on for the past few days Reporting occasional abdominal pain and back pain.  Admitted in May for acute hypoxic respiratory failure due to asthma exacerbation Regarding pertinent Chronic problems: Hx of asthma had to be hospitalized in the past due to hypoxia   While in ER:  The following Work up has been ordered so far:  Orders Placed This Encounter  Procedures  . DG Chest 2 View  . DG Lumbar Spine 2-3 Views  . CBC with Differential  . Comprehensive metabolic panel  . Urinalysis, Routine w reflex microscopic  . Consult to hospitalist  [phone]  . EKG 12-Lead     Following Medications were ordered in ER: Medications - No data to display  Significant initial  Findings: Abnormal Labs Reviewed  COMPREHENSIVE METABOLIC  PANEL - Abnormal; Notable for the following components:      Result Value   Potassium 3.4 (*)    Glucose, Bld 105 (*)    All other components within normal limits  URINALYSIS, ROUTINE W REFLEX MICROSCOPIC - Abnormal; Notable for the following components:   Hgb urine dipstick SMALL (*)    Ketones, ur 80 (*)    Protein, ur 30 (*)    Leukocytes, UA TRACE (*)    Bacteria, UA RARE (*)    All other components within normal limits     Lactic Acid, Venous    Component Value Date/Time   LATICACIDVEN 1.5 05/12/2018 0519    Na 138 K 3.4  Cr  stable,   Lab Results  Component Value Date   CREATININE 0.64 12/05/2018   CREATININE 0.63 05/14/2018   CREATININE 0.79 05/13/2018      WBC 8.1  HG/HCT stable,      Component Value Date/Time   HGB 12.4 12/05/2018 1644   HCT 37.9 12/05/2018 1644       Troponin (Point of Care Test) No results for input(s): TROPIPOC in the last 72 hours.    BNP (last 3 results) No results for input(s): BNP in the last 8760 hours.  ProBNP (last 3 results) No results for input(s): PROBNP in the last 8760 hours.  UA  no evidence of UTI     CXR - LLL PNA    ECG:  Personally reviewed by me showing: HR : 109 Rhythm:   Sinus tachycardia   no evidence of ischemic changes QTC   414     ED Triage Vitals  Enc Vitals Group     BP 12/05/18 1458 128/83     Pulse Rate 12/05/18 1458 (!) 108     Resp 12/05/18 1458 15     Temp 12/05/18 1458 99.5 F (37.5 C)     Temp Source 12/05/18 1458 Oral     SpO2 12/05/18 1458 92 %     Weight 12/05/18 1458 104 lb (47.2 kg)     Height 12/05/18 1458 5' (1.524 m)     Head Circumference --      Peak Flow --      Pain Score 12/05/18 1502 0     Pain Loc --      Pain Edu? --      Excl. in GC? --   TMAX(24)@       Latest  Blood pressure 107/80, pulse (!) 102, temperature 99.5 F (37.5 C), temperature source Oral, resp. rate (!) 27, height 5' (1.524 m), weight 47.2 kg, SpO2 97 %.    Hospitalist was called  for admission for LL PNA Acute on chronic respiratory failure with hypoxia   Review of Systems:    Pertinent positives include: chest pain  Constitutional:  No weight loss, night sweats, Fevers, chills, fatigue, weight loss  HEENT:  No headaches, Difficulty swallowing,Tooth/dental problems,Sore throat,  No sneezing, itching, ear ache, nasal congestion, post nasal drip,  Cardio-vascular:  No , Orthopnea, PND, anasarca, dizziness, palpitations.no Bilateral lower extremity swelling  GI:  No heartburn, indigestion, abdominal pain, nausea, vomiting, diarrhea, change in bowel habits, loss of appetite, melena, blood in stool, hematemesis Resp:  no shortness of breath at rest. No dyspnea on exertion, No excess mucus, no productive cough, No non-productive cough, No coughing up of blood.No change in color of mucus.No wheezing. Skin:  no rash or lesions. No jaundice GU:  no dysuria, change in color of urine, no urgency or frequency. No straining to urinate.  No flank pain.  Musculoskeletal:  No joint pain or no joint swelling. No decreased range of motion. No back pain.  Psych:  No change in mood or affect. No depression or anxiety. No memory loss.  Neuro: no localizing neurological complaints, no tingling, no weakness, no double vision, no gait abnormality, no slurred speech, no confusion  All systems reviewed and apart from HOPI all are negative  Past Medical History:   Past Medical History:  Diagnosis Date  . Acute bronchitis   . Anemia, unspecified   . Asthma   . Esophageal reflux   . Mild intellectual disabilities   . Other acute reactions to stress   . Pneumonia    [facility]  . Pneumonia, organism unspecified(486)   . Unspecified asthma(493.90)       Past Surgical History:  Procedure Laterality Date  . TONSILLECTOMY      Social History:  Ambulatory  independently      reports that she has never smoked. She has never used  smokeless tobacco. She reports that she does not drink alcohol or use drugs.     Family History:   Family History  Problem Relation Age of Onset  . Colon cancer Father   . Asthma Sister   . Alzheimer's  disease Mother     Allergies: No Known Allergies   Prior to Admission medications   Medication Sig Start Date End Date Taking? Authorizing Provider  ibuprofen (ADVIL,MOTRIN) 200 MG tablet Take 400 mg by mouth daily as needed for headache.   Yes [provider]  albuterol (PROVENTIL HFA;VENTOLIN HFA) 108 (90 Base) MCG/ACT inhaler Inhale 2 puffs into the lungs every 4 (four) hours as needed for wheezing or shortness of breath. Patient not taking: Reported on 12/05/2018 03/20/16   Gerhard Munch, MD  budesonide-formoterol [facility]) 160-4.5 MCG/ACT inhaler Inhale 2 puffs into the lungs 2 (two) times daily. Patient not taking: Reported on 12/05/2018 10/14/15   [name], MD  ipratropium-albuterol (DUONEB) 0.5-2.5 (3) MG/3ML SOLN Take 3 mLs by nebulization every 6 (six) hours as needed (as needed for shortness of breath.). Patient not taking: Reported on 06/08/2018 05/15/18 06/14/18  Arrien, York Ram, MD  simvastatin (ZOCOR) 20 MG tablet Take 1 tablet (20 mg total) by mouth at bedtime. Patient not taking: Reported on 06/22/2018 10/22/15   [name], MD   Physical Exam: Blood pressure 107/80, pulse (!) 102, temperature 99.5 F (37.5 C), temperature source Oral, resp. rate (!) 27, height 5' (1.524 m), weight 47.2 kg, SpO2 97 %. 1. General:  in No Acute distress  Chronically ill  thin-appearing 2. Psychological: Alert and  Oriented 3. Head/ENT:    Dry Mucous Membranes                          Head Non traumatic, neck supple                          Poor Dentition 4. SKIN:  decreased Skin turgor,  Skin clean Dry and intact no rash 5. Heart: Regular rate and rhythm no Murmur, no Rub or gallop 6. Lungs:  no wheezes some crackles  Distant  7. Abdomen:  Soft,  non-tender, Non distended bowel sounds present 8. Lower extremities: no clubbing, cyanosis, or  edema 9. Neurologically Grossly intact, moving all 4 extremities equally   10. MSK: Normal range of motion   LABS:     Recent Labs  Lab 12/05/18 1644  WBC 8.1  NEUTROABS 6.5  HGB 12.4  HCT 37.9  MCV 93.3  PLT 209   Basic Metabolic Panel: Recent Labs  Lab 12/05/18 1644  NA 138  K 3.4*  CL 103  CO2 26  GLUCOSE 105*  BUN 17  CREATININE 0.64  CALCIUM 8.9      Recent Labs  Lab 12/05/18 1644  AST 20  ALT 14  ALKPHOS 68  BILITOT 1.1  PROT 7.5  ALBUMIN 4.3   No results for input(s): LIPASE, AMYLASE in the last 168 hours. No results for input(s): AMMONIA in the last 168 hours.    HbA1C: No results for input(s): HGBA1C in the last 72 hours. CBG: No results for input(s): GLUCAP in the last 168 hours.    Urine analysis:    Component Value Date/Time   COLORURINE YELLOW 12/05/2018 1703   APPEARANCEUR CLEAR 12/05/2018 1703   LABSPEC 1.021 12/05/2018 1703   PHURINE 5.0 12/05/2018 1703   GLUCOSEU NEGATIVE 12/05/2018 1703   HGBUR SMALL (A) 12/05/2018 1703   BILIRUBINUR NEGATIVE 12/05/2018 1703   KETONESUR 80 (A) 12/05/2018 1703   PROTEINUR 30 (A) 12/05/2018 1703   UROBILINOGEN 0.2 12/07/2014 0513   NITRITE NEGATIVE 12/05/2018 1703   LEUKOCYTESUR TRACE (A)  12/05/2018 1703       Cultures:    Component Value Date/Time   SDES URINE, CLEAN CATCH 05/12/2018 0017   SPECREQUEST NONE 05/12/2018 0017   CULT (A) 05/12/2018 0017    <10,000 COLONIES/mL INSIGNIFICANT GROWTH Performed at [facility], [address], [place], [place] [address]    REPTSTATUS 05/13/2018 FINAL 05/12/2018 0017     Radiological Exams on Admission: Dg Chest 2 View  Result Date: 12/05/2018 CLINICAL DATA:  Shortness of breath EXAM: CHEST - 2 VIEW COMPARISON:  05/11/2018 radiograph and CT FINDINGS: Left lower lobe pneumonia. No significant effusion. Normal heart size. No  pneumothorax. IMPRESSION: Left lower lobe pneumonia. Radiographic follow-up to resolution is advised. Electronically Signed   By: Jasmine Pang M.D.   On: 12/05/2018 17:09   Dg Lumbar Spine 2-3 Views  Result Date: 12/05/2018 CLINICAL DATA:  Low back pain EXAM: LUMBAR SPINE - 2-3 VIEW COMPARISON:  12/30/2004 FINDINGS: Mild levoscoliosis of the lumbar spine. Vertebral body heights are normal. Mild degenerative change L3-L4 with disc space narrowing and osteophyte. Mild spurring anteriorly at most levels. IMPRESSION: Mild scoliosis and degenerative change without acute osseous abnormality. Electronically Signed   By: Jasmine Pang M.D.   On: 12/05/2018 17:17    Chart has been reviewed    Assessment/Plan   62 y.o. female with medical history significant of asthma, dyslipidemia,andcognitive impairment.   Admitted for LLL PNA and  Acute on chronic respiratory failure with hypoxia  Present on Admission: . Acute respiratory failure with hypoxia (HCC) -likely secondary to pneumonia CTA negative for PE no evidence of malignancy.  Continue oxygen will need to ambulate with pulse ox prior to discharge to see if patient may need oxygen at home  . CAP (community acquired pneumonia) -  - will admit for treatment of CAP will start on appropriate antibiotic coverage.   Obtain:  sputum cultures,                  obtain respiratory panel                    blood cultures if febrile or if decompensates.                   strep pneumo UA antigen,                      Provide oxygen as needed.   . Asthma, chronic -  likely contributing to acute respiratory failure with hypoxia.  Currently no wheezing.  Continue home medications as needed  . 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 HLD (hyperlipidemia) -stable continue home medications  . Hypoxia -provide oxygen as needed . Hypokalemia - - will replace and repeat in AM,  check magnesium level and replace as needed  . Chest pain  - in the setting of pneumonia cardiac markers negative observe on telemetry cycle cardiac enzymes doubt ACS   Other plan as per orders.  DVT prophylaxis:     Lovenox     Code Status:  FULL CODE     as per patient   I had personally discussed CODE STATUS with patient    Family Communication:   Family not at  Bedside    Disposition Plan:    To home once workup is complete and patient is stable  Nutrition    consulted                              Consults called: none  Admission status:   inpatient     Expect 2 midnight stay secondary to severity of patient's current illness including   hemodynamic instability despite optimal treatment ( hypoxia)   Severe lab/radiological abnormalities including:  LLL PNA      I expect  patient to be hospitalized for 2 midnights requiring inpatient medical care.  Patient is at high risk for adverse outcome (such as loss of life or disability) if not treated.  Indication for inpatient stay as follows:  Hemodynamic instability despite maximal medical therapy,  Ongoing hypoxia not on oxygen at baseline      persistent chest pain despite medical management   Need for IV antibiotics, IV fluids,         Level of care    tele  For 24H          Vineet Kinney 12/05/2018, 10:23 PM    [facility]  Pager [phone]   after 2 AM please page floor coverage PA If 7AM-7PM, please contact the day team taking care of the patient  [email or web address]  Password [email or web address]

## 2018-12-05 NOTE — ED Notes (Signed)
Patient transported to CT 

## 2018-12-05 NOTE — ED Provider Notes (Signed)
[facility]-EMERGENCY DEPT Provider Note   CSN: [id number] Arrival date & time: 12/05/18  1442     History   Chief Complaint Chief Complaint  Patient presents with  . Shortness of Breath  . Cough    HPI [name] [name] is a 62 y.o. female.  62 y.o female with a PMH Asthma, GERD presents to the ED with a chief complaint of shortness of breath and chest pain since yesterday. Patient reports she been feeling more shortness of breath than usual. She also reports some chest pressure which has been going on for some days. Patient was stating at 70-80 on arrival and reports she's been using her daughter oxygen at home for improvement.  Patient has not tried any therapy at home for relief aside from the oxygen.  She a very poor historian.  She also reports some abdominal pain on and off for the past couple weeks along with some lower midline back pain.  Denies any fever, emesis, nausea or urinary symptoms.     Past Medical History:  Diagnosis Date  . Acute bronchitis   . Anemia, unspecified   . Asthma   . Esophageal reflux   . Mild intellectual disabilities   . Other acute reactions to stress   . Pneumonia    went to [facility] from 06/01/18-06/05/18  . Pneumonia, organism unspecified(486)   . Unspecified asthma(493.90)     Patient Active Problem List   Diagnosis Date Noted  . CAP (community acquired pneumonia) 12/05/2018  . Hypoxia 12/05/2018  . Hypokalemia 12/05/2018  . COPD exacerbation (HCC)   . Acute respiratory failure with hypoxia (HCC) 05/11/2018  . Asthma exacerbation 05/11/2018  . Sepsis (HCC) 05/11/2018  . HLD (hyperlipidemia) 05/11/2018  . Mild intellectual disabilities   . Routine general medical examination at a health care facility 11/10/2016  . Iron deficiency anemia 12/16/2014  . Asthma, chronic 04/10/2013    Past Surgical History:  Procedure Laterality Date  . TONSILLECTOMY       OB History   None      Home  Medications    Prior to Admission medications   Medication Sig Start Date End Date Taking? Authorizing Provider  ibuprofen (ADVIL,MOTRIN) 200 MG tablet Take 400 mg by mouth daily as needed for headache.   Yes [provider]  albuterol (PROVENTIL HFA;VENTOLIN HFA) 108 (90 Base) MCG/ACT inhaler Inhale 2 puffs into the lungs every 4 (four) hours as needed for wheezing or shortness of breath. Patient not taking: Reported on 12/05/2018 03/20/16   Gerhard MunchLockwood, Robert, MD  budesonide-formoterol [facility](SYMBICORT) 160-4.5 MCG/ACT inhaler Inhale 2 puffs into the lungs 2 (two) times daily. Patient not taking: Reported on 12/05/2018 10/14/15   Myrlene Brokerrawford, Elizabeth A, MD  ipratropium-albuterol (DUONEB) 0.5-2.5 (3) MG/3ML SOLN Take 3 mLs by nebulization every 6 (six) hours as needed (as needed for shortness of breath.). Patient not taking: Reported on 06/08/2018 05/15/18 06/14/18  Arrien, York RamMauricio Daniel, MD  simvastatin (ZOCOR) 20 MG tablet Take 1 tablet (20 mg total) by mouth at bedtime. Patient not taking: Reported on 06/22/2018 10/22/15   Myrlene Brokerrawford, Elizabeth A, MD    Family History Family History  Problem Relation Age of Onset  . Colon cancer Father   . Asthma Sister   . Alzheimer's disease Mother     Social History Social History   Tobacco Use  . Smoking status: Never Smoker  . Smokeless tobacco: Never Used  Substance Use Topics  . Alcohol use: No  . Drug use:  No     Allergies   Patient has no known allergies.   Review of Systems Review of Systems  Constitutional: Negative for fever.  HENT: Negative for sore throat.   Respiratory: Positive for shortness of breath.   Cardiovascular: Positive for chest pain.  Gastrointestinal: Positive for abdominal pain. Negative for nausea and vomiting.  Genitourinary: Negative for dysuria and flank pain.  Musculoskeletal: Positive for back pain (lower lumbar).  Neurological: Negative for headaches.     Physical Exam Updated Vital Signs BP  113/75   Pulse 93   Temp 99.5 F (37.5 C) (Oral)   Resp (!) 26   Ht 5' (1.524 m)   Wt 47.2 kg   SpO2 96%   BMI 20.31 kg/m   Physical Exam  Constitutional: She is oriented to person, place, and time. She appears well-developed and well-nourished. She appears ill.  HENT:  Head: Normocephalic and atraumatic.  Mouth/Throat: Oropharynx is clear and moist.  Eyes: Pupils are equal, round, and reactive to light.  Neck: Normal range of motion. Neck supple.  Cardiovascular: Normal rate.  Pulmonary/Chest: She has decreased breath sounds. She has no wheezes. She has no rales.  Abdominal: Soft. Bowel sounds are normal. There is no tenderness.  Musculoskeletal:       Right lower leg: She exhibits no edema.       Left lower leg: She exhibits no edema.  Neurological: She is alert and oriented to person, place, and time.  Skin: Skin is warm and dry.  Nursing note and vitals reviewed.    ED Treatments / Results  Labs (all labs ordered are listed, but only abnormal results are displayed) Labs Reviewed  COMPREHENSIVE METABOLIC PANEL - Abnormal; Notable for the following components:      Result Value   Potassium 3.4 (*)    Glucose, Bld 105 (*)    All other components within normal limits  URINALYSIS, ROUTINE W REFLEX MICROSCOPIC - Abnormal; Notable for the following components:   Hgb urine dipstick SMALL (*)    Ketones, ur 80 (*)    Protein, ur 30 (*)    Leukocytes, UA TRACE (*)    Bacteria, UA RARE (*)    All other components within normal limits  RESPIRATORY PANEL BY PCR  MRSA PCR SCREENING  CBC WITH DIFFERENTIAL/PLATELET  TROPONIN I  TROPONIN I  TROPONIN I  D-DIMER, QUANTITATIVE (NOT AT [facility])  MAGNESIUM    EKG EKG Interpretation  Date/Time:  Wednesday December 05 2018 14:55:19 EST Ventricular Rate:  109 PR Interval:    QRS Duration: 93 QT Interval:  307 QTC Calculation: 414 R Axis:   60 Text Interpretation:  Sinus tachycardia Probable left atrial enlargement Borderline  repolarization abnormality Confirmed by Geoffery Lyons ([id number]) on 12/05/2018 3:31:36 PM   Radiology Dg Chest 2 View  Result Date: 12/05/2018 CLINICAL DATA:  Shortness of breath EXAM: CHEST - 2 VIEW COMPARISON:  05/11/2018 radiograph and CT FINDINGS: Left lower lobe pneumonia. No significant effusion. Normal heart size. No pneumothorax. IMPRESSION: Left lower lobe pneumonia. Radiographic follow-up to resolution is advised. Electronically Signed   By: Jasmine Pang M.D.   On: 12/05/2018 17:09   Dg Lumbar Spine 2-3 Views  Result Date: 12/05/2018 CLINICAL DATA:  Low back pain EXAM: LUMBAR SPINE - 2-3 VIEW COMPARISON:  12/30/2004 FINDINGS: Mild levoscoliosis of the lumbar spine. Vertebral body heights are normal. Mild degenerative change L3-L4 with disc space narrowing and osteophyte. Mild spurring anteriorly at most levels. IMPRESSION: Mild scoliosis and degenerative change  without acute osseous abnormality. Electronically Signed   By: Jasmine Pang M.D.   On: 12/05/2018 17:17    Procedures Procedures (including critical care time)  Medications Ordered in ED Medications - No data to display   Initial Impression / Assessment and Plan / ED Course  I have reviewed the triage vital signs and the nursing notes.  Pertinent labs & imaging results that were available during my care of the patient were reviewed by me and considered in my medical decision making (see chart for details).    Patient presents with shortness of breath x 2 days. She was brought in by daughter who report patient has been using her daughter's oxygen  machine. Patient reports overall weakness along with chest pain. On examination there is diminished lung sounds throughout.  CBC showed no leukocytosis, CMP showed slight decrease in potassium but the rest is within normal limits. UA showed small amount of leukocytes along with rare bacteria. She denies any urinary symptoms at this time.   DG Chest showed: Left lower lobe  pneumonia. Radiographic follow-up to resolution is  advised.    When patient was ambulated to the bathroom she destat to the 80%, will call hospitalist for admission as patient has a new oxygen requirement.   Final Clinical Impressions(s) / ED Diagnoses   Final diagnoses:  Shortness of breath  Community acquired pneumonia of left lower lobe of lung [facility])    ED Discharge Orders    None       Claude Manges, PA-C 12/05/18 1912    Mancel Bale, MD 12/05/18 2111

## 2018-12-05 NOTE — Progress Notes (Signed)
Microbiology Called with Critical Value Pt is RSV Positive Writer notified provider Will continue to monitor

## 2018-12-05 NOTE — ED Notes (Signed)
Provider at bedside

## 2018-12-05 NOTE — ED Triage Notes (Signed)
Pt arrives from home. Pt reports shortness of breath for a few days. Pt reports worsening SOB in the last few days. Pt reports productive cough. Pt reports having to use someone else's oxygen on and off for a few weeks. Upon arrival pts O2 sat was 87% on RA. Pt placed on 3 L o2. Pts O2 sat 92% on 3L.

## 2018-12-05 NOTE — ED Notes (Signed)
ED TO INPATIENT HANDOFF REPORT  Name/Age/Gender [name] [name] 62 y.o. female  Code Status Code Status History    Date Active Date Inactive Code Status Order ID Comments User Context   05/11/2018 1925 05/15/2018 1743 Full Code [id number]  Lorretta Harp, MD ED   12/06/2014 2036 12/09/2014 1810 Full Code [id number]  Hillary Bow, DO ED      Home/SNF/Other Home  Chief Complaint short of breath   Level of Care/Admitting Diagnosis ED Disposition    ED Disposition Condition Comment   Admit  Hospital Area: [facility] [[id number]]  Level of Care: Telemetry [5]  Admit to tele based on following criteria: Monitor for Ischemic changes  Diagnosis: CAP (community acquired pneumonia) [562130]  Admitting Physician: Therisa Doyne [[id number]]  Attending Physician: Therisa Doyne [[id number]]  Estimated length of stay: 3 - 4 days  Certification:: I certify this patient will need inpatient services for at least 2 midnights  PT Class (Do Not Modify): Inpatient [101]  PT Acc Code (Do Not Modify): Private [1]       Medical History Past Medical History:  Diagnosis Date  . Acute bronchitis   . Anemia, unspecified   . Asthma   . Esophageal reflux   . Mild intellectual disabilities   . Other acute reactions to stress   . Pneumonia    went to [facility] from 06/01/18-06/05/18  . Pneumonia, organism unspecified(486)   . Unspecified asthma(493.90)     Allergies No Known Allergies  IV Location/Drains/Wounds Patient Lines/Drains/Airways Status   Active Line/Drains/Airways    Name:   Placement date:   Placement time:   Site:   Days:   Peripheral IV 12/05/18 Left Forearm   12/05/18    1840    Forearm   less than 1          Labs/Imaging Results for orders placed or performed during the hospital encounter of 12/05/18 (from the past 48 hour(s))  CBC with Differential     Status: None   Collection Time: 12/05/18  4:44 PM  Result Value Ref Range   WBC 8.1 4.0 - 10.5  K/uL   RBC 4.06 3.87 - 5.11 MIL/uL   Hemoglobin 12.4 12.0 - 15.0 g/dL   HCT 86.5 78.4 - 69.6 %   MCV 93.3 80.0 - 100.0 fL   MCH 30.5 26.0 - 34.0 pg   MCHC 32.7 30.0 - 36.0 g/dL   RDW 29.5 28.4 - 13.2 %   Platelets 209 150 - 400 K/uL   nRBC 0.0 0.0 - 0.2 %   Neutrophils Relative % 81 %   Neutro Abs 6.5 1.7 - 7.7 K/uL   Lymphocytes Relative 12 %   Lymphs Abs 1.0 0.7 - 4.0 K/uL   Monocytes Relative 7 %   Monocytes Absolute 0.6 0.1 - 1.0 K/uL   Eosinophils Relative 0 %   Eosinophils Absolute 0.0 0.0 - 0.5 K/uL   Basophils Relative 0 %   Basophils Absolute 0.0 0.0 - 0.1 K/uL   Immature Granulocytes 0 %   Abs Immature Granulocytes 0.03 0.00 - 0.07 K/uL    Comment: Performed at [facility], [address], [place], [place] [address]  Comprehensive metabolic panel     Status: Abnormal   Collection Time: 12/05/18  4:44 PM  Result Value Ref Range   Sodium 138 135 - 145 mmol/L   Potassium 3.4 (L) 3.5 - 5.1 mmol/L   Chloride 103 98 - 111 mmol/L   CO2 26 22 -  32 mmol/L   Glucose, Bld 105 (H) 70 - 99 mg/dL   BUN 17 6 - 20 mg/dL   Creatinine, Ser 1.610.64 0.44 - 1.00 mg/dL   Calcium 8.9 8.9 - 09.610.3 mg/dL   Total Protein 7.5 6.5 - 8.1 g/dL   Albumin 4.3 3.5 - 5.0 g/dL   AST 20 15 - 41 U/L   ALT 14 0 - 44 U/L   Alkaline Phosphatase 68 38 - 126 U/L   Total Bilirubin 1.1 0.3 - 1.2 mg/dL   GFR calc non Af Amer >60 >60 mL/min   GFR calc Af Amer >60 >60 mL/min   Anion gap 9 5 - 15    Comment: Performed at [facility], [address] [place], [place] [address]  Urinalysis, Routine w reflex microscopic     Status: Abnormal   Collection Time: 12/05/18  5:03 PM  Result Value Ref Range   Color, Urine YELLOW YELLOW   APPearance CLEAR CLEAR   Specific Gravity, Urine 1.021 1.005 - 1.030   pH 5.0 5.0 - 8.0   Glucose, UA NEGATIVE NEGATIVE mg/dL   Hgb urine dipstick SMALL (A) NEGATIVE   Bilirubin Urine NEGATIVE NEGATIVE   Ketones, ur 80 (A) NEGATIVE mg/dL    Protein, ur 30 (A) NEGATIVE mg/dL   Nitrite NEGATIVE NEGATIVE   Leukocytes, UA TRACE (A) NEGATIVE   RBC / HPF 0-5 0 - 5 RBC/hpf   WBC, UA 0-5 0 - 5 WBC/hpf   Bacteria, UA RARE (A) NONE SEEN   Squamous Epithelial / LPF 0-5 0 - 5   Mucus PRESENT    Hyaline Casts, UA PRESENT     Comment: Performed at [facility], [address] [place], [place] [address]   Dg Chest 2 View  Result Date: 12/05/2018 CLINICAL DATA:  Shortness of breath EXAM: CHEST - 2 VIEW COMPARISON:  05/11/2018 radiograph and CT FINDINGS: Left lower lobe pneumonia. No significant effusion. Normal heart size. No pneumothorax. IMPRESSION: Left lower lobe pneumonia. Radiographic follow-up to resolution is advised. Electronically Signed   By: Jasmine PangKim  Fujinaga M.D.   On: 12/05/2018 17:09   Dg Lumbar Spine 2-3 Views  Result Date: 12/05/2018 CLINICAL DATA:  Low back pain EXAM: LUMBAR SPINE - 2-3 VIEW COMPARISON:  12/30/2004 FINDINGS: Mild levoscoliosis of the lumbar spine. Vertebral body heights are normal. Mild degenerative change L3-L4 with disc space narrowing and osteophyte. Mild spurring anteriorly at most levels. IMPRESSION: Mild scoliosis and degenerative change without acute osseous abnormality. Electronically Signed   By: Jasmine PangKim  Fujinaga M.D.   On: 12/05/2018 17:17    Pending Labs Unresulted Labs (From admission, onward)    Start     Ordered   12/05/18 1911  Troponin I - Now Then Q6H  Now then every 6 hours,   R     12/05/18 1910   12/05/18 1911  D-dimer, quantitative (not at [facility])  Add-on,   R     12/05/18 1910   12/05/18 1911  Magnesium  Add-on,   R     12/05/18 1910   12/05/18 1911  Respiratory Panel by PCR  (Respiratory virus panel with precautions)  Once,   R    Question:  Patient immune status  Answer:  Normal   12/05/18 1910   12/05/18 1911  MRSA PCR Screening  Once,   R    Question:  Patient immune status  Answer:  Normal   12/05/18 1910   Signed and Held  HIV antibody (Routine  Screening)   Tomorrow morning,   R     Signed and Held   Signed and Held  Culture, blood (routine x 2) Call MD if unable to obtain prior to antibiotics being given  BLOOD CULTURE X 2,   R    Comments:  If blood cultures drawn in Emergency Department - Do not draw and cancel order   Question:  Patient immune status  Answer:  Normal   Signed and Held   Signed and Held  Culture, sputum-assessment  Once,   R    Question:  Patient immune status  Answer:  Normal   Signed and Held   Signed and Held  Gram stain  Once,   R    Question:  Patient immune status  Answer:  Normal   Signed and Held   Signed and Held  Strep pneumoniae urinary antigen  Once,   R     Signed and Held          Vitals/Pain Today's Vitals   12/05/18 1800 12/05/18 1830 12/05/18 1900 12/05/18 1930  BP: 120/77 107/80 113/75 109/74  Pulse: 96 (!) 102 93 93  Resp: (!) 25 (!) 27 (!) 26 (!) 25  Temp:      TempSrc:      SpO2: 97% 97% 96% 96%  Weight:      Height:      PainSc:        Isolation Precautions Droplet precaution  Medications Medications - No data to display  Mobility walks

## 2018-12-05 NOTE — ED Notes (Signed)
Pt's O2 dropped to  80%, w/o oxygen, while ambulating to restroom.

## 2018-12-06 DIAGNOSIS — E440 Moderate protein-calorie malnutrition: Secondary | ICD-10-CM

## 2018-12-06 DIAGNOSIS — J181 Lobar pneumonia, unspecified organism: Principal | ICD-10-CM

## 2018-12-06 DIAGNOSIS — J9601 Acute respiratory failure with hypoxia: Secondary | ICD-10-CM

## 2018-12-06 DIAGNOSIS — J4520 Mild intermittent asthma, uncomplicated: Secondary | ICD-10-CM

## 2018-12-06 LAB — MAGNESIUM: Magnesium: 2 mg/dL (ref 1.7–2.4)

## 2018-12-06 LAB — CBC
HCT: 35.4 % — ABNORMAL LOW (ref 36.0–46.0)
Hemoglobin: 11.6 g/dL — ABNORMAL LOW (ref 12.0–15.0)
MCH: 30.9 pg (ref 26.0–34.0)
MCHC: 32.8 g/dL (ref 30.0–36.0)
MCV: 94.4 fL (ref 80.0–100.0)
Platelets: 186 10*3/uL (ref 150–400)
RBC: 3.75 MIL/uL — ABNORMAL LOW (ref 3.87–5.11)
RDW: 12.5 % (ref 11.5–15.5)
WBC: 9.5 10*3/uL (ref 4.0–10.5)
nRBC: 0 % (ref 0.0–0.2)

## 2018-12-06 LAB — COMPREHENSIVE METABOLIC PANEL
ALT: 11 U/L (ref 0–44)
AST: 18 U/L (ref 15–41)
Albumin: 3.7 g/dL (ref 3.5–5.0)
Alkaline Phosphatase: 58 U/L (ref 38–126)
Anion gap: 11 (ref 5–15)
BUN: 15 mg/dL (ref 6–20)
CALCIUM: 8.6 mg/dL — AB (ref 8.9–10.3)
CO2: 23 mmol/L (ref 22–32)
Chloride: 103 mmol/L (ref 98–111)
Creatinine, Ser: 0.65 mg/dL (ref 0.44–1.00)
GFR calc Af Amer: >60 mL/min (ref >60)
Glucose, Bld: 113 mg/dL — ABNORMAL HIGH (ref 70–99)
Potassium: 3.3 mmol/L — ABNORMAL LOW (ref 3.5–5.1)
Sodium: 137 mmol/L (ref 135–145)
TOTAL PROTEIN: 7.1 g/dL (ref 6.5–8.1)
Total Bilirubin: 1 mg/dL (ref 0.3–1.2)

## 2018-12-06 LAB — TROPONIN I
Troponin I: <0.03 ng/mL (ref <0.03)
Troponin I: <0.03 ng/mL (ref <0.03)

## 2018-12-06 LAB — HIV ANTIBODY (ROUTINE TESTING W REFLEX): HIV Screen 4th Generation wRfx: NONREACTIVE

## 2018-12-06 LAB — TSH: TSH: 0.8 u[IU]/mL (ref 0.350–4.500)

## 2018-12-06 LAB — PHOSPHORUS: Phosphorus: 3 mg/dL (ref 2.5–4.6)

## 2018-12-06 MED ORDER — ADULT MULTIVITAMIN W/MINERALS CH
1.0000 | ORAL_TABLET | Freq: Every day | ORAL | Status: DC
  Administered 2018-12-06 – 2018-12-07 (×2): 1 via ORAL
  Filled 2018-12-06 (×2): qty 1

## 2018-12-06 MED ORDER — ENSURE ENLIVE PO LIQD
237.0000 mL | Freq: Two times a day (BID) | ORAL | Status: DC
  Administered 2018-12-06 – 2018-12-07 (×4): 237 mL via ORAL

## 2018-12-06 MED ORDER — POTASSIUM CHLORIDE CRYS ER 20 MEQ PO TBCR
40.0000 meq | EXTENDED_RELEASE_TABLET | Freq: Once | ORAL | Status: AC
  Administered 2018-12-06: 40 meq via ORAL
  Filled 2018-12-06: qty 2

## 2018-12-06 MED ORDER — AZITHROMYCIN 250 MG PO TABS
500.0000 mg | ORAL_TABLET | Freq: Every day | ORAL | Status: DC
  Administered 2018-12-06: 500 mg via ORAL
  Filled 2018-12-06: qty 2

## 2018-12-06 NOTE — Progress Notes (Signed)
Initial Nutrition Assessment  DOCUMENTATION CODES:   Non-severe (moderate) malnutrition in context of chronic illness  INTERVENTION:  - Will order Ensure Enlive po BID, each supplement provides 350 kcal and 20 grams of protein - Will order daily multivitamin with minerals. - Continue to encourage PO intakes.    NUTRITION DIAGNOSIS:   Moderate Malnutrition related to social / environmental circumstances(financial constraints) as evidenced by moderate fat depletion, moderate muscle depletion.  GOAL:   Patient will meet greater than or equal to 90% of their needs  MONITOR:   PO intake, Supplement acceptance, Weight trends, Labs  REASON FOR ASSESSMENT:   Malnutrition Screening Tool, Consult Malnutrition Eval  ASSESSMENT:   62 y.o. female with medical history significant of asthma, dyslipidemia, and cognitive impairment. She presented to the ED with SOB that was progressively worsening for the few days prior to presentation. She reported weight loss associated with decreased appetite.   BMI indicates normal weight. No intakes documented since admission. Breakfast tray on bedside table with 100% completion: bowl of cereal, fruit cup, and 2 cups of juice. Patient reports that this or toast with eggs, or pancakes would be a typical breakfast for her. She will usually eat a sandwich for lunch, and make whipped potatoes and another item for dinner.   Patient likes to drink milk once/day but states that it is often too expensive to afford to do this. She does confirm that financial constraints are a barrier to eating more during the day. She is very open to receiving a protein supplement during hospitalization.  Per chart review, patient currently weighs 102 lb and it appears that weight has been stable since May 2019. Patient is unsure of UBW.    Medications reviewed; 1 mg Folvite/day, 20 mEq K-Dur x1 dose 12/11, 100 mg thiamine/day.  Labs reviewed; K: 3.3 mmol/L, Ca: 8.6 mg/dL.       NUTRITION - FOCUSED PHYSICAL EXAM:    Most Recent Value  Orbital Region  Moderate depletion  Upper Arm Region  Moderate depletion  Thoracic and Lumbar Region  Unable to assess  Buccal Region  Moderate depletion  Temple Region  Mild depletion  Clavicle Bone Region  Moderate depletion  Clavicle and Acromion Bone Region  Moderate depletion  Scapular Bone Region  Moderate depletion  Dorsal Hand  Severe depletion  Patellar Region  Moderate depletion  Anterior Thigh Region  Unable to assess  Posterior Calf Region  Severe depletion  Edema (RD Assessment)  None  Hair  Reviewed  Eyes  Reviewed  Mouth  Reviewed  Skin  Reviewed  Nails  Reviewed       Diet Order:   Diet Order            Diet Heart Room service appropriate? Yes; Fluid consistency: Thin  Diet effective now              EDUCATION NEEDS:   No education needs have been identified at this time  Skin:  Skin Assessment: Reviewed RN Assessment  Last BM:  12/11  Height:   Ht Readings from Last 1 Encounters:  12/05/18 5\' 1"  (1.549 m)    Weight:   Wt Readings from Last 1 Encounters:  12/05/18 46.3 kg    Ideal Body Weight:  47.73 kg  BMI:  Body mass index is 19.27 kg/m.  Estimated Nutritional Needs:   Kcal:  1530-1760 kcal  Protein:  55-70 grams  Fluid:  >/= 1.6 L/day     Trenton GammonJessica Derinda Bartus, MS, RD, LDN, CNSC  Inpatient Clinical Dietitian Pager # [phone] After hours/weekend pager # [phone]

## 2018-12-06 NOTE — Progress Notes (Signed)
PHARMACIST - PHYSICIAN COMMUNICATION  CONCERNING: Antibiotic IV to Oral Route Change Policy  RECOMMENDATION: This patient is receiving azithromycin by the intravenous route.  Based on criteria approved by the Pharmacy and Therapeutics Committee, the antibiotic(s) is/are being converted to the equivalent oral dose form(s).   DESCRIPTION: These criteria include:  Patient being treated for a respiratory tract infection, urinary tract infection, cellulitis or clostridium difficile associated diarrhea if on metronidazole  The patient is not neutropenic and does not exhibit a GI malabsorption state  The patient is eating (either orally or via tube) and/or has been taking other orally administered medications for a least 24 hours  The patient is improving clinically and has a Tmax < 100.5  If you have questions about this conversion, please contact the Pharmacy Department  []  ( [phone] )  [place] []  ( [phone]  [facility] []  ( [phone]  [place] []  ( [phone] )  [facility] [x]  ( [phone] )  [facility]  

## 2018-12-06 NOTE — Progress Notes (Signed)
SATURATION QUALIFICATIONS: (This note is used to comply with regulatory documentation for home oxygen)  Patient Saturations on Room Air at Rest = 96%  Patient Saturations on Room Air while Ambulating = 98%  Patient Saturations on 0 Liters of oxygen while Ambulating = 98%   

## 2018-12-06 NOTE — Progress Notes (Signed)
PROGRESS NOTE   [name] [name]  DGU:[id number]    DOB: August 07, 1956    DOA: 12/05/2018  PCP: [name], MD   I have briefly reviewed patients previous medical records in [facility].  Brief Narrative:  62 year old female, PMH of asthma, GERD, cognitive impairment, prior pneumonia, HLD, presented to the [facility] ED on 12/05/2018 with history of dyspnea, productive cough for a few days and has been using her sisters oxygen.  Associated decreased appetite and weight loss.  No smoking history.  In the ED, hypoxic to 87% on room air.  Admitted for acute hypoxic respiratory failure due to community-acquired pneumonia complicating underlying acute viral bronchitis (RSV positive).  Improving.   Assessment & Plan:   Active Problems:   Asthma, chronic   Mild intellectual disabilities   Acute respiratory failure with hypoxia (HCC)   HLD (hyperlipidemia)   CAP (community acquired pneumonia)   Hypoxia   Hypokalemia   Chest pain   Malnutrition of moderate degree   1. Community-acquired multilobar pneumonia: CTA chest was negative for PE but showed features suggestive of acute bronchitis and right middle lobe and partial bilateral lower lobe consolidation.  Continue empirically started IV ceftriaxone and azithromycin.  MRSA PCR negative. 2. Acute viral bronchitis: RSV panel positive for RSV.  Supportive treatment. 3. Acute respiratory failure with hypoxia: Secondary to multilobar pneumonia and acute viral bronchitis.  No clinical asthma exacerbation.  Oxygen supplementation.  Will need to reassess home O2 requirement prior to discharge.  THS negative for PE. 4. Asthma: No clinical bronchospasm. 5. Cognitive impairment: Mild and mental status likely at baseline. 6. Hyperlipidemia: Stable. 7. Hypokalemia: Replace and follow.  Magnesium normal. 8. Atypical chest pain: Likely related to pneumonia and acute hypoxic respiratory failure.  Resolved.  Troponin cycled and  negative.  CTA chest negative for PE. 9. Normocytic anemia: Follow CBC in a.m.   DVT prophylaxis: Lovenox Code Status: Full Family Communication: None at bedside Disposition: Home pending clinical improvement.   Consultants:  None  Procedures:  None  Antimicrobials:  IV ceftriaxone and azithromycin   Subjective: Overall feels better.  Reports that she feels stronger.  Dyspnea improved but still has DOE.  Mostly dry cough.  No chest pain.  ROS: As above, otherwise negative.  Objective:  Vitals:   12/06/18 0523 12/06/18 0835 12/06/18 1215 12/06/18 1300  BP: 101/74   111/77  Pulse: 70   87  Resp: 18   18  Temp: 97.8 F (36.6 C)   98.7 F (37.1 C)  TempSrc: Oral   Oral  SpO2: 100% 98% 98% 99%  Weight:      Height:        Examination:  General exam: Pleasant middle-aged female, moderately built and thinly nourished sitting up comfortably in chair this morning.  Did not look septic or toxic. Respiratory system: Reduced breath sounds in the bases with few bibasilar crackles.  Rest of lung fields clear to auscultation without wheezing or rhonchi. Respiratory effort normal. Cardiovascular system: S1 & S2 heard, RRR. No JVD, murmurs, rubs, gallops or clicks. No pedal edema.  Telemetry personally reviewed: Sinus rhythm. Gastrointestinal system: Abdomen is nondistended, soft and nontender. No organomegaly or masses felt. Normal bowel sounds heard. Central nervous system: Alert and oriented. No focal neurological deficits. Extremities: Symmetric 5 x 5 power. Skin: No rashes, lesions or ulcers Psychiatry: Judgement and insight appear impaired. Mood & affect appropriate.     Data Reviewed: I have personally reviewed following labs and  imaging studies  CBC: Recent Labs  Lab 12/05/18 1644 12/06/18 0054  WBC 8.1 9.5  NEUTROABS 6.5  --   HGB 12.4 11.6*  HCT 37.9 35.4*  MCV 93.3 94.4  PLT 209 186   Basic Metabolic Panel: Recent Labs  Lab 12/05/18 1644 12/06/18 0054   NA 138 137  K 3.4* 3.3*  CL 103 103  CO2 26 23  GLUCOSE 105* 113*  BUN 17 15  CREATININE 0.64 0.65  CALCIUM 8.9 8.6*  MG 2.1 2.0  PHOS  --  3.0   Liver Function Tests: Recent Labs  Lab 12/05/18 1644 12/06/18 0054  AST 20 18  ALT 14 11  ALKPHOS 68 58  BILITOT 1.1 1.0  PROT 7.5 7.1  ALBUMIN 4.3 3.7   Cardiac Enzymes: Recent Labs  Lab 12/05/18 1929 12/06/18 0053 12/06/18 0728  TROPONINI <0.03 <0.03 <0.03     Recent Results (from the past 240 hour(s))  Respiratory Panel by PCR     Status: Abnormal   Collection Time: 12/05/18  7:11 PM  Result Value Ref Range Status   Adenovirus NOT DETECTED NOT DETECTED Final   Coronavirus 229E NOT DETECTED NOT DETECTED Final   Coronavirus HKU1 NOT DETECTED NOT DETECTED Final   Coronavirus NL63 NOT DETECTED NOT DETECTED Final   Coronavirus OC43 NOT DETECTED NOT DETECTED Final   Metapneumovirus NOT DETECTED NOT DETECTED Final   Rhinovirus / Enterovirus NOT DETECTED NOT DETECTED Final   Influenza A NOT DETECTED NOT DETECTED Final   Influenza B NOT DETECTED NOT DETECTED Final   Parainfluenza Virus 1 NOT DETECTED NOT DETECTED Final   Parainfluenza Virus 2 NOT DETECTED NOT DETECTED Final   Parainfluenza Virus 3 NOT DETECTED NOT DETECTED Final   Parainfluenza Virus 4 NOT DETECTED NOT DETECTED Final   Respiratory Syncytial Virus DETECTED (A) NOT DETECTED Final    Comment: CRITICAL RESULT CALLED TO, READ BACK BY AND VERIFIED WITH: K MICHUDA RN 12/05/18 2255 JDW    Bordetella pertussis NOT DETECTED NOT DETECTED Final   Chlamydophila pneumoniae NOT DETECTED NOT DETECTED Final   Mycoplasma pneumoniae NOT DETECTED NOT DETECTED Final    Comment: Performed at [facility], [address] [place], [place] [address]  MRSA PCR Screening     Status: None   Collection Time: 12/05/18  7:11 PM  Result Value Ref Range Status   MRSA by PCR NEGATIVE NEGATIVE Final    Comment:        The GeneXpert MRSA Assay (FDA approved for NASAL  specimens only), is one component of a comprehensive MRSA colonization surveillance program. It is not intended to diagnose MRSA infection nor to guide or monitor treatment for MRSA infections. Performed at [facility], [address], [place], [place] [address]          Radiology Studies: Dg Chest 2 View  Result Date: 12/05/2018 CLINICAL DATA:  Shortness of breath EXAM: CHEST - 2 VIEW COMPARISON:  05/11/2018 radiograph and CT FINDINGS: Left lower lobe pneumonia. No significant effusion. Normal heart size. No pneumothorax. IMPRESSION: Left lower lobe pneumonia. Radiographic follow-up to resolution is advised. Electronically Signed   By: Jasmine Pang M.D.   On: 12/05/2018 17:09   Dg Lumbar Spine 2-3 Views  Result Date: 12/05/2018 CLINICAL DATA:  Low back pain EXAM: LUMBAR SPINE - 2-3 VIEW COMPARISON:  12/30/2004 FINDINGS: Mild levoscoliosis of the lumbar spine. Vertebral body heights are normal. Mild degenerative change L3-L4 with disc space narrowing and osteophyte. Mild spurring anteriorly  at most levels. IMPRESSION: Mild scoliosis and degenerative change without acute osseous abnormality. Electronically Signed   By: Jasmine PangKim  Fujinaga M.D.   On: 12/05/2018 17:17   Ct Angio Chest Pe W Or Wo Contrast  Result Date: 12/05/2018 CLINICAL DATA:  Asthma, short of breath EXAM: CT ANGIOGRAPHY CHEST WITH CONTRAST TECHNIQUE: Multidetector CT imaging of the chest was performed using the standard protocol during bolus administration of intravenous contrast. Multiplanar CT image reconstructions and MIPs were obtained to evaluate the vascular anatomy. CONTRAST:  100mL ISOVUE-370 IOPAMIDOL (ISOVUE-370) INJECTION 76% COMPARISON:  Chest x-ray 12 11 2019, CT chest 05/11/2018 FINDINGS: Cardiovascular: Satisfactory opacification of the pulmonary arteries to the segmental level. Slight motion degradation in the lower lobes. No evidence of pulmonary embolism. Normal heart size. No pericardial  effusion. Nonaneurysmal aorta. No dissection. Mild aortic atherosclerosis. Mediastinum/Nodes: Mild air distention of esophagus. Negative thyroid. No significant adenopathy. Lungs/Pleura: Small foci of ground-glass density in the right middle lobe. Peribronchial thickening within the bilateral lower lobes. Partial consolidations within the bilateral lower lobes. No pleural effusion. Upper Abdomen: No acute abnormality. Musculoskeletal: No chest wall abnormality. No acute or significant osseous findings. Review of the MIP images confirms the above findings. IMPRESSION: 1. Negative for acute pulmonary embolus or aortic dissection. 2. Peribronchial thickening within the bilateral lower lobes, likely reflecting history of asthma with superimposed central airways inflammation. Patchy foci of ground-glass density in the right middle lobe with partial bilateral lower lobe consolidation suspect for multifocal infection/pneumonia. Aortic Atherosclerosis (ICD10-I70.0). Electronically Signed   By: Jasmine PangKim  Fujinaga M.D.   On: 12/05/2018 20:56        Scheduled Meds: . azithromycin  500 mg Oral QHS  . enoxaparin (LOVENOX) injection  30 mg Subcutaneous Q24H  . feeding supplement (ENSURE ENLIVE)  237 mL Oral BID BM  . folic acid  1 mg Oral Daily  . guaiFENesin  600 mg Oral BID  . ipratropium-albuterol  3 mL Nebulization QID  . mometasone-formoterol  2 puff Inhalation BID  . multivitamin with minerals  1 tablet Oral Daily  . simvastatin  20 mg Oral QHS  . thiamine  100 mg Oral Daily   Continuous Infusions: . cefTRIAXone (ROCEPHIN)  IV 1 g (12/05/18 2205)     LOS: 1 day     Marcellus ScottAnand Urvi Imes, MD, FACP, [facility]. [facility] Pager [phone]  If 7PM-7AM, please contact night-coverage [email or web address] Password [email or web address] 12/06/2018, 3:15 PM

## 2018-12-07 DIAGNOSIS — E876 Hypokalemia: Secondary | ICD-10-CM

## 2018-12-07 LAB — CBC
HCT: 30.1 % — ABNORMAL LOW (ref 36.0–46.0)
HEMOGLOBIN: 9.5 g/dL — AB (ref 12.0–15.0)
MCH: 30.3 pg (ref 26.0–34.0)
MCHC: 31.6 g/dL (ref 30.0–36.0)
MCV: 95.9 fL (ref 80.0–100.0)
Platelets: 171 10*3/uL (ref 150–400)
RBC: 3.14 MIL/uL — AB (ref 3.87–5.11)
RDW: 12.6 % (ref 11.5–15.5)
WBC: 6.1 10*3/uL (ref 4.0–10.5)
nRBC: 0 % (ref 0.0–0.2)

## 2018-12-07 LAB — BASIC METABOLIC PANEL
Anion gap: 7 (ref 5–15)
BUN: 17 mg/dL (ref 6–20)
CHLORIDE: 108 mmol/L (ref 98–111)
CO2: 25 mmol/L (ref 22–32)
Calcium: 8.6 mg/dL — ABNORMAL LOW (ref 8.9–10.3)
Creatinine, Ser: 0.54 mg/dL (ref 0.44–1.00)
GFR calc Af Amer: >60 mL/min (ref >60)
GFR calc non Af Amer: >60 mL/min (ref >60)
Glucose, Bld: 112 mg/dL — ABNORMAL HIGH (ref 70–99)
Potassium: 4 mmol/L (ref 3.5–5.1)
Sodium: 140 mmol/L (ref 135–145)

## 2018-12-07 MED ORDER — BUDESONIDE-FORMOTEROL FUMARATE 160-4.5 MCG/ACT IN AERO: 2.0000 | INHALATION_SPRAY | Freq: Two times a day (BID) | RESPIRATORY_TRACT | 0 refills | Status: AC

## 2018-12-07 MED ORDER — ENSURE ENLIVE PO LIQD: 237.0000 mL | Freq: Two times a day (BID) | ORAL | 12 refills | Status: AC

## 2018-12-07 MED ORDER — AMOXICILLIN-POT CLAVULANATE 500-125 MG PO TABS: 1.0000 | ORAL_TABLET | Freq: Two times a day (BID) | ORAL | 0 refills | Status: AC

## 2018-12-07 MED ORDER — AMOXICILLIN-POT CLAVULANATE 500-125 MG PO TABS: 1.0000 | ORAL_TABLET | Freq: Two times a day (BID) | ORAL | Status: DC

## 2018-12-07 MED ORDER — GUAIFENESIN ER 600 MG PO TB12: 600.0000 mg | ORAL_TABLET | Freq: Two times a day (BID) | ORAL | 0 refills | Status: AC

## 2018-12-07 MED ORDER — SODIUM CHLORIDE 0.9 % IV SOLN
1.0000 g | INTRAVENOUS | Status: AC
  Administered 2018-12-07: 1 g via INTRAVENOUS
  Filled 2018-12-07: qty 1

## 2018-12-07 MED ORDER — AMOXICILLIN-POT CLAVULANATE 500-125 MG PO TABS: 1.0000 | ORAL_TABLET | Freq: Three times a day (TID) | ORAL | Status: DC

## 2018-12-07 MED ORDER — AZITHROMYCIN 250 MG PO TABS
500.0000 mg | ORAL_TABLET | Freq: Every day | ORAL | Status: AC
  Administered 2018-12-07: 500 mg via ORAL
  Filled 2018-12-07: qty 2

## 2018-12-07 MED ORDER — ALBUTEROL SULFATE HFA 108 (90 BASE) MCG/ACT IN AERS: 2.0000 | INHALATION_SPRAY | Freq: Four times a day (QID) | RESPIRATORY_TRACT | 0 refills | Status: AC | PRN

## 2018-12-07 MED ORDER — ADULT MULTIVITAMIN W/MINERALS CH: 1.0000 | ORAL_TABLET | Freq: Every day | ORAL | Status: AC

## 2018-12-07 NOTE — Discharge Instructions (Signed)

## 2018-12-07 NOTE — Progress Notes (Signed)
CSW received a call from pt's RN stating pt needs transport and that pt is ambulatory.  CSW provided a bus pass to pt's RN.  CSW will continue to follow for D/C needs.  Dorothe PeaJonathan F. Sofi Bryars, LCSW, LCAS, CSI Clinical Social Worker Ph: [phone]

## 2018-12-07 NOTE — Progress Notes (Signed)
SATURATION QUALIFICATIONS: (This note is used to comply with regulatory documentation for home oxygen)  Patient Saturations on Room Air at Rest = 97%  Patient Saturations on Room Air while Ambulating = 95%  Patient Saturations on  Liters of oxygen while Ambulating = %  Please briefly explain why patient needs home oxygen: 

## 2018-12-07 NOTE — Discharge Summary (Signed)
Physician Discharge Summary  [name] [name] JXB:[id number] DOB: 17-Nov-1956  PCP: [name], MD  Admit date: 12/05/2018 Discharge date: 12/07/2018  Recommendations for Outpatient Follow-up:  1. Dr. Hillard Danker, PCP in 1 week with repeat labs (CBC & BMP).  Please follow-up final blood culture results that were sent from the hospital. 2. Recommend repeating chest x-ray in 4 weeks to insure resolution of pneumonia findings.  Home Health: None Equipment/Devices: None  Discharge Condition: Improved and stable CODE STATUS: Full Diet recommendation: Heart healthy diet.  Discharge Diagnoses:  Active Problems:   Asthma, chronic   Mild intellectual disabilities   Acute respiratory failure with hypoxia (HCC)   HLD (hyperlipidemia)   CAP (community acquired pneumonia)   Hypoxia   Hypokalemia   Chest pain   Malnutrition of moderate degree   Brief Summary: 62 year old female, PMH of asthma, GERD, cognitive impairment, prior pneumonia, HLD, presented to the [facility] ED on 12/05/2018 with history of dyspnea, productive cough for a few days and has been using her sisters oxygen.  Associated decreased appetite and weight loss.  No smoking history.  In the ED, hypoxic to 87% on room air.  Admitted for acute hypoxic respiratory failure due to community-acquired pneumonia complicating underlying acute viral bronchitis (RSV positive).    Assessment & Plan:  1. Community-acquired multilobar pneumonia: CTA chest was negative for PE but showed features suggestive of acute bronchitis and right middle lobe and partial bilateral lower lobe consolidation. She was treated empirically IV ceftriaxone and azithromycin and completed 3 days of same.  MRSA PCR negative.  Clinically improved.  She was discharged on oral Augmentin to complete total 7 days course.  Recommend repeating chest x-ray in 4 weeks to insure resolution of pneumonia findings.  HIV screen negative. 2. Acute  viral bronchitis: RSV panel positive for RSV.  Supportive treatment.  Improved. 3. Acute respiratory failure with hypoxia: Secondary to multilobar pneumonia and acute viral bronchitis.  No clinical asthma exacerbation.  CTA chest negative for PE.  Not hypoxic on room air with activity on day of discharge. 4. Asthma: No clinical bronchospasm.  Continue home Symbicort and as needed albuterol inhaler. 5. Cognitive impairment: Mild and mental status likely at baseline.  I called patient's sister but I was unable to reach patient's sister to discuss her care. 6. Hyperlipidemia: Patient was not taking statins PTA. 7. Hypokalemia: Replaced.  Magnesium normal. 8. Atypical chest pain: Likely related to pneumonia and acute hypoxic respiratory failure.  Resolved.  Troponin cycled and negative.  CTA chest negative for PE.  No recurrence of chest pain. 9. Normocytic anemia: No overt bleeding.  Asymptomatic.  Suspect close to baseline (was 10 g range in May 2019).  Follow CBC closely during outpatient follow-up.    Consultants:  None  Procedures:  None   Discharge Instructions  Discharge Instructions    Call MD for:  difficulty breathing, headache or visual disturbances   Complete by:  As directed    Call MD for:  extreme fatigue   Complete by:  As directed    Call MD for:  persistant dizziness or light-headedness   Complete by:  As directed    Call MD for:  persistant nausea and vomiting   Complete by:  As directed    Call MD for:  severe uncontrolled pain   Complete by:  As directed    Call MD for:  temperature >100.4   Complete by:  As directed    Diet - low sodium  heart healthy   Complete by:  As directed    Increase activity slowly   Complete by:  As directed        Medication List    STOP taking these medications   ipratropium-albuterol 0.5-2.5 (3) MG/3ML Soln Commonly known as:  DUONEB   simvastatin 20 MG tablet Commonly known as:  ZOCOR     TAKE these medications    albuterol 108 (90 Base) MCG/ACT inhaler Commonly known as:  PROVENTIL HFA;VENTOLIN HFA Inhale 2 puffs into the lungs every 6 (six) hours as needed for wheezing or shortness of breath. What changed:  when to take this   amoxicillin-clavulanate 500-125 MG tablet Commonly known as:  AUGMENTIN Take 1 tablet (500 mg total) by mouth 2 (two) times daily for 4 days. Start taking on:  December 08, 2018   budesonide-formoterol 160-4.5 MCG/ACT inhaler Commonly known as:  SYMBICORT Inhale 2 puffs into the lungs 2 (two) times daily.   feeding supplement (ENSURE ENLIVE) Liqd Take 237 mLs by mouth 2 (two) times daily between meals.   guaiFENesin 600 MG 12 hr tablet Commonly known as:  MUCINEX Take 1 tablet (600 mg total) by mouth 2 (two) times daily for 7 days.   ibuprofen 200 MG tablet Commonly known as:  ADVIL,MOTRIN Take 400 mg by mouth daily as needed for headache.   multivitamin with minerals Tabs tablet Take 1 tablet by mouth daily. Start taking on:  December 08, 2018      Follow-up Information    [name], MD. Schedule an appointment as soon as possible for a visit in 1 week(s).   Specialty:  Internal Medicine Why:  To be seen with repeat labs (CBC & BMP). Contact information: [address] [place] [address] [phone]          No Known Allergies    Procedures/Studies: Dg Chest 2 View  Result Date: 12/05/2018 CLINICAL DATA:  Shortness of breath EXAM: CHEST - 2 VIEW COMPARISON:  05/11/2018 radiograph and CT FINDINGS: Left lower lobe pneumonia. No significant effusion. Normal heart size. No pneumothorax. IMPRESSION: Left lower lobe pneumonia. Radiographic follow-up to resolution is advised. Electronically Signed   By: Jasmine Pang M.D.   On: 12/05/2018 17:09   Dg Lumbar Spine 2-3 Views  Result Date: 12/05/2018 CLINICAL DATA:  Low back pain EXAM: LUMBAR SPINE - 2-3 VIEW COMPARISON:  12/30/2004 FINDINGS: Mild levoscoliosis of the lumbar spine.  Vertebral body heights are normal. Mild degenerative change L3-L4 with disc space narrowing and osteophyte. Mild spurring anteriorly at most levels. IMPRESSION: Mild scoliosis and degenerative change without acute osseous abnormality. Electronically Signed   By: Jasmine Pang M.D.   On: 12/05/2018 17:17   Ct Angio Chest Pe W Or Wo Contrast  Result Date: 12/05/2018 CLINICAL DATA:  Asthma, short of breath EXAM: CT ANGIOGRAPHY CHEST WITH CONTRAST TECHNIQUE: Multidetector CT imaging of the chest was performed using the standard protocol during bolus administration of intravenous contrast. Multiplanar CT image reconstructions and MIPs were obtained to evaluate the vascular anatomy. CONTRAST:  ISOVUE-370 IOPAMIDOL (ISOVUE-370) INJECTION 76% COMPARISON:  Chest x-ray 12 11 2019, CT chest 05/11/2018 FINDINGS: Cardiovascular: Satisfactory opacification of the pulmonary arteries to the segmental level. Slight motion degradation in the lower lobes. No evidence of pulmonary embolism. Normal heart size. No pericardial effusion. Nonaneurysmal aorta. No dissection. Mild aortic atherosclerosis. Mediastinum/Nodes: Mild air distention of esophagus. Negative thyroid. No significant adenopathy. Lungs/Pleura: Small foci of ground-glass density in the right middle lobe. Peribronchial thickening  within the bilateral lower lobes. Partial consolidations within the bilateral lower lobes. No pleural effusion. Upper Abdomen: No acute abnormality. Musculoskeletal: No chest wall abnormality. No acute or significant osseous findings. Review of the MIP images confirms the above findings. IMPRESSION: 1. Negative for acute pulmonary embolus or aortic dissection. 2. Peribronchial thickening within the bilateral lower lobes, likely reflecting history of asthma with superimposed central airways inflammation. Patchy foci of ground-glass density in the right middle lobe with partial bilateral lower lobe consolidation suspect for multifocal  infection/pneumonia. Aortic Atherosclerosis (ICD10-I70.0). Electronically Signed   By: Jasmine Pang M.D.   On: 12/05/2018 20:56      Subjective: Patient was seen on rounds this morning.  Denied complaints.  No chest pain, dyspnea reported.  Cough improved, minimal and mostly dry.  Discharge Exam:  Vitals:   12/07/18 0850 12/07/18 1147 12/07/18 1227 12/07/18 1430  BP:    109/72  Pulse:    87  Resp:    16  Temp:    98 F (36.7 C)  TempSrc:    Oral  SpO2: 99% 97% 97% 95%  Weight:      Height:        General exam: Pleasant middle-aged female, moderately built and thinly nourished sitting up comfortably in chair this morning.  Did not look septic or toxic. Respiratory system:  Occasional bibasilar crackles.  Rest of lung fields clear to auscultation without wheezing or rhonchi. Respiratory effort normal. Cardiovascular system: S1 & S2 heard, RRR. No JVD, murmurs, rubs, gallops or clicks. No pedal edema.  Gastrointestinal system: Abdomen is nondistended, soft and nontender. No organomegaly or masses felt. Normal bowel sounds heard. Central nervous system: Alert and oriented. No focal neurological deficits. Extremities: Symmetric 5 x 5 power. Skin: No rashes, lesions or ulcers Psychiatry: Judgement and insight appear impaired. Mood & affect appropriate.     The results of significant diagnostics from this hospitalization (including imaging, microbiology, ancillary and laboratory) are listed below for reference.     Microbiology: Recent Results (from the past 240 hour(s))  Respiratory Panel by PCR     Status: Abnormal   Collection Time: 12/05/18  7:11 PM  Result Value Ref Range Status   Adenovirus NOT DETECTED NOT DETECTED Final   Coronavirus 229E NOT DETECTED NOT DETECTED Final   Coronavirus HKU1 NOT DETECTED NOT DETECTED Final   Coronavirus NL63 NOT DETECTED NOT DETECTED Final   Coronavirus OC43 NOT DETECTED NOT DETECTED Final   Metapneumovirus NOT DETECTED NOT DETECTED  Final   Rhinovirus / Enterovirus NOT DETECTED NOT DETECTED Final   Influenza A NOT DETECTED NOT DETECTED Final   Influenza B NOT DETECTED NOT DETECTED Final   Parainfluenza Virus 1 NOT DETECTED NOT DETECTED Final   Parainfluenza Virus 2 NOT DETECTED NOT DETECTED Final   Parainfluenza Virus 3 NOT DETECTED NOT DETECTED Final   Parainfluenza Virus 4 NOT DETECTED NOT DETECTED Final   Respiratory Syncytial Virus DETECTED (A) NOT DETECTED Final    Comment: CRITICAL RESULT CALLED TO, READ BACK BY AND VERIFIED WITH: K MICHUDA RN 12/05/18 2255 JDW    Bordetella pertussis NOT DETECTED NOT DETECTED Final   Chlamydophila pneumoniae NOT DETECTED NOT DETECTED Final   Mycoplasma pneumoniae NOT DETECTED NOT DETECTED Final    Comment: Performed at [facility], [address], [place], [place] [address]  MRSA PCR Screening     Status: None   Collection Time: 12/05/18  7:11 PM  Result Value Ref Range Status   MRSA by  PCR NEGATIVE NEGATIVE Final    Comment:        The GeneXpert MRSA Assay (FDA approved for NASAL specimens only), is one component of a comprehensive MRSA colonization surveillance program. It is not intended to diagnose MRSA infection nor to guide or monitor treatment for MRSA infections. Performed at [facility], [address] [place], [place] [address]   Culture, blood (routine x 2) Call MD if unable to obtain prior to antibiotics being given     Status: None (Preliminary result)   Collection Time: 12/05/18  9:57 PM  Result Value Ref Range Status   Specimen Description   Final    BLOOD RIGHT HAND Performed at [facility], [address] [place], [place] [address]    Special Requests   Final    BOTTLES DRAWN AEROBIC ONLY Blood Culture adequate volume Performed at [facility], [address] [place], [place] [address]    Culture   Final    NO GROWTH 1 DAY Performed at [facility], [address] [place], [place] [address]    Report Status PENDING  Incomplete  Culture, blood (routine x 2) Call MD if unable to obtain prior to antibiotics being given     Status: None (Preliminary result)   Collection Time: 12/05/18  9:57 PM  Result Value Ref Range Status   Specimen Description   Final    BLOOD RIGHT ARM Performed at [facility], [address] [place], [place] [address]    Special Requests   Final    BOTTLES DRAWN AEROBIC ONLY Blood Culture adequate volume Performed at [facility], [address] [place], [place] [address]    Culture   Final    NO GROWTH 1 DAY Performed at [facility], [address] [place], [place] [address]    Report Status PENDING  Incomplete     Labs: CBC: Recent Labs  Lab 12/05/18 1644 12/06/18 0054 12/07/18 0548  WBC 8.1 9.5 6.1  NEUTROABS 6.5  --   --   HGB 12.4 11.6* 9.5*  HCT 37.9 35.4* 30.1*  MCV 93.3 94.4 95.9  PLT 209 186 171   Basic Metabolic Panel: Recent Labs  Lab 12/05/18 1644 12/06/18 0054 12/07/18 0548  NA 138 137 140  K 3.4* 3.3* 4.0  CL 103 103 108  CO2 26 23 25   GLUCOSE 105* 113* 112*  BUN 17 15 17   CREATININE 0.64 0.65 0.54  CALCIUM 8.9 8.6* 8.6*  MG 2.1 2.0  --   PHOS  --  3.0  --    Liver Function Tests: Recent Labs  Lab 12/05/18 1644 12/06/18 0054  AST 20 18  ALT 14 11  ALKPHOS 68 58  BILITOT 1.1 1.0  PROT 7.5 7.1  ALBUMIN 4.3 3.7   Cardiac Enzymes: Recent Labs  Lab 12/05/18 1929 12/06/18 0053 12/06/18 0728  TROPONINI <0.03 <0.03 <0.03    Thyroid function studies Recent Labs    12/06/18 0053  TSH 0.800   Urinalysis    Component Value Date/Time   COLORURINE YELLOW 12/05/2018 1703   APPEARANCEUR CLEAR 12/05/2018 1703   LABSPEC 1.021 12/05/2018 1703   PHURINE 5.0 12/05/2018 1703   GLUCOSEU NEGATIVE 12/05/2018 1703   HGBUR SMALL (A) 12/05/2018 1703   BILIRUBINUR NEGATIVE 12/05/2018 1703   KETONESUR 80 (A) 12/05/2018 1703   PROTEINUR 30  (A) 12/05/2018 1703   UROBILINOGEN 0.2 12/07/2014 0513   NITRITE  NEGATIVE 12/05/2018 1703   LEUKOCYTESUR TRACE (A) 12/05/2018 1703   Unsuccessfully attempted to reach patient's sister via phone to update care.  Phone rang without an answer and there was no answering machine to leave a voicemail.   Time coordinating discharge: 40 minutes  SIGNED:  Marcellus Scott, MD, FACP, [facility]. [facility] Pager [phone]  If 7PM-7AM, please contact night-coverage [email or web address] Password [email or web address] 12/07/2018, 2:54 PM

## 2018-12-07 NOTE — Care Management Important Message (Signed)
Important Message  Patient Details  Name: [name] [name] MRN: [id number] Date of Birth: May 29, 1956   Medicare Important Message Given:  Yes    Caren MacadamFuller, Kalyna Paolella 12/07/2018, 12:54 PMImportant Message  Patient Details  Name: [name] [name] MRN: [id number] Date of Birth: May 29, 1956   Medicare Important Message Given:  Yes    Caren MacadamFuller, Yanely Mast 12/07/2018, 12:54 PM

## 2018-12-11 LAB — CULTURE, BLOOD (ROUTINE X 2)
Culture: NO GROWTH
Culture: NO GROWTH
SPECIAL REQUESTS: ADEQUATE
Special Requests: ADEQUATE

## 2018-12-15 DIAGNOSIS — J45909 Unspecified asthma, uncomplicated: Secondary | ICD-10-CM | POA: Diagnosis not present

## 2018-12-15 DIAGNOSIS — R062 Wheezing: Secondary | ICD-10-CM | POA: Diagnosis not present

## 2019-01-15 DIAGNOSIS — J45909 Unspecified asthma, uncomplicated: Secondary | ICD-10-CM | POA: Diagnosis not present

## 2019-01-15 DIAGNOSIS — R062 Wheezing: Secondary | ICD-10-CM | POA: Diagnosis not present

## 2019-01-16 DIAGNOSIS — J45909 Unspecified asthma, uncomplicated: Secondary | ICD-10-CM | POA: Diagnosis not present

## 2019-02-15 DIAGNOSIS — J45909 Unspecified asthma, uncomplicated: Secondary | ICD-10-CM | POA: Diagnosis not present

## 2019-02-15 DIAGNOSIS — R062 Wheezing: Secondary | ICD-10-CM | POA: Diagnosis not present

## 2019-03-16 DIAGNOSIS — R062 Wheezing: Secondary | ICD-10-CM | POA: Diagnosis not present

## 2019-03-16 DIAGNOSIS — J45909 Unspecified asthma, uncomplicated: Secondary | ICD-10-CM | POA: Diagnosis not present

## 2019-04-16 DIAGNOSIS — R062 Wheezing: Secondary | ICD-10-CM | POA: Diagnosis not present

## 2019-04-16 DIAGNOSIS — J45909 Unspecified asthma, uncomplicated: Secondary | ICD-10-CM | POA: Diagnosis not present

## 2019-05-06 DIAGNOSIS — M349 Systemic sclerosis, unspecified: Secondary | ICD-10-CM | POA: Diagnosis not present

## 2019-05-06 DIAGNOSIS — Z13220 Encounter for screening for lipoid disorders: Secondary | ICD-10-CM | POA: Diagnosis not present

## 2019-05-06 DIAGNOSIS — R234 Changes in skin texture: Secondary | ICD-10-CM | POA: Diagnosis not present

## 2019-05-06 DIAGNOSIS — E559 Vitamin D deficiency, unspecified: Secondary | ICD-10-CM | POA: Diagnosis not present

## 2019-05-06 DIAGNOSIS — Z0000 Encounter for general adult medical examination without abnormal findings: Secondary | ICD-10-CM | POA: Diagnosis not present

## 2019-05-06 DIAGNOSIS — R5383 Other fatigue: Secondary | ICD-10-CM | POA: Diagnosis not present

## 2019-05-16 DIAGNOSIS — R062 Wheezing: Secondary | ICD-10-CM | POA: Diagnosis not present

## 2019-05-16 DIAGNOSIS — J45909 Unspecified asthma, uncomplicated: Secondary | ICD-10-CM | POA: Diagnosis not present

## 2019-05-20 DIAGNOSIS — Z789 Other specified health status: Secondary | ICD-10-CM | POA: Diagnosis not present

## 2019-05-20 DIAGNOSIS — D709 Neutropenia, unspecified: Secondary | ICD-10-CM | POA: Diagnosis not present

## 2019-06-15 DIAGNOSIS — R51 Headache: Secondary | ICD-10-CM | POA: Diagnosis not present

## 2019-07-29 DIAGNOSIS — F6381 Intermittent explosive disorder: Secondary | ICD-10-CM | POA: Diagnosis not present

## 2019-08-16 DIAGNOSIS — D709 Neutropenia, unspecified: Secondary | ICD-10-CM | POA: Diagnosis not present

## 2019-08-16 DIAGNOSIS — N183 Chronic kidney disease, stage 3 (moderate): Secondary | ICD-10-CM | POA: Diagnosis not present

## 2019-08-16 DIAGNOSIS — Z1231 Encounter for screening mammogram for malignant neoplasm of breast: Secondary | ICD-10-CM | POA: Diagnosis not present

## 2019-08-29 DIAGNOSIS — F6381 Intermittent explosive disorder: Secondary | ICD-10-CM | POA: Diagnosis not present

## 2020-06-12 ENCOUNTER — Encounter (HOSPITAL_COMMUNITY): Payer: Self-pay

## 2020-06-12 ENCOUNTER — Other Ambulatory Visit: Payer: Self-pay

## 2020-06-12 ENCOUNTER — Emergency Department (HOSPITAL_COMMUNITY)
Admission: EM | Admit: 2020-06-12 | Discharge: 2020-06-13 | Disposition: A | Discharge status: Home / Self Care | Payer: Medicare HMO | Attending: Emergency Medicine | Admitting: Emergency Medicine

## 2020-06-12 DIAGNOSIS — Z20822 Contact with and (suspected) exposure to covid-19: Secondary | ICD-10-CM | POA: Diagnosis not present

## 2020-06-12 DIAGNOSIS — J449 Chronic obstructive pulmonary disease, unspecified: Secondary | ICD-10-CM | POA: Diagnosis not present

## 2020-06-12 DIAGNOSIS — R4689 Other symptoms and signs involving appearance and behavior: Secondary | ICD-10-CM

## 2020-06-12 DIAGNOSIS — F918 Other conduct disorders: Secondary | ICD-10-CM | POA: Insufficient documentation

## 2020-06-12 DIAGNOSIS — Z7951 Long term (current) use of inhaled steroids: Secondary | ICD-10-CM | POA: Insufficient documentation

## 2020-06-12 DIAGNOSIS — J45909 Unspecified asthma, uncomplicated: Secondary | ICD-10-CM | POA: Insufficient documentation

## 2020-06-12 DIAGNOSIS — Z79899 Other long term (current) drug therapy: Secondary | ICD-10-CM | POA: Diagnosis not present

## 2020-06-12 DIAGNOSIS — Z59819 Housing instability, housed unspecified: Secondary | ICD-10-CM
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
## 2020-06-13 LAB — RAPID URINE DRUG SCREEN, HOSP PERFORMED
Amphetamines: NOT DETECTED
Barbiturates: NOT DETECTED
Benzodiazepines: NOT DETECTED
Cocaine: NOT DETECTED
Opiates: NOT DETECTED
Tetrahydrocannabinol: NOT DETECTED

## 2020-06-13 LAB — CBC WITH DIFFERENTIAL/PLATELET
Abs Immature Granulocytes: 0 10*3/uL (ref 0.00–0.07)
Basophils Absolute: 0 10*3/uL (ref 0.0–0.1)
Basophils Relative: 1 %
Eosinophils Absolute: 0.2 10*3/uL (ref 0.0–0.5)
Eosinophils Relative: 3 %
HCT: 41.3 % (ref 36.0–46.0)
Hemoglobin: 13.2 g/dL (ref 12.0–15.0)
Immature Granulocytes: 0 %
Lymphocytes Relative: 39 %
Lymphs Abs: 1.7 10*3/uL (ref 0.7–4.0)
MCH: 30.2 pg (ref 26.0–34.0)
MCHC: 32 g/dL (ref 30.0–36.0)
MCV: 94.5 fL (ref 80.0–100.0)
Monocytes Absolute: 0.4 10*3/uL (ref 0.1–1.0)
Monocytes Relative: 9 %
Neutro Abs: 2.1 10*3/uL (ref 1.7–7.7)
Neutrophils Relative %: 48 %
Platelets: 285 10*3/uL (ref 150–400)
RBC: 4.37 MIL/uL (ref 3.87–5.11)
RDW: 12.9 % (ref 11.5–15.5)
WBC: 4.4 10*3/uL (ref 4.0–10.5)
nRBC: 0 % (ref 0.0–0.2)

## 2020-06-13 LAB — URINALYSIS, ROUTINE W REFLEX MICROSCOPIC
Bilirubin Urine: NEGATIVE
Glucose, UA: NEGATIVE mg/dL
Hgb urine dipstick: NEGATIVE
Ketones, ur: NEGATIVE mg/dL
Leukocytes,Ua: NEGATIVE
Nitrite: NEGATIVE
Protein, ur: NEGATIVE mg/dL
Specific Gravity, Urine: 1.02 (ref 1.005–1.030)
pH: 5 (ref 5.0–8.0)

## 2020-06-13 LAB — COMPREHENSIVE METABOLIC PANEL
ALT: 16 U/L (ref 0–44)
AST: 25 U/L (ref 15–41)
Albumin: 4.6 g/dL (ref 3.5–5.0)
Alkaline Phosphatase: 73 U/L (ref 38–126)
Anion gap: 11 (ref 5–15)
BUN: 17 mg/dL (ref 8–23)
CO2: 24 mmol/L (ref 22–32)
Calcium: 9.3 mg/dL (ref 8.9–10.3)
Chloride: 108 mmol/L (ref 98–111)
Creatinine, Ser: 0.72 mg/dL (ref 0.44–1.00)
GFR calc Af Amer: >60 mL/min (ref >60)
GFR calc non Af Amer: >60 mL/min (ref >60)
Glucose, Bld: 100 mg/dL — ABNORMAL HIGH (ref 70–99)
Potassium: 3.4 mmol/L — ABNORMAL LOW (ref 3.5–5.1)
Sodium: 143 mmol/L (ref 135–145)
Total Bilirubin: 1.1 mg/dL (ref 0.3–1.2)
Total Protein: 7.8 g/dL (ref 6.5–8.1)

## 2020-06-13 LAB — ACETAMINOPHEN LEVEL: Acetaminophen (Tylenol), Serum: <10 ug/mL — ABNORMAL LOW (ref 10–30)

## 2020-06-13 LAB — SALICYLATE LEVEL: Salicylate Lvl: <7 mg/dL — ABNORMAL LOW (ref 7.0–30.0)

## 2020-06-13 LAB — SARS CORONAVIRUS 2 BY RT PCR (HOSPITAL ORDER, PERFORMED IN ~~LOC~~ HOSPITAL LAB): SARS Coronavirus 2: NEGATIVE

## 2020-06-13 LAB — ETHANOL: Alcohol, Ethyl (B): <10 mg/dL (ref <10)
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
## 2020-08-25 ENCOUNTER — Emergency Department (HOSPITAL_COMMUNITY): Payer: Medicare HMO

## 2020-08-25 ENCOUNTER — Encounter (HOSPITAL_COMMUNITY): Payer: Self-pay | Admitting: Emergency Medicine

## 2020-08-25 ENCOUNTER — Emergency Department (HOSPITAL_COMMUNITY)
Admission: EM | Admit: 2020-08-25 | Discharge: 2020-08-26 | Disposition: A | Discharge status: Home / Self Care | Payer: Medicare HMO | Attending: Emergency Medicine | Admitting: Emergency Medicine

## 2020-08-25 DIAGNOSIS — J449 Chronic obstructive pulmonary disease, unspecified: Secondary | ICD-10-CM | POA: Insufficient documentation

## 2020-08-25 DIAGNOSIS — Y999 Unspecified external cause status: Secondary | ICD-10-CM | POA: Diagnosis not present

## 2020-08-25 DIAGNOSIS — S50312A Abrasion of left elbow, initial encounter: Secondary | ICD-10-CM | POA: Diagnosis not present

## 2020-08-25 DIAGNOSIS — S7002XA Contusion of left hip, initial encounter: Secondary | ICD-10-CM | POA: Diagnosis not present

## 2020-08-25 DIAGNOSIS — Y92481 Parking lot as the place of occurrence of the external cause: Secondary | ICD-10-CM | POA: Insufficient documentation

## 2020-08-25 DIAGNOSIS — Z79899 Other long term (current) drug therapy: Secondary | ICD-10-CM | POA: Diagnosis not present

## 2020-08-25 DIAGNOSIS — Z23 Encounter for immunization: Secondary | ICD-10-CM | POA: Diagnosis not present

## 2020-08-25 DIAGNOSIS — Y9389 Activity, other specified: Secondary | ICD-10-CM | POA: Diagnosis not present

## 2020-08-25 DIAGNOSIS — J45909 Unspecified asthma, uncomplicated: Secondary | ICD-10-CM | POA: Insufficient documentation

## 2020-08-25 DIAGNOSIS — Z7951 Long term (current) use of inhaled steroids: Secondary | ICD-10-CM | POA: Insufficient documentation

## 2020-08-25 DIAGNOSIS — S79912A Unspecified injury of left hip, initial encounter: Secondary | ICD-10-CM | POA: Diagnosis present
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
## 2020-08-26 ENCOUNTER — Emergency Department (HOSPITAL_COMMUNITY): Payer: Medicare HMO

## 2020-08-26 ENCOUNTER — Other Ambulatory Visit: Payer: Self-pay

## 2020-08-26 DIAGNOSIS — S7002XA Contusion of left hip, initial encounter: Secondary | ICD-10-CM | POA: Diagnosis not present

## 2020-08-26 MED ORDER — HYDROCODONE-ACETAMINOPHEN 5-325 MG PO TABS: 1.0000 | ORAL_TABLET | ORAL | 0 refills | Status: AC | PRN

## 2020-08-26 MED ORDER — TETANUS-DIPHTH-ACELL PERTUSSIS 5-2.5-18.5 LF-MCG/0.5 IM SUSP
0.5000 mL | Freq: Once | INTRAMUSCULAR | Status: AC
  Administered 2020-08-26: 0.5 mL via INTRAMUSCULAR
  Filled 2020-08-26: qty 0.5

## 2020-08-26 MED ORDER — HYDROCODONE-ACETAMINOPHEN 5-325 MG PO TABS
1.0000 | ORAL_TABLET | Freq: Once | ORAL | Status: AC
  Administered 2020-08-26: 1 via ORAL
  Filled 2020-08-26: qty 1
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
## 2020-08-26 NOTE — ED Notes (Signed)
Skin tear to the outer Left arm was dressed and covered, crutches being given to pt by ortho tech at this time, D/C papers has been given, explained & understood.

## 2020-08-26 NOTE — Progress Notes (Signed)
Orthopedic Tech Progress Note Patient Details:  [name] [name] 1956-07-12 [id number]  Ortho Devices Type of Ortho Device: Crutches Ortho Device/Splint Interventions: Ordered, Application, Adjustment   Post Interventions Patient Tolerated: Ambulated well, Well Instructions Provided: Poper ambulation with device, Care of device   [name] 08/26/2020, 1:03 PM

## 2020-08-26 NOTE — Discharge Instructions (Signed)
If your pain is out of control, you are unable to walk, or develop any other new/concerning symptoms then return to the ER for evaluation.
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
## 2020-08-26 NOTE — ED Notes (Signed)
Patient transported to X-ray 

## 2021-03-01 ENCOUNTER — Emergency Department (HOSPITAL_COMMUNITY)
Admission: EM | Admit: 2021-03-01 | Discharge: 2021-03-01 | Disposition: A | Discharge status: Home / Self Care | Payer: Medicare HMO | Attending: Emergency Medicine | Admitting: Emergency Medicine

## 2021-03-01 ENCOUNTER — Emergency Department (HOSPITAL_COMMUNITY): Payer: Medicare HMO

## 2021-03-01 ENCOUNTER — Encounter (HOSPITAL_COMMUNITY): Payer: Self-pay

## 2021-03-01 DIAGNOSIS — W1830XA Fall on same level, unspecified, initial encounter: Secondary | ICD-10-CM | POA: Diagnosis not present

## 2021-03-01 DIAGNOSIS — J441 Chronic obstructive pulmonary disease with (acute) exacerbation: Secondary | ICD-10-CM | POA: Insufficient documentation

## 2021-03-01 DIAGNOSIS — S82032A Displaced transverse fracture of left patella, initial encounter for closed fracture: Secondary | ICD-10-CM | POA: Insufficient documentation

## 2021-03-01 DIAGNOSIS — J45909 Unspecified asthma, uncomplicated: Secondary | ICD-10-CM | POA: Diagnosis not present

## 2021-03-01 DIAGNOSIS — Z7951 Long term (current) use of inhaled steroids: Secondary | ICD-10-CM | POA: Diagnosis not present

## 2021-03-01 DIAGNOSIS — S8992XA Unspecified injury of left lower leg, initial encounter: Secondary | ICD-10-CM | POA: Diagnosis present

## 2021-03-01 NOTE — ED Provider Notes (Signed)
Patient seen after prior ED provider.  Case discussed with Dr. Eulah Pont -- covering for orthopedics.   Patient to be discharged.  Patient will be followed up by Dr. Eulah Pont in his outpatient clinic on Wednesday.  Knee immobilizer recommended for outpatient use.  Patient and patient's family at bedside understand plan of care.  Importance of close follow-up stressed.  Strict return precautions given and understood.   Wynetta Fines, MD 03/01/21 1725

## 2021-03-01 NOTE — ED Triage Notes (Signed)
Pt presents with c/o fall onto the left knee that occurred yesterday.

## 2021-03-01 NOTE — Discharge Instructions (Signed)
Please return for any problem.  Call Dr. Greig Right office tomorrow for appointment on Wednesday.  Use knee immobilizer as instructed.

## 2021-03-01 NOTE — ED Provider Notes (Signed)
[facility]-EMERGENCY DEPT Provider Note   CSN: [id number] Arrival date & time: 03/01/21  1428     History Chief Complaint  Patient presents with  . Fall    [name] [name] is a 65 y.o. female.  The history is provided by the patient and medical records. No language interpreter was used.  Knee Pain Location:  Knee Time since incident:  1 day Injury: yes   Mechanism of injury: fall   Fall:    Fall occurred:  Standing   Impact surface:  Hard floor   Entrapped after fall: no   Knee location:  L knee Pain details:    Quality:  Aching   Radiates to:  Does not radiate   Severity:  Moderate   Onset quality:  Sudden   Timing:  Constant   Progression:  Unchanged Chronicity:  New Dislocation: no   Tetanus status:  Unknown Worsened by:  Bearing weight and activity Ineffective treatments:  None tried Associated symptoms: swelling   Associated symptoms: no back pain, no decreased ROM, no fatigue, no fever, no itching, no muscle weakness, no neck pain, no numbness, no stiffness and no tingling        Past Medical History:  Diagnosis Date  . Acute bronchitis   . Anemia, unspecified   . Asthma   . Esophageal reflux   . Mild intellectual disabilities   . Other acute reactions to stress   . Pneumonia    went to [facility] from 06/01/18-06/05/18  . Pneumonia, organism unspecified(486)   . Unspecified asthma(493.90)     Patient Active Problem List   Diagnosis Date Noted  . Malnutrition of moderate degree 12/06/2018  . CAP (community acquired pneumonia) 12/05/2018  . Hypoxia 12/05/2018  . Hypokalemia 12/05/2018  . Chest pain 12/05/2018  . COPD exacerbation (HCC)   . Acute respiratory failure with hypoxia (HCC) 05/11/2018  . Asthma exacerbation 05/11/2018  . Sepsis (HCC) 05/11/2018  . HLD (hyperlipidemia) 05/11/2018  . Mild intellectual disabilities   . Routine general medical examination at a health care facility 11/10/2016  . Iron deficiency  anemia 12/16/2014  . Asthma, chronic 04/10/2013    Past Surgical History:  Procedure Laterality Date  . TONSILLECTOMY       OB History   No obstetric history on file.     Family History  Problem Relation Age of Onset  . Colon cancer Father   . Asthma Sister   . Alzheimer's disease Mother     Social History   Tobacco Use  . Smoking status: Never Smoker  . Smokeless tobacco: Never Used  Vaping Use  . Vaping Use: Never used  Substance Use Topics  . Alcohol use: No  . Drug use: No    Home Medications Prior to Admission medications   Medication Sig Start Date End Date Taking? Authorizing Provider  albuterol (PROVENTIL HFA;VENTOLIN HFA) 108 (90 Base) MCG/ACT inhaler Inhale 2 puffs into the lungs every 6 (six) hours as needed for wheezing or shortness of breath. 12/07/18   Hongalgi, Maximino Greenland, MD  budesonide-formoterol (SYMBICORT) 160-4.5 MCG/ACT inhaler Inhale 2 puffs into the lungs 2 (two) times daily. 12/07/18   Hongalgi, Maximino Greenland, MD  feeding supplement, ENSURE ENLIVE, (ENSURE ENLIVE) LIQD Take 237 mLs by mouth 2 (two) times daily between meals. 12/07/18   Hongalgi, Maximino Greenland, MD  HYDROcodone-acetaminophen (NORCO) 5-325 MG tablet Take 1 tablet by mouth every 4 (four) hours as needed for severe pain. 08/26/20   Pricilla Loveless, MD  ibuprofen (ADVIL,MOTRIN) 200 MG tablet Take 400 mg by mouth daily as needed for headache.    [provider]  Multiple Vitamin (MULTIVITAMIN WITH MINERALS) TABS tablet Take 1 tablet by mouth daily. 12/08/18   Hongalgi, Maximino Greenland, MD    Allergies    Patient has no known allergies.  Review of Systems   Review of Systems  Constitutional: Negative for chills, diaphoresis, fatigue and fever.  HENT: Negative for congestion.   Eyes: Negative for visual disturbance.  Respiratory: Negative for cough, chest tightness, shortness of breath and wheezing.   Cardiovascular: Negative for chest pain, palpitations and leg swelling.  Gastrointestinal:  Negative for constipation, diarrhea and nausea.  Genitourinary: Negative for dysuria.  Musculoskeletal: Positive for joint swelling. Negative for back pain, neck pain, neck stiffness and stiffness.  Skin: Positive for color change (bruising). Negative for itching and wound.  Neurological: Negative for dizziness, light-headedness and headaches.  Psychiatric/Behavioral: Negative for agitation and confusion.    Physical Exam Updated Vital Signs BP 124/81 (BP Location: Right Arm)   Pulse 88   Temp 98.6 F (37 C) (Oral)   Resp 18   SpO2 95%   Physical Exam Vitals and nursing note reviewed.  Constitutional:      General: She is not in acute distress.    Appearance: She is well-developed and well-nourished. She is not ill-appearing, toxic-appearing or diaphoretic.  HENT:     Head: Normocephalic and atraumatic.     Mouth/Throat:     Mouth: Mucous membranes are moist.  Eyes:     Conjunctiva/sclera: Conjunctivae normal.  Cardiovascular:     Rate and Rhythm: Normal rate and regular rhythm.     Heart sounds: No murmur heard.   Pulmonary:     Effort: Pulmonary effort is normal. No respiratory distress.     Breath sounds: Normal breath sounds. No wheezing, rhonchi or rales.  Chest:     Chest wall: No tenderness.  Abdominal:     General: Abdomen is flat.     Palpations: Abdomen is soft.     Tenderness: There is no abdominal tenderness. There is no right CVA tenderness, left CVA tenderness, guarding or rebound.  Musculoskeletal:        General: Swelling and signs of injury present. No edema.     Cervical back: Neck supple. No tenderness.     Left knee: Swelling present. No deformity, erythema or crepitus. Normal range of motion. Tenderness present. Normal pulse.     Right lower leg: No edema.     Left lower leg: No edema.       Legs:  Skin:    General: Skin is warm and dry.     Capillary Refill: Capillary refill takes less than 2 seconds.     Findings: Bruising present. No  erythema or rash.  Neurological:     General: No focal deficit present.     Mental Status: She is alert.  Psychiatric:        Mood and Affect: Mood and affect and mood normal.     ED Results / Procedures / Treatments   Labs (all labs ordered are listed, but only abnormal results are displayed) Labs Reviewed - No data to display  EKG None  Radiology DG Knee Complete 4 Views Left  Result Date: 03/01/2021 CLINICAL DATA:  Left knee pain, fell EXAM: LEFT KNEE - COMPLETE 4+ VIEW COMPARISON:  08/26/2020 FINDINGS: Frontal, bilateral oblique, lateral views of the left knee demonstrate a minimally displaced transverse fracture through  the upper pole of the patella. There is a large joint effusion. No other acute bony abnormalities. Alignment is anatomic. Mild medial compartmental osteoarthritis. IMPRESSION: 1. Minimally displaced transverse fracture through the upper pole of the patella. 2. Large joint effusion. 3. Mild medial compartmental osteoarthritis. Electronically Signed   By: Sharlet Salina M.D.   On: 03/01/2021 15:54    Procedures Procedures   Medications Ordered in ED Medications - No data to display  ED Course  I have reviewed the triage vital signs and the nursing notes.  Pertinent labs & imaging results that were available during my care of the patient were reviewed by me and considered in my medical decision making (see chart for details).    MDM Rules/Calculators/A&P                          [name] [name] is a 65 y.o. female with a past medical history significant for asthma, iron deficiency anemia, COPD, hyperlipidemia, and GERD who presents with left knee pain after a fall.  Patient reports that last night, she was carrying something in her kitchen when she slipped and fell hitting her left knee on the ground.  She reports that she tried using ice and a knee brace intermittently that did not seem to help.  She reports of the swollen and hurting and want to make sure  nothing was broken.  She is still able to ambulate with pain.  She denies any numbness or weakness distally.  Denies any other complaints or injuries.  Denies any hip pain, back pain, or headache.  No chest pain or abdominal pain.  No numbness or tingling reported.  On exam, patient has tenderness and swelling to the left knee.  She was able to bend it both actively and passively with some pain.  She had intact DP and PT pulse distally and had intact sensation and strength.  No erythema or laceration seen but she does have some bruising.  No tenderness in the mid thigh or hip area.  No back tenderness or abdominal tenderness.  Lungs clear and chest nontender.  Clinically I suspect soft tissue injury to the left knee however given the swelling, will get x-rays.  If x-ray does not show acute fracture dislocation, anticipate discharge with plans to follow-up with orthopedics for more venous imaging as an outpatient for possible soft tissue, ligamentous, or tendinous injury with a direct injury to the knee.  X-ray does show patellar fracture.  It is minimally displaced through the upper pole of the patella.  There is also a large joint effusion.  Will speak with orthopedics to get disposition.  While awaiting orthopedic callback, care was transferred to Dr. Rodena Medin.  Anticipate disposition based on orthopedic recommendations   Final Clinical Impression(s) / ED Diagnoses Final diagnoses:  Closed displaced transverse fracture of left patella, initial encounter     Clinical Impression: 1. Closed displaced transverse fracture of left patella, initial encounter     Disposition: Care transferred to oncoming team awaiting for orthopedic recommendations.  This note was prepared with assistance of Conservation officer, historic buildings. Occasional wrong-word or sound-a-like substitutions may have occurred due to the inherent limitations of voice recognition software.      Shykeem Resurreccion, Canary Brim,  MD 03/01/21 [phone]

## 2021-03-01 NOTE — ED Notes (Signed)
An After Visit Summary was printed and given to the patient. Discharge instructions given and no further questions at this time.  Pt leaving with family.  

## 2021-03-01 NOTE — Progress Notes (Signed)
Orthopedic Tech Progress Note Patient Details:  [name] [name] Jul 05, 1956 [id number]  Ortho Devices Ortho Device/Splint Location: LLE knee immobilizer Ortho Device/Splint Interventions: Ordered,Application   Post Interventions Patient Tolerated: Well Instructions Provided: Care of device   Jennye Moccasin 03/01/2021, 6:01 PM

## 2021-07-14 ENCOUNTER — Emergency Department (HOSPITAL_COMMUNITY): Payer: Medicare HMO

## 2021-07-14 ENCOUNTER — Encounter (HOSPITAL_COMMUNITY): Payer: Self-pay | Admitting: Emergency Medicine

## 2021-07-14 ENCOUNTER — Emergency Department (HOSPITAL_COMMUNITY)
Admission: EM | Admit: 2021-07-14 | Discharge: 2021-07-14 | Disposition: A | Discharge status: Home / Self Care | Payer: Medicare HMO | Attending: Emergency Medicine | Admitting: Emergency Medicine

## 2021-07-14 DIAGNOSIS — S99922A Unspecified injury of left foot, initial encounter: Secondary | ICD-10-CM | POA: Insufficient documentation

## 2021-07-14 DIAGNOSIS — Y92410 Unspecified street and highway as the place of occurrence of the external cause: Secondary | ICD-10-CM | POA: Diagnosis not present

## 2021-07-14 DIAGNOSIS — J45909 Unspecified asthma, uncomplicated: Secondary | ICD-10-CM | POA: Diagnosis not present

## 2021-07-14 DIAGNOSIS — J449 Chronic obstructive pulmonary disease, unspecified: Secondary | ICD-10-CM | POA: Diagnosis not present

## 2021-07-14 DIAGNOSIS — T1490XA Injury, unspecified, initial encounter: Secondary | ICD-10-CM

## 2021-07-14 MED ORDER — OXYCODONE-ACETAMINOPHEN 5-325 MG PO TABS
1.0000 | ORAL_TABLET | Freq: Once | ORAL | Status: AC
  Administered 2021-07-14: 1 via ORAL
  Filled 2021-07-14: qty 1
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
## 2022-02-17 ENCOUNTER — Inpatient Hospital Stay (HOSPITAL_COMMUNITY): Admission: RE | Admit: 2022-02-17 | Payer: Medicare Other | Source: Home / Self Care | Admitting: Psychiatry

## 2022-02-17 ENCOUNTER — Ambulatory Visit (HOSPITAL_COMMUNITY)
Admission: AD | Admit: 2022-02-17 | Discharge: 2022-02-17 | Disposition: A | Discharge status: Home / Self Care | Payer: Medicare Other | Attending: Psychiatry | Admitting: Psychiatry
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
## 2022-07-06 ENCOUNTER — Emergency Department (HOSPITAL_COMMUNITY)
Admission: EM | Admit: 2022-07-06 | Discharge: 2022-07-07 | Disposition: A | Discharge status: Home / Self Care | Payer: Medicare Other | Attending: Emergency Medicine | Admitting: Emergency Medicine

## 2022-07-06 ENCOUNTER — Other Ambulatory Visit: Payer: Self-pay

## 2022-07-06 ENCOUNTER — Encounter (HOSPITAL_COMMUNITY): Payer: Self-pay | Admitting: Emergency Medicine

## 2022-07-06 DIAGNOSIS — S0001XA Abrasion of scalp, initial encounter: Secondary | ICD-10-CM | POA: Insufficient documentation

## 2022-07-06 DIAGNOSIS — J45909 Unspecified asthma, uncomplicated: Secondary | ICD-10-CM | POA: Insufficient documentation

## 2022-07-06 DIAGNOSIS — T148XXA Other injury of unspecified body region, initial encounter: Secondary | ICD-10-CM

## 2022-07-06 DIAGNOSIS — S3991XA Unspecified injury of abdomen, initial encounter: Secondary | ICD-10-CM | POA: Diagnosis present

## 2022-07-06 DIAGNOSIS — S301XXA Contusion of abdominal wall, initial encounter: Secondary | ICD-10-CM | POA: Insufficient documentation
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
## 2022-07-07 ENCOUNTER — Emergency Department (HOSPITAL_COMMUNITY): Payer: Medicare Other

## 2022-07-07 DIAGNOSIS — S301XXA Contusion of abdominal wall, initial encounter: Secondary | ICD-10-CM | POA: Diagnosis not present

## 2022-07-07 LAB — CBC WITH DIFFERENTIAL/PLATELET
Abs Immature Granulocytes: 0.01 10*3/uL (ref 0.00–0.07)
Basophils Absolute: 0 10*3/uL (ref 0.0–0.1)
Basophils Relative: 0 %
Eosinophils Absolute: 0.1 10*3/uL (ref 0.0–0.5)
Eosinophils Relative: 2 %
HCT: 33.4 % — ABNORMAL LOW (ref 36.0–46.0)
Hemoglobin: 10.7 g/dL — ABNORMAL LOW (ref 12.0–15.0)
Immature Granulocytes: 0 %
Lymphocytes Relative: 26 %
Lymphs Abs: 1.5 10*3/uL (ref 0.7–4.0)
MCH: 30 pg (ref 26.0–34.0)
MCHC: 32 g/dL (ref 30.0–36.0)
MCV: 93.6 fL (ref 80.0–100.0)
Monocytes Absolute: 0.4 10*3/uL (ref 0.1–1.0)
Monocytes Relative: 7 %
Neutro Abs: 3.7 10*3/uL (ref 1.7–7.7)
Neutrophils Relative %: 65 %
Platelets: 245 10*3/uL (ref 150–400)
RBC: 3.57 MIL/uL — ABNORMAL LOW (ref 3.87–5.11)
RDW: 12.4 % (ref 11.5–15.5)
WBC: 5.7 10*3/uL (ref 4.0–10.5)
nRBC: 0 % (ref 0.0–0.2)

## 2022-07-07 LAB — COMPREHENSIVE METABOLIC PANEL
ALT: 38 U/L (ref 0–44)
AST: 38 U/L (ref 15–41)
Albumin: 3.8 g/dL (ref 3.5–5.0)
Alkaline Phosphatase: 62 U/L (ref 38–126)
Anion gap: 5 (ref 5–15)
BUN: 15 mg/dL (ref 8–23)
CO2: 24 mmol/L (ref 22–32)
Calcium: 8.7 mg/dL — ABNORMAL LOW (ref 8.9–10.3)
Chloride: 111 mmol/L (ref 98–111)
Creatinine, Ser: 0.65 mg/dL (ref 0.44–1.00)
GFR, Estimated: >60 mL/min (ref >60)
Glucose, Bld: 101 mg/dL — ABNORMAL HIGH (ref 70–99)
Potassium: 3.6 mmol/L (ref 3.5–5.1)
Sodium: 140 mmol/L (ref 135–145)
Total Bilirubin: 0.5 mg/dL (ref 0.3–1.2)
Total Protein: 6.5 g/dL (ref 6.5–8.1)

## 2022-07-07 MED ORDER — IOHEXOL 300 MG/ML  SOLN
80.0000 mL | Freq: Once | INTRAMUSCULAR | Status: AC | PRN
  Administered 2022-07-07: 80 mL via INTRAVENOUS
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
## 2023-03-27 DEATH — deceased
